# Patient Record
Sex: Male | Born: 1945
Health system: Southern US, Community
[De-identification: ages and names within clinical notes are randomized; demographics above are authoritative.]

## PROBLEM LIST (undated history)

## (undated) DIAGNOSIS — F32A Depression, unspecified: Secondary | ICD-10-CM

## (undated) DIAGNOSIS — J45909 Unspecified asthma, uncomplicated: Secondary | ICD-10-CM

## (undated) DIAGNOSIS — I255 Ischemic cardiomyopathy: Secondary | ICD-10-CM

## (undated) DIAGNOSIS — Z789 Other specified health status: Secondary | ICD-10-CM

## (undated) DIAGNOSIS — N183 Chronic kidney disease, stage 3 unspecified: Secondary | ICD-10-CM

## (undated) DIAGNOSIS — R7303 Prediabetes: Secondary | ICD-10-CM

## (undated) DIAGNOSIS — D303 Benign neoplasm of bladder: Secondary | ICD-10-CM

## (undated) HISTORY — PX: TONSILLECTOMY: SUR1361

## (undated) HISTORY — PX: FOOT SURGERY: SHX648

## (undated) HISTORY — PX: CORONARY ANGIOPLASTY WITH STENT PLACEMENT: SHX49

## (undated) HISTORY — DX: Ischemic cardiomyopathy: I25.5

---

## 2000-01-02 DIAGNOSIS — I951 Orthostatic hypotension: Secondary | ICD-10-CM

## 2000-01-02 DIAGNOSIS — I2510 Atherosclerotic heart disease of native coronary artery without angina pectoris: Secondary | ICD-10-CM

## 2000-01-02 DIAGNOSIS — I219 Acute myocardial infarction, unspecified: Secondary | ICD-10-CM

## 2000-01-02 HISTORY — DX: Acute myocardial infarction, unspecified: I21.9

## 2000-01-02 HISTORY — DX: Orthostatic hypotension: I95.1

## 2000-01-02 HISTORY — DX: Atherosclerotic heart disease of native coronary artery without angina pectoris: I25.10

## 2007-08-01 DIAGNOSIS — M25569 Pain in unspecified knee: Secondary | ICD-10-CM | POA: Insufficient documentation

## 2016-01-04 DIAGNOSIS — J3089 Other allergic rhinitis: Secondary | ICD-10-CM | POA: Diagnosis not present

## 2016-01-04 DIAGNOSIS — J301 Allergic rhinitis due to pollen: Secondary | ICD-10-CM | POA: Diagnosis not present

## 2016-01-04 DIAGNOSIS — J3081 Allergic rhinitis due to animal (cat) (dog) hair and dander: Secondary | ICD-10-CM | POA: Diagnosis not present

## 2016-01-05 DIAGNOSIS — M1711 Unilateral primary osteoarthritis, right knee: Secondary | ICD-10-CM | POA: Diagnosis not present

## 2016-01-13 DIAGNOSIS — T63461D Toxic effect of venom of wasps, accidental (unintentional), subsequent encounter: Secondary | ICD-10-CM | POA: Diagnosis not present

## 2016-01-13 DIAGNOSIS — M1711 Unilateral primary osteoarthritis, right knee: Secondary | ICD-10-CM | POA: Diagnosis not present

## 2016-01-13 DIAGNOSIS — J3089 Other allergic rhinitis: Secondary | ICD-10-CM | POA: Diagnosis not present

## 2016-01-13 DIAGNOSIS — J301 Allergic rhinitis due to pollen: Secondary | ICD-10-CM | POA: Diagnosis not present

## 2016-01-13 DIAGNOSIS — T63451D Toxic effect of venom of hornets, accidental (unintentional), subsequent encounter: Secondary | ICD-10-CM | POA: Diagnosis not present

## 2016-01-13 DIAGNOSIS — J3081 Allergic rhinitis due to animal (cat) (dog) hair and dander: Secondary | ICD-10-CM | POA: Diagnosis not present

## 2016-01-19 DIAGNOSIS — M1711 Unilateral primary osteoarthritis, right knee: Secondary | ICD-10-CM | POA: Diagnosis not present

## 2016-01-26 DIAGNOSIS — M1711 Unilateral primary osteoarthritis, right knee: Secondary | ICD-10-CM | POA: Diagnosis not present

## 2016-01-30 DIAGNOSIS — J3081 Allergic rhinitis due to animal (cat) (dog) hair and dander: Secondary | ICD-10-CM | POA: Diagnosis not present

## 2016-01-30 DIAGNOSIS — J3089 Other allergic rhinitis: Secondary | ICD-10-CM | POA: Diagnosis not present

## 2016-01-30 DIAGNOSIS — T63451D Toxic effect of venom of hornets, accidental (unintentional), subsequent encounter: Secondary | ICD-10-CM | POA: Diagnosis not present

## 2016-01-30 DIAGNOSIS — T63461D Toxic effect of venom of wasps, accidental (unintentional), subsequent encounter: Secondary | ICD-10-CM | POA: Diagnosis not present

## 2016-01-30 DIAGNOSIS — J301 Allergic rhinitis due to pollen: Secondary | ICD-10-CM | POA: Diagnosis not present

## 2016-02-07 DIAGNOSIS — J3089 Other allergic rhinitis: Secondary | ICD-10-CM | POA: Diagnosis not present

## 2016-02-07 DIAGNOSIS — J301 Allergic rhinitis due to pollen: Secondary | ICD-10-CM | POA: Diagnosis not present

## 2016-02-07 DIAGNOSIS — J3081 Allergic rhinitis due to animal (cat) (dog) hair and dander: Secondary | ICD-10-CM | POA: Diagnosis not present

## 2016-02-14 DIAGNOSIS — T63451D Toxic effect of venom of hornets, accidental (unintentional), subsequent encounter: Secondary | ICD-10-CM | POA: Diagnosis not present

## 2016-02-14 DIAGNOSIS — J301 Allergic rhinitis due to pollen: Secondary | ICD-10-CM | POA: Diagnosis not present

## 2016-02-14 DIAGNOSIS — J3089 Other allergic rhinitis: Secondary | ICD-10-CM | POA: Diagnosis not present

## 2016-02-14 DIAGNOSIS — J3081 Allergic rhinitis due to animal (cat) (dog) hair and dander: Secondary | ICD-10-CM | POA: Diagnosis not present

## 2016-02-14 DIAGNOSIS — T63461D Toxic effect of venom of wasps, accidental (unintentional), subsequent encounter: Secondary | ICD-10-CM | POA: Diagnosis not present

## 2016-03-08 DIAGNOSIS — T63461D Toxic effect of venom of wasps, accidental (unintentional), subsequent encounter: Secondary | ICD-10-CM | POA: Diagnosis not present

## 2016-03-08 DIAGNOSIS — J301 Allergic rhinitis due to pollen: Secondary | ICD-10-CM | POA: Diagnosis not present

## 2016-03-08 DIAGNOSIS — J3089 Other allergic rhinitis: Secondary | ICD-10-CM | POA: Diagnosis not present

## 2016-03-08 DIAGNOSIS — J3081 Allergic rhinitis due to animal (cat) (dog) hair and dander: Secondary | ICD-10-CM | POA: Diagnosis not present

## 2016-03-08 DIAGNOSIS — T63451D Toxic effect of venom of hornets, accidental (unintentional), subsequent encounter: Secondary | ICD-10-CM | POA: Diagnosis not present

## 2016-03-13 DIAGNOSIS — T63451D Toxic effect of venom of hornets, accidental (unintentional), subsequent encounter: Secondary | ICD-10-CM | POA: Diagnosis not present

## 2016-03-13 DIAGNOSIS — T63461D Toxic effect of venom of wasps, accidental (unintentional), subsequent encounter: Secondary | ICD-10-CM | POA: Diagnosis not present

## 2016-03-14 DIAGNOSIS — E291 Testicular hypofunction: Secondary | ICD-10-CM | POA: Diagnosis not present

## 2016-03-14 DIAGNOSIS — I2510 Atherosclerotic heart disease of native coronary artery without angina pectoris: Secondary | ICD-10-CM | POA: Diagnosis not present

## 2016-03-14 DIAGNOSIS — F329 Major depressive disorder, single episode, unspecified: Secondary | ICD-10-CM | POA: Diagnosis not present

## 2016-03-14 DIAGNOSIS — N183 Chronic kidney disease, stage 3 (moderate): Secondary | ICD-10-CM | POA: Diagnosis not present

## 2016-03-15 DIAGNOSIS — J3089 Other allergic rhinitis: Secondary | ICD-10-CM | POA: Diagnosis not present

## 2016-03-15 DIAGNOSIS — J301 Allergic rhinitis due to pollen: Secondary | ICD-10-CM | POA: Diagnosis not present

## 2016-03-15 DIAGNOSIS — T63461D Toxic effect of venom of wasps, accidental (unintentional), subsequent encounter: Secondary | ICD-10-CM | POA: Diagnosis not present

## 2016-03-15 DIAGNOSIS — T63451D Toxic effect of venom of hornets, accidental (unintentional), subsequent encounter: Secondary | ICD-10-CM | POA: Diagnosis not present

## 2016-03-15 DIAGNOSIS — J3081 Allergic rhinitis due to animal (cat) (dog) hair and dander: Secondary | ICD-10-CM | POA: Diagnosis not present

## 2016-04-04 DIAGNOSIS — F329 Major depressive disorder, single episode, unspecified: Secondary | ICD-10-CM | POA: Diagnosis not present

## 2016-04-04 DIAGNOSIS — E291 Testicular hypofunction: Secondary | ICD-10-CM | POA: Diagnosis not present

## 2016-04-04 DIAGNOSIS — D126 Benign neoplasm of colon, unspecified: Secondary | ICD-10-CM | POA: Diagnosis not present

## 2016-04-04 DIAGNOSIS — G609 Hereditary and idiopathic neuropathy, unspecified: Secondary | ICD-10-CM | POA: Diagnosis not present

## 2016-04-04 DIAGNOSIS — N183 Chronic kidney disease, stage 3 (moderate): Secondary | ICD-10-CM | POA: Diagnosis not present

## 2016-04-04 DIAGNOSIS — E782 Mixed hyperlipidemia: Secondary | ICD-10-CM | POA: Diagnosis not present

## 2016-04-04 DIAGNOSIS — I2510 Atherosclerotic heart disease of native coronary artery without angina pectoris: Secondary | ICD-10-CM | POA: Diagnosis not present

## 2016-04-04 DIAGNOSIS — G4733 Obstructive sleep apnea (adult) (pediatric): Secondary | ICD-10-CM | POA: Diagnosis not present

## 2016-04-05 DIAGNOSIS — N411 Chronic prostatitis: Secondary | ICD-10-CM | POA: Diagnosis not present

## 2016-04-05 DIAGNOSIS — N401 Enlarged prostate with lower urinary tract symptoms: Secondary | ICD-10-CM | POA: Diagnosis not present

## 2016-04-09 DIAGNOSIS — N419 Inflammatory disease of prostate, unspecified: Secondary | ICD-10-CM | POA: Diagnosis not present

## 2016-04-09 DIAGNOSIS — R5381 Other malaise: Secondary | ICD-10-CM | POA: Diagnosis not present

## 2016-04-09 DIAGNOSIS — R5383 Other fatigue: Secondary | ICD-10-CM | POA: Diagnosis not present

## 2016-04-09 DIAGNOSIS — I951 Orthostatic hypotension: Secondary | ICD-10-CM | POA: Diagnosis not present

## 2016-04-09 DIAGNOSIS — N179 Acute kidney failure, unspecified: Secondary | ICD-10-CM | POA: Diagnosis not present

## 2016-04-26 DIAGNOSIS — N401 Enlarged prostate with lower urinary tract symptoms: Secondary | ICD-10-CM | POA: Diagnosis not present

## 2016-04-26 DIAGNOSIS — N411 Chronic prostatitis: Secondary | ICD-10-CM | POA: Diagnosis not present

## 2016-05-03 DIAGNOSIS — N529 Male erectile dysfunction, unspecified: Secondary | ICD-10-CM | POA: Diagnosis not present

## 2016-05-03 DIAGNOSIS — I2510 Atherosclerotic heart disease of native coronary artery without angina pectoris: Secondary | ICD-10-CM | POA: Diagnosis not present

## 2016-05-03 DIAGNOSIS — E782 Mixed hyperlipidemia: Secondary | ICD-10-CM | POA: Diagnosis not present

## 2016-05-03 DIAGNOSIS — N183 Chronic kidney disease, stage 3 (moderate): Secondary | ICD-10-CM | POA: Diagnosis not present

## 2016-05-15 DIAGNOSIS — H25813 Combined forms of age-related cataract, bilateral: Secondary | ICD-10-CM | POA: Diagnosis not present

## 2016-05-15 DIAGNOSIS — J3081 Allergic rhinitis due to animal (cat) (dog) hair and dander: Secondary | ICD-10-CM | POA: Diagnosis not present

## 2016-05-15 DIAGNOSIS — J301 Allergic rhinitis due to pollen: Secondary | ICD-10-CM | POA: Diagnosis not present

## 2016-05-15 DIAGNOSIS — H33321 Round hole, right eye: Secondary | ICD-10-CM | POA: Diagnosis not present

## 2016-05-15 DIAGNOSIS — T63461D Toxic effect of venom of wasps, accidental (unintentional), subsequent encounter: Secondary | ICD-10-CM | POA: Diagnosis not present

## 2016-05-15 DIAGNOSIS — T63451D Toxic effect of venom of hornets, accidental (unintentional), subsequent encounter: Secondary | ICD-10-CM | POA: Diagnosis not present

## 2016-05-15 DIAGNOSIS — J3089 Other allergic rhinitis: Secondary | ICD-10-CM | POA: Diagnosis not present

## 2016-05-18 DIAGNOSIS — J301 Allergic rhinitis due to pollen: Secondary | ICD-10-CM | POA: Diagnosis not present

## 2016-05-18 DIAGNOSIS — J3089 Other allergic rhinitis: Secondary | ICD-10-CM | POA: Diagnosis not present

## 2016-05-18 DIAGNOSIS — T63461D Toxic effect of venom of wasps, accidental (unintentional), subsequent encounter: Secondary | ICD-10-CM | POA: Diagnosis not present

## 2016-05-18 DIAGNOSIS — J3081 Allergic rhinitis due to animal (cat) (dog) hair and dander: Secondary | ICD-10-CM | POA: Diagnosis not present

## 2016-05-18 DIAGNOSIS — T63451D Toxic effect of venom of hornets, accidental (unintentional), subsequent encounter: Secondary | ICD-10-CM | POA: Diagnosis not present

## 2016-05-25 DIAGNOSIS — J3081 Allergic rhinitis due to animal (cat) (dog) hair and dander: Secondary | ICD-10-CM | POA: Diagnosis not present

## 2016-05-25 DIAGNOSIS — J3089 Other allergic rhinitis: Secondary | ICD-10-CM | POA: Diagnosis not present

## 2016-05-25 DIAGNOSIS — J301 Allergic rhinitis due to pollen: Secondary | ICD-10-CM | POA: Diagnosis not present

## 2016-05-30 DIAGNOSIS — J301 Allergic rhinitis due to pollen: Secondary | ICD-10-CM | POA: Diagnosis not present

## 2016-05-30 DIAGNOSIS — J3081 Allergic rhinitis due to animal (cat) (dog) hair and dander: Secondary | ICD-10-CM | POA: Diagnosis not present

## 2016-05-30 DIAGNOSIS — J3089 Other allergic rhinitis: Secondary | ICD-10-CM | POA: Diagnosis not present

## 2016-05-31 DIAGNOSIS — Z87891 Personal history of nicotine dependence: Secondary | ICD-10-CM | POA: Diagnosis not present

## 2016-05-31 DIAGNOSIS — I252 Old myocardial infarction: Secondary | ICD-10-CM | POA: Diagnosis not present

## 2016-05-31 DIAGNOSIS — N419 Inflammatory disease of prostate, unspecified: Secondary | ICD-10-CM | POA: Diagnosis not present

## 2016-05-31 DIAGNOSIS — R3912 Poor urinary stream: Secondary | ICD-10-CM | POA: Diagnosis not present

## 2016-05-31 DIAGNOSIS — N411 Chronic prostatitis: Secondary | ICD-10-CM | POA: Diagnosis not present

## 2016-05-31 DIAGNOSIS — Z8551 Personal history of malignant neoplasm of bladder: Secondary | ICD-10-CM | POA: Diagnosis not present

## 2016-05-31 DIAGNOSIS — N138 Other obstructive and reflux uropathy: Secondary | ICD-10-CM | POA: Diagnosis not present

## 2016-05-31 DIAGNOSIS — E7800 Pure hypercholesterolemia, unspecified: Secondary | ICD-10-CM | POA: Diagnosis not present

## 2016-05-31 DIAGNOSIS — N401 Enlarged prostate with lower urinary tract symptoms: Secondary | ICD-10-CM | POA: Diagnosis not present

## 2016-06-01 DIAGNOSIS — N411 Chronic prostatitis: Secondary | ICD-10-CM | POA: Diagnosis not present

## 2016-06-01 DIAGNOSIS — N138 Other obstructive and reflux uropathy: Secondary | ICD-10-CM | POA: Diagnosis not present

## 2016-06-01 DIAGNOSIS — E7800 Pure hypercholesterolemia, unspecified: Secondary | ICD-10-CM | POA: Diagnosis not present

## 2016-06-01 DIAGNOSIS — Z8551 Personal history of malignant neoplasm of bladder: Secondary | ICD-10-CM | POA: Diagnosis not present

## 2016-06-01 DIAGNOSIS — N401 Enlarged prostate with lower urinary tract symptoms: Secondary | ICD-10-CM | POA: Diagnosis not present

## 2016-06-01 DIAGNOSIS — R3912 Poor urinary stream: Secondary | ICD-10-CM | POA: Diagnosis not present

## 2016-06-05 DIAGNOSIS — T63461D Toxic effect of venom of wasps, accidental (unintentional), subsequent encounter: Secondary | ICD-10-CM | POA: Diagnosis not present

## 2016-06-05 DIAGNOSIS — J3081 Allergic rhinitis due to animal (cat) (dog) hair and dander: Secondary | ICD-10-CM | POA: Diagnosis not present

## 2016-06-05 DIAGNOSIS — T63451D Toxic effect of venom of hornets, accidental (unintentional), subsequent encounter: Secondary | ICD-10-CM | POA: Diagnosis not present

## 2016-06-05 DIAGNOSIS — J301 Allergic rhinitis due to pollen: Secondary | ICD-10-CM | POA: Diagnosis not present

## 2016-06-05 DIAGNOSIS — J3089 Other allergic rhinitis: Secondary | ICD-10-CM | POA: Diagnosis not present

## 2016-06-07 DIAGNOSIS — T63451D Toxic effect of venom of hornets, accidental (unintentional), subsequent encounter: Secondary | ICD-10-CM | POA: Diagnosis not present

## 2016-06-07 DIAGNOSIS — J3089 Other allergic rhinitis: Secondary | ICD-10-CM | POA: Diagnosis not present

## 2016-06-07 DIAGNOSIS — T63461D Toxic effect of venom of wasps, accidental (unintentional), subsequent encounter: Secondary | ICD-10-CM | POA: Diagnosis not present

## 2016-06-07 DIAGNOSIS — J3081 Allergic rhinitis due to animal (cat) (dog) hair and dander: Secondary | ICD-10-CM | POA: Diagnosis not present

## 2016-06-07 DIAGNOSIS — J301 Allergic rhinitis due to pollen: Secondary | ICD-10-CM | POA: Diagnosis not present

## 2016-07-05 DIAGNOSIS — J3081 Allergic rhinitis due to animal (cat) (dog) hair and dander: Secondary | ICD-10-CM | POA: Diagnosis not present

## 2016-07-05 DIAGNOSIS — J301 Allergic rhinitis due to pollen: Secondary | ICD-10-CM | POA: Diagnosis not present

## 2016-07-05 DIAGNOSIS — R030 Elevated blood-pressure reading, without diagnosis of hypertension: Secondary | ICD-10-CM | POA: Diagnosis not present

## 2016-07-05 DIAGNOSIS — T63451D Toxic effect of venom of hornets, accidental (unintentional), subsequent encounter: Secondary | ICD-10-CM | POA: Diagnosis not present

## 2016-07-05 DIAGNOSIS — S0012XA Contusion of left eyelid and periocular area, initial encounter: Secondary | ICD-10-CM | POA: Diagnosis not present

## 2016-07-05 DIAGNOSIS — J3089 Other allergic rhinitis: Secondary | ICD-10-CM | POA: Diagnosis not present

## 2016-07-05 DIAGNOSIS — T63461D Toxic effect of venom of wasps, accidental (unintentional), subsequent encounter: Secondary | ICD-10-CM | POA: Diagnosis not present

## 2016-07-17 DIAGNOSIS — J301 Allergic rhinitis due to pollen: Secondary | ICD-10-CM | POA: Diagnosis not present

## 2016-07-17 DIAGNOSIS — J3089 Other allergic rhinitis: Secondary | ICD-10-CM | POA: Diagnosis not present

## 2016-07-17 DIAGNOSIS — J3081 Allergic rhinitis due to animal (cat) (dog) hair and dander: Secondary | ICD-10-CM | POA: Diagnosis not present

## 2016-07-31 DIAGNOSIS — T63451D Toxic effect of venom of hornets, accidental (unintentional), subsequent encounter: Secondary | ICD-10-CM | POA: Diagnosis not present

## 2016-07-31 DIAGNOSIS — J301 Allergic rhinitis due to pollen: Secondary | ICD-10-CM | POA: Diagnosis not present

## 2016-07-31 DIAGNOSIS — T63461D Toxic effect of venom of wasps, accidental (unintentional), subsequent encounter: Secondary | ICD-10-CM | POA: Diagnosis not present

## 2016-07-31 DIAGNOSIS — J4520 Mild intermittent asthma, uncomplicated: Secondary | ICD-10-CM | POA: Diagnosis not present

## 2016-07-31 DIAGNOSIS — J3089 Other allergic rhinitis: Secondary | ICD-10-CM | POA: Diagnosis not present

## 2016-07-31 DIAGNOSIS — J3081 Allergic rhinitis due to animal (cat) (dog) hair and dander: Secondary | ICD-10-CM | POA: Diagnosis not present

## 2016-08-22 DIAGNOSIS — R030 Elevated blood-pressure reading, without diagnosis of hypertension: Secondary | ICD-10-CM | POA: Diagnosis not present

## 2016-08-22 DIAGNOSIS — F329 Major depressive disorder, single episode, unspecified: Secondary | ICD-10-CM | POA: Diagnosis not present

## 2016-08-29 DIAGNOSIS — J3081 Allergic rhinitis due to animal (cat) (dog) hair and dander: Secondary | ICD-10-CM | POA: Diagnosis not present

## 2016-08-29 DIAGNOSIS — T63451D Toxic effect of venom of hornets, accidental (unintentional), subsequent encounter: Secondary | ICD-10-CM | POA: Diagnosis not present

## 2016-08-29 DIAGNOSIS — J3089 Other allergic rhinitis: Secondary | ICD-10-CM | POA: Diagnosis not present

## 2016-08-29 DIAGNOSIS — T63461D Toxic effect of venom of wasps, accidental (unintentional), subsequent encounter: Secondary | ICD-10-CM | POA: Diagnosis not present

## 2016-08-29 DIAGNOSIS — J301 Allergic rhinitis due to pollen: Secondary | ICD-10-CM | POA: Diagnosis not present

## 2016-09-11 DIAGNOSIS — J301 Allergic rhinitis due to pollen: Secondary | ICD-10-CM | POA: Diagnosis not present

## 2016-09-11 DIAGNOSIS — T63461D Toxic effect of venom of wasps, accidental (unintentional), subsequent encounter: Secondary | ICD-10-CM | POA: Diagnosis not present

## 2016-09-11 DIAGNOSIS — T63451D Toxic effect of venom of hornets, accidental (unintentional), subsequent encounter: Secondary | ICD-10-CM | POA: Diagnosis not present

## 2016-09-11 DIAGNOSIS — J3089 Other allergic rhinitis: Secondary | ICD-10-CM | POA: Diagnosis not present

## 2016-09-11 DIAGNOSIS — J3081 Allergic rhinitis due to animal (cat) (dog) hair and dander: Secondary | ICD-10-CM | POA: Diagnosis not present

## 2016-09-13 DIAGNOSIS — J301 Allergic rhinitis due to pollen: Secondary | ICD-10-CM | POA: Diagnosis not present

## 2016-09-13 DIAGNOSIS — J3089 Other allergic rhinitis: Secondary | ICD-10-CM | POA: Diagnosis not present

## 2016-09-13 DIAGNOSIS — J3081 Allergic rhinitis due to animal (cat) (dog) hair and dander: Secondary | ICD-10-CM | POA: Diagnosis not present

## 2016-09-13 DIAGNOSIS — T63451D Toxic effect of venom of hornets, accidental (unintentional), subsequent encounter: Secondary | ICD-10-CM | POA: Diagnosis not present

## 2016-09-13 DIAGNOSIS — T63461D Toxic effect of venom of wasps, accidental (unintentional), subsequent encounter: Secondary | ICD-10-CM | POA: Diagnosis not present

## 2016-09-21 DIAGNOSIS — J3089 Other allergic rhinitis: Secondary | ICD-10-CM | POA: Diagnosis not present

## 2016-09-21 DIAGNOSIS — J3081 Allergic rhinitis due to animal (cat) (dog) hair and dander: Secondary | ICD-10-CM | POA: Diagnosis not present

## 2016-09-21 DIAGNOSIS — J301 Allergic rhinitis due to pollen: Secondary | ICD-10-CM | POA: Diagnosis not present

## 2016-09-24 DIAGNOSIS — T63461D Toxic effect of venom of wasps, accidental (unintentional), subsequent encounter: Secondary | ICD-10-CM | POA: Diagnosis not present

## 2016-09-24 DIAGNOSIS — T63451D Toxic effect of venom of hornets, accidental (unintentional), subsequent encounter: Secondary | ICD-10-CM | POA: Diagnosis not present

## 2016-10-01 DIAGNOSIS — Z955 Presence of coronary angioplasty implant and graft: Secondary | ICD-10-CM | POA: Diagnosis not present

## 2016-10-01 DIAGNOSIS — G4733 Obstructive sleep apnea (adult) (pediatric): Secondary | ICD-10-CM | POA: Diagnosis not present

## 2016-10-01 DIAGNOSIS — N183 Chronic kidney disease, stage 3 (moderate): Secondary | ICD-10-CM | POA: Diagnosis not present

## 2016-10-01 DIAGNOSIS — Z79899 Other long term (current) drug therapy: Secondary | ICD-10-CM | POA: Diagnosis not present

## 2016-10-01 DIAGNOSIS — I2510 Atherosclerotic heart disease of native coronary artery without angina pectoris: Secondary | ICD-10-CM | POA: Diagnosis not present

## 2016-10-01 DIAGNOSIS — T7840XA Allergy, unspecified, initial encounter: Secondary | ICD-10-CM | POA: Diagnosis not present

## 2016-10-01 DIAGNOSIS — I252 Old myocardial infarction: Secondary | ICD-10-CM | POA: Diagnosis not present

## 2016-10-01 DIAGNOSIS — Z87891 Personal history of nicotine dependence: Secondary | ICD-10-CM | POA: Diagnosis not present

## 2016-10-01 DIAGNOSIS — E782 Mixed hyperlipidemia: Secondary | ICD-10-CM | POA: Diagnosis not present

## 2016-10-01 DIAGNOSIS — L509 Urticaria, unspecified: Secondary | ICD-10-CM | POA: Diagnosis not present

## 2016-10-01 DIAGNOSIS — E291 Testicular hypofunction: Secondary | ICD-10-CM | POA: Diagnosis not present

## 2016-10-10 DIAGNOSIS — C61 Malignant neoplasm of prostate: Secondary | ICD-10-CM | POA: Diagnosis not present

## 2016-10-10 DIAGNOSIS — Z1159 Encounter for screening for other viral diseases: Secondary | ICD-10-CM | POA: Diagnosis not present

## 2016-10-10 DIAGNOSIS — N183 Chronic kidney disease, stage 3 (moderate): Secondary | ICD-10-CM | POA: Diagnosis not present

## 2016-10-10 DIAGNOSIS — Z0000 Encounter for general adult medical examination without abnormal findings: Secondary | ICD-10-CM | POA: Diagnosis not present

## 2016-10-10 DIAGNOSIS — E782 Mixed hyperlipidemia: Secondary | ICD-10-CM | POA: Diagnosis not present

## 2016-10-10 DIAGNOSIS — G4733 Obstructive sleep apnea (adult) (pediatric): Secondary | ICD-10-CM | POA: Diagnosis not present

## 2016-10-10 DIAGNOSIS — R7301 Impaired fasting glucose: Secondary | ICD-10-CM | POA: Diagnosis not present

## 2016-10-10 DIAGNOSIS — E291 Testicular hypofunction: Secondary | ICD-10-CM | POA: Diagnosis not present

## 2016-10-10 DIAGNOSIS — F329 Major depressive disorder, single episode, unspecified: Secondary | ICD-10-CM | POA: Diagnosis not present

## 2016-10-10 DIAGNOSIS — I2510 Atherosclerotic heart disease of native coronary artery without angina pectoris: Secondary | ICD-10-CM | POA: Diagnosis not present

## 2016-10-16 DIAGNOSIS — N410 Acute prostatitis: Secondary | ICD-10-CM | POA: Diagnosis not present

## 2016-10-16 DIAGNOSIS — N401 Enlarged prostate with lower urinary tract symptoms: Secondary | ICD-10-CM | POA: Diagnosis not present

## 2016-10-16 DIAGNOSIS — J301 Allergic rhinitis due to pollen: Secondary | ICD-10-CM | POA: Diagnosis not present

## 2016-10-16 DIAGNOSIS — J3089 Other allergic rhinitis: Secondary | ICD-10-CM | POA: Diagnosis not present

## 2016-10-16 DIAGNOSIS — Z125 Encounter for screening for malignant neoplasm of prostate: Secondary | ICD-10-CM | POA: Diagnosis not present

## 2016-10-16 DIAGNOSIS — J3081 Allergic rhinitis due to animal (cat) (dog) hair and dander: Secondary | ICD-10-CM | POA: Diagnosis not present

## 2016-10-31 DIAGNOSIS — T63451D Toxic effect of venom of hornets, accidental (unintentional), subsequent encounter: Secondary | ICD-10-CM | POA: Diagnosis not present

## 2016-10-31 DIAGNOSIS — J301 Allergic rhinitis due to pollen: Secondary | ICD-10-CM | POA: Diagnosis not present

## 2016-10-31 DIAGNOSIS — J3081 Allergic rhinitis due to animal (cat) (dog) hair and dander: Secondary | ICD-10-CM | POA: Diagnosis not present

## 2016-10-31 DIAGNOSIS — J3089 Other allergic rhinitis: Secondary | ICD-10-CM | POA: Diagnosis not present

## 2016-10-31 DIAGNOSIS — T63461D Toxic effect of venom of wasps, accidental (unintentional), subsequent encounter: Secondary | ICD-10-CM | POA: Diagnosis not present

## 2016-11-20 DIAGNOSIS — T63461D Toxic effect of venom of wasps, accidental (unintentional), subsequent encounter: Secondary | ICD-10-CM | POA: Diagnosis not present

## 2016-11-20 DIAGNOSIS — J3089 Other allergic rhinitis: Secondary | ICD-10-CM | POA: Diagnosis not present

## 2016-11-20 DIAGNOSIS — J3081 Allergic rhinitis due to animal (cat) (dog) hair and dander: Secondary | ICD-10-CM | POA: Diagnosis not present

## 2016-11-20 DIAGNOSIS — T63451D Toxic effect of venom of hornets, accidental (unintentional), subsequent encounter: Secondary | ICD-10-CM | POA: Diagnosis not present

## 2016-11-20 DIAGNOSIS — J301 Allergic rhinitis due to pollen: Secondary | ICD-10-CM | POA: Diagnosis not present

## 2016-11-21 DIAGNOSIS — J3089 Other allergic rhinitis: Secondary | ICD-10-CM | POA: Diagnosis not present

## 2016-11-21 DIAGNOSIS — J3081 Allergic rhinitis due to animal (cat) (dog) hair and dander: Secondary | ICD-10-CM | POA: Diagnosis not present

## 2016-11-21 DIAGNOSIS — J301 Allergic rhinitis due to pollen: Secondary | ICD-10-CM | POA: Diagnosis not present

## 2016-11-28 DIAGNOSIS — J3081 Allergic rhinitis due to animal (cat) (dog) hair and dander: Secondary | ICD-10-CM | POA: Diagnosis not present

## 2016-11-28 DIAGNOSIS — J3089 Other allergic rhinitis: Secondary | ICD-10-CM | POA: Diagnosis not present

## 2016-11-28 DIAGNOSIS — T63461D Toxic effect of venom of wasps, accidental (unintentional), subsequent encounter: Secondary | ICD-10-CM | POA: Diagnosis not present

## 2016-11-28 DIAGNOSIS — J301 Allergic rhinitis due to pollen: Secondary | ICD-10-CM | POA: Diagnosis not present

## 2016-11-28 DIAGNOSIS — T63451D Toxic effect of venom of hornets, accidental (unintentional), subsequent encounter: Secondary | ICD-10-CM | POA: Diagnosis not present

## 2016-12-14 DIAGNOSIS — T63451D Toxic effect of venom of hornets, accidental (unintentional), subsequent encounter: Secondary | ICD-10-CM | POA: Diagnosis not present

## 2016-12-14 DIAGNOSIS — J3081 Allergic rhinitis due to animal (cat) (dog) hair and dander: Secondary | ICD-10-CM | POA: Diagnosis not present

## 2016-12-14 DIAGNOSIS — T63461D Toxic effect of venom of wasps, accidental (unintentional), subsequent encounter: Secondary | ICD-10-CM | POA: Diagnosis not present

## 2016-12-14 DIAGNOSIS — J301 Allergic rhinitis due to pollen: Secondary | ICD-10-CM | POA: Diagnosis not present

## 2016-12-14 DIAGNOSIS — J3089 Other allergic rhinitis: Secondary | ICD-10-CM | POA: Diagnosis not present

## 2016-12-20 DIAGNOSIS — T63461D Toxic effect of venom of wasps, accidental (unintentional), subsequent encounter: Secondary | ICD-10-CM | POA: Diagnosis not present

## 2016-12-20 DIAGNOSIS — J3081 Allergic rhinitis due to animal (cat) (dog) hair and dander: Secondary | ICD-10-CM | POA: Diagnosis not present

## 2016-12-20 DIAGNOSIS — T63451D Toxic effect of venom of hornets, accidental (unintentional), subsequent encounter: Secondary | ICD-10-CM | POA: Diagnosis not present

## 2016-12-20 DIAGNOSIS — J3089 Other allergic rhinitis: Secondary | ICD-10-CM | POA: Diagnosis not present

## 2016-12-20 DIAGNOSIS — J301 Allergic rhinitis due to pollen: Secondary | ICD-10-CM | POA: Diagnosis not present

## 2017-01-23 DIAGNOSIS — L821 Other seborrheic keratosis: Secondary | ICD-10-CM | POA: Diagnosis not present

## 2017-01-23 DIAGNOSIS — Z872 Personal history of diseases of the skin and subcutaneous tissue: Secondary | ICD-10-CM | POA: Diagnosis not present

## 2017-01-23 DIAGNOSIS — L280 Lichen simplex chronicus: Secondary | ICD-10-CM | POA: Diagnosis not present

## 2017-01-23 DIAGNOSIS — D485 Neoplasm of uncertain behavior of skin: Secondary | ICD-10-CM | POA: Diagnosis not present

## 2017-01-23 DIAGNOSIS — L110 Acquired keratosis follicularis: Secondary | ICD-10-CM | POA: Diagnosis not present

## 2017-01-23 DIAGNOSIS — L853 Xerosis cutis: Secondary | ICD-10-CM | POA: Diagnosis not present

## 2017-01-23 DIAGNOSIS — D235 Other benign neoplasm of skin of trunk: Secondary | ICD-10-CM | POA: Diagnosis not present

## 2017-01-23 DIAGNOSIS — L570 Actinic keratosis: Secondary | ICD-10-CM | POA: Diagnosis not present

## 2017-01-23 DIAGNOSIS — Z09 Encounter for follow-up examination after completed treatment for conditions other than malignant neoplasm: Secondary | ICD-10-CM | POA: Diagnosis not present

## 2017-01-24 DIAGNOSIS — N451 Epididymitis: Secondary | ICD-10-CM | POA: Diagnosis not present

## 2017-01-24 DIAGNOSIS — N411 Chronic prostatitis: Secondary | ICD-10-CM | POA: Diagnosis not present

## 2017-01-24 DIAGNOSIS — N401 Enlarged prostate with lower urinary tract symptoms: Secondary | ICD-10-CM | POA: Diagnosis not present

## 2017-01-25 DIAGNOSIS — J069 Acute upper respiratory infection, unspecified: Secondary | ICD-10-CM | POA: Diagnosis not present

## 2017-04-25 DIAGNOSIS — X32XXXA Exposure to sunlight, initial encounter: Secondary | ICD-10-CM | POA: Diagnosis not present

## 2017-04-25 DIAGNOSIS — L859 Epidermal thickening, unspecified: Secondary | ICD-10-CM | POA: Diagnosis not present

## 2017-04-25 DIAGNOSIS — Z1283 Encounter for screening for malignant neoplasm of skin: Secondary | ICD-10-CM | POA: Diagnosis not present

## 2017-04-25 DIAGNOSIS — L820 Inflamed seborrheic keratosis: Secondary | ICD-10-CM | POA: Diagnosis not present

## 2017-04-25 DIAGNOSIS — L738 Other specified follicular disorders: Secondary | ICD-10-CM | POA: Diagnosis not present

## 2017-04-25 DIAGNOSIS — L570 Actinic keratosis: Secondary | ICD-10-CM | POA: Diagnosis not present

## 2017-04-25 DIAGNOSIS — D2339 Other benign neoplasm of skin of other parts of face: Secondary | ICD-10-CM | POA: Diagnosis not present

## 2017-05-08 DIAGNOSIS — E291 Testicular hypofunction: Secondary | ICD-10-CM | POA: Diagnosis not present

## 2017-05-08 DIAGNOSIS — I25110 Atherosclerotic heart disease of native coronary artery with unstable angina pectoris: Secondary | ICD-10-CM | POA: Diagnosis not present

## 2017-05-08 DIAGNOSIS — F331 Major depressive disorder, recurrent, moderate: Secondary | ICD-10-CM | POA: Diagnosis not present

## 2017-05-08 DIAGNOSIS — Z6836 Body mass index (BMI) 36.0-36.9, adult: Secondary | ICD-10-CM | POA: Diagnosis not present

## 2017-07-29 DIAGNOSIS — Z6836 Body mass index (BMI) 36.0-36.9, adult: Secondary | ICD-10-CM | POA: Diagnosis not present

## 2017-07-29 DIAGNOSIS — E291 Testicular hypofunction: Secondary | ICD-10-CM | POA: Diagnosis not present

## 2017-07-29 DIAGNOSIS — I25110 Atherosclerotic heart disease of native coronary artery with unstable angina pectoris: Secondary | ICD-10-CM | POA: Diagnosis not present

## 2017-07-29 DIAGNOSIS — F331 Major depressive disorder, recurrent, moderate: Secondary | ICD-10-CM | POA: Diagnosis not present

## 2017-08-19 DIAGNOSIS — F331 Major depressive disorder, recurrent, moderate: Secondary | ICD-10-CM | POA: Diagnosis not present

## 2017-08-19 DIAGNOSIS — E291 Testicular hypofunction: Secondary | ICD-10-CM | POA: Diagnosis not present

## 2017-08-19 DIAGNOSIS — I25110 Atherosclerotic heart disease of native coronary artery with unstable angina pectoris: Secondary | ICD-10-CM | POA: Diagnosis not present

## 2017-08-19 DIAGNOSIS — R7301 Impaired fasting glucose: Secondary | ICD-10-CM | POA: Diagnosis not present

## 2017-08-19 DIAGNOSIS — N183 Chronic kidney disease, stage 3 (moderate): Secondary | ICD-10-CM | POA: Diagnosis not present

## 2017-08-19 DIAGNOSIS — J45909 Unspecified asthma, uncomplicated: Secondary | ICD-10-CM | POA: Diagnosis not present

## 2017-08-19 DIAGNOSIS — Z6836 Body mass index (BMI) 36.0-36.9, adult: Secondary | ICD-10-CM | POA: Diagnosis not present

## 2017-08-19 DIAGNOSIS — R030 Elevated blood-pressure reading, without diagnosis of hypertension: Secondary | ICD-10-CM | POA: Diagnosis not present

## 2017-08-19 DIAGNOSIS — I2510 Atherosclerotic heart disease of native coronary artery without angina pectoris: Secondary | ICD-10-CM | POA: Diagnosis not present

## 2017-09-10 DIAGNOSIS — F331 Major depressive disorder, recurrent, moderate: Secondary | ICD-10-CM | POA: Diagnosis not present

## 2017-09-10 DIAGNOSIS — R030 Elevated blood-pressure reading, without diagnosis of hypertension: Secondary | ICD-10-CM | POA: Diagnosis not present

## 2017-09-10 DIAGNOSIS — L918 Other hypertrophic disorders of the skin: Secondary | ICD-10-CM | POA: Diagnosis not present

## 2017-09-10 DIAGNOSIS — Z6831 Body mass index (BMI) 31.0-31.9, adult: Secondary | ICD-10-CM | POA: Diagnosis not present

## 2017-10-29 DIAGNOSIS — J019 Acute sinusitis, unspecified: Secondary | ICD-10-CM | POA: Diagnosis not present

## 2017-10-29 DIAGNOSIS — B353 Tinea pedis: Secondary | ICD-10-CM | POA: Diagnosis not present

## 2017-10-29 DIAGNOSIS — F331 Major depressive disorder, recurrent, moderate: Secondary | ICD-10-CM | POA: Diagnosis not present

## 2017-10-29 DIAGNOSIS — Z6831 Body mass index (BMI) 31.0-31.9, adult: Secondary | ICD-10-CM | POA: Diagnosis not present

## 2017-11-26 DIAGNOSIS — I25110 Atherosclerotic heart disease of native coronary artery with unstable angina pectoris: Secondary | ICD-10-CM | POA: Diagnosis not present

## 2017-11-26 DIAGNOSIS — J45909 Unspecified asthma, uncomplicated: Secondary | ICD-10-CM | POA: Diagnosis not present

## 2017-11-26 DIAGNOSIS — R030 Elevated blood-pressure reading, without diagnosis of hypertension: Secondary | ICD-10-CM | POA: Diagnosis not present

## 2017-11-26 DIAGNOSIS — J019 Acute sinusitis, unspecified: Secondary | ICD-10-CM | POA: Diagnosis not present

## 2017-11-26 DIAGNOSIS — R7301 Impaired fasting glucose: Secondary | ICD-10-CM | POA: Diagnosis not present

## 2017-11-26 DIAGNOSIS — B353 Tinea pedis: Secondary | ICD-10-CM | POA: Diagnosis not present

## 2017-11-26 DIAGNOSIS — E291 Testicular hypofunction: Secondary | ICD-10-CM | POA: Diagnosis not present

## 2017-11-26 DIAGNOSIS — N183 Chronic kidney disease, stage 3 (moderate): Secondary | ICD-10-CM | POA: Diagnosis not present

## 2017-11-26 DIAGNOSIS — Z6836 Body mass index (BMI) 36.0-36.9, adult: Secondary | ICD-10-CM | POA: Diagnosis not present

## 2017-11-26 DIAGNOSIS — I2510 Atherosclerotic heart disease of native coronary artery without angina pectoris: Secondary | ICD-10-CM | POA: Diagnosis not present

## 2017-11-26 DIAGNOSIS — F331 Major depressive disorder, recurrent, moderate: Secondary | ICD-10-CM | POA: Diagnosis not present

## 2017-11-26 DIAGNOSIS — Z6831 Body mass index (BMI) 31.0-31.9, adult: Secondary | ICD-10-CM | POA: Diagnosis not present

## 2017-12-10 DIAGNOSIS — M1711 Unilateral primary osteoarthritis, right knee: Secondary | ICD-10-CM | POA: Diagnosis not present

## 2017-12-30 DIAGNOSIS — E291 Testicular hypofunction: Secondary | ICD-10-CM | POA: Diagnosis not present

## 2017-12-30 DIAGNOSIS — I2510 Atherosclerotic heart disease of native coronary artery without angina pectoris: Secondary | ICD-10-CM | POA: Diagnosis not present

## 2017-12-30 DIAGNOSIS — E669 Obesity, unspecified: Secondary | ICD-10-CM | POA: Diagnosis not present

## 2017-12-30 DIAGNOSIS — Z6836 Body mass index (BMI) 36.0-36.9, adult: Secondary | ICD-10-CM | POA: Diagnosis not present

## 2017-12-30 DIAGNOSIS — Z6831 Body mass index (BMI) 31.0-31.9, adult: Secondary | ICD-10-CM | POA: Diagnosis not present

## 2017-12-30 DIAGNOSIS — N183 Chronic kidney disease, stage 3 (moderate): Secondary | ICD-10-CM | POA: Diagnosis not present

## 2017-12-30 DIAGNOSIS — J019 Acute sinusitis, unspecified: Secondary | ICD-10-CM | POA: Diagnosis not present

## 2017-12-30 DIAGNOSIS — I25110 Atherosclerotic heart disease of native coronary artery with unstable angina pectoris: Secondary | ICD-10-CM | POA: Diagnosis not present

## 2017-12-30 DIAGNOSIS — R7301 Impaired fasting glucose: Secondary | ICD-10-CM | POA: Diagnosis not present

## 2017-12-30 DIAGNOSIS — R030 Elevated blood-pressure reading, without diagnosis of hypertension: Secondary | ICD-10-CM | POA: Diagnosis not present

## 2017-12-30 DIAGNOSIS — B353 Tinea pedis: Secondary | ICD-10-CM | POA: Diagnosis not present

## 2017-12-30 DIAGNOSIS — J45909 Unspecified asthma, uncomplicated: Secondary | ICD-10-CM | POA: Diagnosis not present

## 2017-12-30 DIAGNOSIS — F331 Major depressive disorder, recurrent, moderate: Secondary | ICD-10-CM | POA: Diagnosis not present

## 2018-01-07 DIAGNOSIS — M1711 Unilateral primary osteoarthritis, right knee: Secondary | ICD-10-CM | POA: Diagnosis not present

## 2018-01-14 DIAGNOSIS — M1711 Unilateral primary osteoarthritis, right knee: Secondary | ICD-10-CM | POA: Diagnosis not present

## 2018-01-21 DIAGNOSIS — M1711 Unilateral primary osteoarthritis, right knee: Secondary | ICD-10-CM | POA: Diagnosis not present

## 2018-02-25 DIAGNOSIS — N183 Chronic kidney disease, stage 3 (moderate): Secondary | ICD-10-CM | POA: Diagnosis not present

## 2018-02-25 DIAGNOSIS — I25110 Atherosclerotic heart disease of native coronary artery with unstable angina pectoris: Secondary | ICD-10-CM | POA: Diagnosis not present

## 2018-02-25 DIAGNOSIS — R7301 Impaired fasting glucose: Secondary | ICD-10-CM | POA: Diagnosis not present

## 2018-02-25 DIAGNOSIS — E291 Testicular hypofunction: Secondary | ICD-10-CM | POA: Diagnosis not present

## 2018-02-25 DIAGNOSIS — E669 Obesity, unspecified: Secondary | ICD-10-CM | POA: Diagnosis not present

## 2018-02-28 DIAGNOSIS — R7301 Impaired fasting glucose: Secondary | ICD-10-CM | POA: Diagnosis not present

## 2018-02-28 DIAGNOSIS — E291 Testicular hypofunction: Secondary | ICD-10-CM | POA: Diagnosis not present

## 2018-02-28 DIAGNOSIS — J45909 Unspecified asthma, uncomplicated: Secondary | ICD-10-CM | POA: Diagnosis not present

## 2018-02-28 DIAGNOSIS — E782 Mixed hyperlipidemia: Secondary | ICD-10-CM | POA: Diagnosis not present

## 2018-02-28 DIAGNOSIS — I2510 Atherosclerotic heart disease of native coronary artery without angina pectoris: Secondary | ICD-10-CM | POA: Diagnosis not present

## 2018-02-28 DIAGNOSIS — N183 Chronic kidney disease, stage 3 (moderate): Secondary | ICD-10-CM | POA: Diagnosis not present

## 2018-02-28 DIAGNOSIS — F331 Major depressive disorder, recurrent, moderate: Secondary | ICD-10-CM | POA: Diagnosis not present

## 2018-02-28 DIAGNOSIS — E663 Overweight: Secondary | ICD-10-CM | POA: Diagnosis not present

## 2018-04-07 DIAGNOSIS — N411 Chronic prostatitis: Secondary | ICD-10-CM | POA: Diagnosis not present

## 2018-04-07 DIAGNOSIS — R350 Frequency of micturition: Secondary | ICD-10-CM | POA: Diagnosis not present

## 2018-04-07 DIAGNOSIS — N401 Enlarged prostate with lower urinary tract symptoms: Secondary | ICD-10-CM | POA: Diagnosis not present

## 2018-04-07 DIAGNOSIS — R3912 Poor urinary stream: Secondary | ICD-10-CM | POA: Diagnosis not present

## 2018-07-29 DIAGNOSIS — R21 Rash and other nonspecific skin eruption: Secondary | ICD-10-CM | POA: Diagnosis not present

## 2018-10-02 DIAGNOSIS — M9903 Segmental and somatic dysfunction of lumbar region: Secondary | ICD-10-CM | POA: Diagnosis not present

## 2018-10-02 DIAGNOSIS — M5137 Other intervertebral disc degeneration, lumbosacral region: Secondary | ICD-10-CM | POA: Diagnosis not present

## 2018-10-06 DIAGNOSIS — M5137 Other intervertebral disc degeneration, lumbosacral region: Secondary | ICD-10-CM | POA: Diagnosis not present

## 2018-10-06 DIAGNOSIS — M9903 Segmental and somatic dysfunction of lumbar region: Secondary | ICD-10-CM | POA: Diagnosis not present

## 2018-10-09 DIAGNOSIS — M9903 Segmental and somatic dysfunction of lumbar region: Secondary | ICD-10-CM | POA: Diagnosis not present

## 2018-10-09 DIAGNOSIS — M5137 Other intervertebral disc degeneration, lumbosacral region: Secondary | ICD-10-CM | POA: Diagnosis not present

## 2018-10-13 DIAGNOSIS — M9903 Segmental and somatic dysfunction of lumbar region: Secondary | ICD-10-CM | POA: Diagnosis not present

## 2018-10-13 DIAGNOSIS — M5137 Other intervertebral disc degeneration, lumbosacral region: Secondary | ICD-10-CM | POA: Diagnosis not present

## 2018-10-28 DIAGNOSIS — M5137 Other intervertebral disc degeneration, lumbosacral region: Secondary | ICD-10-CM | POA: Diagnosis not present

## 2018-10-28 DIAGNOSIS — M9903 Segmental and somatic dysfunction of lumbar region: Secondary | ICD-10-CM | POA: Diagnosis not present

## 2018-10-30 DIAGNOSIS — H2512 Age-related nuclear cataract, left eye: Secondary | ICD-10-CM | POA: Diagnosis not present

## 2018-10-30 DIAGNOSIS — H25043 Posterior subcapsular polar age-related cataract, bilateral: Secondary | ICD-10-CM | POA: Diagnosis not present

## 2018-10-30 DIAGNOSIS — H2513 Age-related nuclear cataract, bilateral: Secondary | ICD-10-CM | POA: Diagnosis not present

## 2018-10-30 DIAGNOSIS — H18413 Arcus senilis, bilateral: Secondary | ICD-10-CM | POA: Diagnosis not present

## 2018-10-30 DIAGNOSIS — H25013 Cortical age-related cataract, bilateral: Secondary | ICD-10-CM | POA: Diagnosis not present

## 2018-11-03 DIAGNOSIS — F331 Major depressive disorder, recurrent, moderate: Secondary | ICD-10-CM | POA: Diagnosis not present

## 2018-11-03 DIAGNOSIS — R7301 Impaired fasting glucose: Secondary | ICD-10-CM | POA: Diagnosis not present

## 2018-11-03 DIAGNOSIS — E291 Testicular hypofunction: Secondary | ICD-10-CM | POA: Diagnosis not present

## 2018-11-03 DIAGNOSIS — E663 Overweight: Secondary | ICD-10-CM | POA: Diagnosis not present

## 2018-11-03 DIAGNOSIS — E782 Mixed hyperlipidemia: Secondary | ICD-10-CM | POA: Diagnosis not present

## 2018-11-03 DIAGNOSIS — I2510 Atherosclerotic heart disease of native coronary artery without angina pectoris: Secondary | ICD-10-CM | POA: Diagnosis not present

## 2018-11-03 DIAGNOSIS — Z0000 Encounter for general adult medical examination without abnormal findings: Secondary | ICD-10-CM | POA: Diagnosis not present

## 2018-11-03 DIAGNOSIS — N189 Chronic kidney disease, unspecified: Secondary | ICD-10-CM | POA: Diagnosis not present

## 2018-11-03 DIAGNOSIS — J45909 Unspecified asthma, uncomplicated: Secondary | ICD-10-CM | POA: Diagnosis not present

## 2018-12-15 DIAGNOSIS — H2513 Age-related nuclear cataract, bilateral: Secondary | ICD-10-CM | POA: Diagnosis not present

## 2018-12-15 DIAGNOSIS — H25013 Cortical age-related cataract, bilateral: Secondary | ICD-10-CM | POA: Diagnosis not present

## 2018-12-15 DIAGNOSIS — H18413 Arcus senilis, bilateral: Secondary | ICD-10-CM | POA: Diagnosis not present

## 2018-12-15 DIAGNOSIS — H25043 Posterior subcapsular polar age-related cataract, bilateral: Secondary | ICD-10-CM | POA: Diagnosis not present

## 2019-01-31 ENCOUNTER — Ambulatory Visit: Payer: Self-pay

## 2019-02-03 DIAGNOSIS — H2512 Age-related nuclear cataract, left eye: Secondary | ICD-10-CM | POA: Diagnosis not present

## 2019-02-03 DIAGNOSIS — H25812 Combined forms of age-related cataract, left eye: Secondary | ICD-10-CM | POA: Diagnosis not present

## 2019-02-05 ENCOUNTER — Ambulatory Visit: Payer: Self-pay

## 2019-02-11 ENCOUNTER — Ambulatory Visit: Payer: Self-pay

## 2019-05-06 DIAGNOSIS — J019 Acute sinusitis, unspecified: Secondary | ICD-10-CM | POA: Diagnosis not present

## 2019-05-06 DIAGNOSIS — N189 Chronic kidney disease, unspecified: Secondary | ICD-10-CM | POA: Diagnosis not present

## 2019-05-06 DIAGNOSIS — E291 Testicular hypofunction: Secondary | ICD-10-CM | POA: Diagnosis not present

## 2019-05-06 DIAGNOSIS — B353 Tinea pedis: Secondary | ICD-10-CM | POA: Diagnosis not present

## 2019-05-06 DIAGNOSIS — J45909 Unspecified asthma, uncomplicated: Secondary | ICD-10-CM | POA: Diagnosis not present

## 2019-05-06 DIAGNOSIS — F331 Major depressive disorder, recurrent, moderate: Secondary | ICD-10-CM | POA: Diagnosis not present

## 2019-05-06 DIAGNOSIS — E782 Mixed hyperlipidemia: Secondary | ICD-10-CM | POA: Diagnosis not present

## 2019-05-06 DIAGNOSIS — N1832 Chronic kidney disease, stage 3b: Secondary | ICD-10-CM | POA: Diagnosis not present

## 2019-05-06 DIAGNOSIS — I25110 Atherosclerotic heart disease of native coronary artery with unstable angina pectoris: Secondary | ICD-10-CM | POA: Diagnosis not present

## 2019-05-06 DIAGNOSIS — I2510 Atherosclerotic heart disease of native coronary artery without angina pectoris: Secondary | ICD-10-CM | POA: Diagnosis not present

## 2019-05-06 DIAGNOSIS — E669 Obesity, unspecified: Secondary | ICD-10-CM | POA: Diagnosis not present

## 2019-05-06 DIAGNOSIS — E663 Overweight: Secondary | ICD-10-CM | POA: Diagnosis not present

## 2019-05-12 DIAGNOSIS — F331 Major depressive disorder, recurrent, moderate: Secondary | ICD-10-CM | POA: Diagnosis not present

## 2019-05-12 DIAGNOSIS — J45909 Unspecified asthma, uncomplicated: Secondary | ICD-10-CM | POA: Diagnosis not present

## 2019-05-12 DIAGNOSIS — J019 Acute sinusitis, unspecified: Secondary | ICD-10-CM | POA: Diagnosis not present

## 2019-05-12 DIAGNOSIS — E782 Mixed hyperlipidemia: Secondary | ICD-10-CM | POA: Diagnosis not present

## 2019-05-12 DIAGNOSIS — N1831 Chronic kidney disease, stage 3a: Secondary | ICD-10-CM | POA: Diagnosis not present

## 2019-05-12 DIAGNOSIS — R7303 Prediabetes: Secondary | ICD-10-CM | POA: Diagnosis not present

## 2019-05-12 DIAGNOSIS — R7301 Impaired fasting glucose: Secondary | ICD-10-CM | POA: Diagnosis not present

## 2019-05-12 DIAGNOSIS — I2510 Atherosclerotic heart disease of native coronary artery without angina pectoris: Secondary | ICD-10-CM | POA: Diagnosis not present

## 2019-05-12 DIAGNOSIS — E663 Overweight: Secondary | ICD-10-CM | POA: Diagnosis not present

## 2019-05-12 DIAGNOSIS — Z136 Encounter for screening for cardiovascular disorders: Secondary | ICD-10-CM | POA: Diagnosis not present

## 2019-05-12 DIAGNOSIS — R61 Generalized hyperhidrosis: Secondary | ICD-10-CM | POA: Diagnosis not present

## 2019-05-12 DIAGNOSIS — E291 Testicular hypofunction: Secondary | ICD-10-CM | POA: Diagnosis not present

## 2019-05-18 ENCOUNTER — Other Ambulatory Visit: Payer: Self-pay | Admitting: Unknown Physician Specialty

## 2019-05-18 ENCOUNTER — Telehealth: Payer: Self-pay | Admitting: Unknown Physician Specialty

## 2019-05-18 DIAGNOSIS — U071 COVID-19: Secondary | ICD-10-CM

## 2019-05-18 MED ORDER — SODIUM CHLORIDE 0.9 % IV SOLN
Freq: Once | INTRAVENOUS | Status: AC
  Filled 2019-05-18: qty 700

## 2019-05-18 NOTE — Telephone Encounter (Signed)
  I connected by phone with Corey Woods on 05/18/2019 at 3:11 PM to discuss the potential use of an new treatment for mild to moderate COVID-19 viral infection in non-hospitalized patients.  This patient is a 74 y.o. male that meets the FDA criteria for Emergency Use Authorization of bamlanivimab/etesevimab or casirivimab/imdevimab.  Has a (+) direct SARS-CoV-2 viral test result  Has mild or moderate COVID-19   Is ? 72 years of age and weighs ? 40 kg  Is NOT hospitalized due to COVID-19  Is NOT requiring oxygen therapy or requiring an increase in baseline oxygen flow rate due to COVID-19  Is within 10 days of symptom onset  Has at least one of the high risk factor(s) for progression to severe COVID-19 and/or hospitalization as defined in EUA.  Specific high risk criteria : >/= 72 yo   I have spoken and communicated the following to the patient or parent/caregiver:  1. FDA has authorized the emergency use of bamlanivimab/etesevimab and casirivimab\imdevimab for the treatment of mild to moderate COVID-19 in adults and pediatric patients with positive results of direct SARS-CoV-2 viral testing who are 55 years of age and older weighing at least 40 kg, and who are at high risk for progressing to severe COVID-19 and/or hospitalization.  2. The significant known and potential risks and benefits of bamlanivimab/etesevimab and casirivimab\imdevimab, and the extent to which such potential risks and benefits are unknown.  3. Information on available alternative treatments and the risks and benefits of those alternatives, including clinical trials.  4. Patients treated with bamlanivimab/etesevimab and casirivimab\imdevimab should continue to self-isolate and use infection control measures (e.g., wear mask, isolate, social distance, avoid sharing personal items, clean and disinfect "high touch" surfaces, and frequent handwashing) according to CDC guidelines.   5. The patient or  parent/caregiver has the option to accept or refuse bamlanivimab/etesevimab or casirivimab\imdevimab .  After reviewing this information with the patient, The patient agreed to proceed with receiving the bamlanimivab infusion and will be provided a copy of the Fact sheet prior to receiving the infusion.[name] 05/18/2019 3:11 PM  Day 6 of illness

## 2019-05-19 ENCOUNTER — Other Ambulatory Visit: Payer: Self-pay

## 2019-05-19 ENCOUNTER — Ambulatory Visit (HOSPITAL_COMMUNITY)
Admission: RE | Admit: 2019-05-19 | Discharge: 2019-05-19 | Disposition: A | Discharge status: Home / Self Care | Payer: Medicare Other | Source: Ambulatory Visit | Attending: Pulmonary Disease | Admitting: Pulmonary Disease

## 2019-05-19 DIAGNOSIS — U071 COVID-19: Secondary | ICD-10-CM | POA: Diagnosis not present

## 2019-05-19 DIAGNOSIS — Z23 Encounter for immunization: Secondary | ICD-10-CM | POA: Diagnosis not present

## 2019-05-19 MED ORDER — METHYLPREDNISOLONE SODIUM SUCC 125 MG IJ SOLR: 125.0000 mg | Freq: Once | INTRAMUSCULAR | Status: DC | PRN

## 2019-05-19 MED ORDER — ALBUTEROL SULFATE HFA 108 (90 BASE) MCG/ACT IN AERS: 2.0000 | INHALATION_SPRAY | Freq: Once | RESPIRATORY_TRACT | Status: DC | PRN

## 2019-05-19 MED ORDER — FAMOTIDINE IN NACL 20-0.9 MG/50ML-% IV SOLN: 20.0000 mg | Freq: Once | INTRAVENOUS | Status: DC | PRN

## 2019-05-19 MED ORDER — SODIUM CHLORIDE 0.9 % IV SOLN: INTRAVENOUS | Status: DC | PRN

## 2019-05-19 MED ORDER — EPINEPHRINE 0.3 MG/0.3ML IJ SOAJ: 0.3000 mg | Freq: Once | INTRAMUSCULAR | Status: DC | PRN

## 2019-05-19 MED ORDER — DIPHENHYDRAMINE HCL 50 MG/ML IJ SOLN: 50.0000 mg | Freq: Once | INTRAMUSCULAR | Status: DC | PRN

## 2019-05-19 NOTE — Discharge Instructions (Signed)

## 2019-05-19 NOTE — Progress Notes (Signed)
  Diagnosis: COVID-19  Physician: Dr. Wright  Procedure: [facility] Med: bamlanivimab\etesevimab infusion - Provided patient with bamlanimivab\etesevimab fact sheet for patients, parents and caregivers prior to infusion.  Complications: No immediate complications noted.  Discharge: Discharged home   Chuong Casebeer S Whitnee Orzel 05/19/2019  

## 2019-08-25 DIAGNOSIS — M1711 Unilateral primary osteoarthritis, right knee: Secondary | ICD-10-CM | POA: Diagnosis not present

## 2019-08-25 DIAGNOSIS — M25562 Pain in left knee: Secondary | ICD-10-CM | POA: Diagnosis not present

## 2019-08-26 DIAGNOSIS — H524 Presbyopia: Secondary | ICD-10-CM | POA: Diagnosis not present

## 2019-08-26 DIAGNOSIS — H31011 Macula scars of posterior pole (postinflammatory) (post-traumatic), right eye: Secondary | ICD-10-CM | POA: Diagnosis not present

## 2019-08-26 DIAGNOSIS — H04123 Dry eye syndrome of bilateral lacrimal glands: Secondary | ICD-10-CM | POA: Diagnosis not present

## 2019-08-26 DIAGNOSIS — Z961 Presence of intraocular lens: Secondary | ICD-10-CM | POA: Diagnosis not present

## 2019-09-01 DIAGNOSIS — M1711 Unilateral primary osteoarthritis, right knee: Secondary | ICD-10-CM | POA: Diagnosis not present

## 2019-09-08 DIAGNOSIS — M1711 Unilateral primary osteoarthritis, right knee: Secondary | ICD-10-CM | POA: Diagnosis not present

## 2019-11-06 DIAGNOSIS — R350 Frequency of micturition: Secondary | ICD-10-CM | POA: Diagnosis not present

## 2019-11-06 DIAGNOSIS — N401 Enlarged prostate with lower urinary tract symptoms: Secondary | ICD-10-CM | POA: Diagnosis not present

## 2019-11-06 DIAGNOSIS — R948 Abnormal results of function studies of other organs and systems: Secondary | ICD-10-CM | POA: Diagnosis not present

## 2019-11-06 DIAGNOSIS — E291 Testicular hypofunction: Secondary | ICD-10-CM | POA: Diagnosis not present

## 2019-11-06 DIAGNOSIS — R3912 Poor urinary stream: Secondary | ICD-10-CM | POA: Diagnosis not present

## 2019-11-07 DIAGNOSIS — Z23 Encounter for immunization: Secondary | ICD-10-CM | POA: Diagnosis not present

## 2019-11-24 DIAGNOSIS — B353 Tinea pedis: Secondary | ICD-10-CM | POA: Diagnosis not present

## 2019-11-24 DIAGNOSIS — E669 Obesity, unspecified: Secondary | ICD-10-CM | POA: Diagnosis not present

## 2019-11-24 DIAGNOSIS — Z136 Encounter for screening for cardiovascular disorders: Secondary | ICD-10-CM | POA: Diagnosis not present

## 2019-11-24 DIAGNOSIS — Z6829 Body mass index (BMI) 29.0-29.9, adult: Secondary | ICD-10-CM | POA: Diagnosis not present

## 2019-11-24 DIAGNOSIS — R7303 Prediabetes: Secondary | ICD-10-CM | POA: Diagnosis not present

## 2019-11-24 DIAGNOSIS — F331 Major depressive disorder, recurrent, moderate: Secondary | ICD-10-CM | POA: Diagnosis not present

## 2019-11-24 DIAGNOSIS — J019 Acute sinusitis, unspecified: Secondary | ICD-10-CM | POA: Diagnosis not present

## 2019-11-24 DIAGNOSIS — N1831 Chronic kidney disease, stage 3a: Secondary | ICD-10-CM | POA: Diagnosis not present

## 2019-11-24 DIAGNOSIS — E291 Testicular hypofunction: Secondary | ICD-10-CM | POA: Diagnosis not present

## 2019-11-24 DIAGNOSIS — E782 Mixed hyperlipidemia: Secondary | ICD-10-CM | POA: Diagnosis not present

## 2019-11-24 DIAGNOSIS — E663 Overweight: Secondary | ICD-10-CM | POA: Diagnosis not present

## 2019-11-24 DIAGNOSIS — I25110 Atherosclerotic heart disease of native coronary artery with unstable angina pectoris: Secondary | ICD-10-CM | POA: Diagnosis not present

## 2019-12-01 DIAGNOSIS — E1122 Type 2 diabetes mellitus with diabetic chronic kidney disease: Secondary | ICD-10-CM | POA: Diagnosis not present

## 2019-12-01 DIAGNOSIS — J019 Acute sinusitis, unspecified: Secondary | ICD-10-CM | POA: Diagnosis not present

## 2019-12-01 DIAGNOSIS — Z6829 Body mass index (BMI) 29.0-29.9, adult: Secondary | ICD-10-CM | POA: Diagnosis not present

## 2019-12-01 DIAGNOSIS — F331 Major depressive disorder, recurrent, moderate: Secondary | ICD-10-CM | POA: Diagnosis not present

## 2019-12-01 DIAGNOSIS — I2510 Atherosclerotic heart disease of native coronary artery without angina pectoris: Secondary | ICD-10-CM | POA: Diagnosis not present

## 2019-12-01 DIAGNOSIS — Z0001 Encounter for general adult medical examination with abnormal findings: Secondary | ICD-10-CM | POA: Diagnosis not present

## 2019-12-01 DIAGNOSIS — R61 Generalized hyperhidrosis: Secondary | ICD-10-CM | POA: Diagnosis not present

## 2019-12-01 DIAGNOSIS — E663 Overweight: Secondary | ICD-10-CM | POA: Diagnosis not present

## 2019-12-01 DIAGNOSIS — E782 Mixed hyperlipidemia: Secondary | ICD-10-CM | POA: Diagnosis not present

## 2019-12-01 DIAGNOSIS — J45909 Unspecified asthma, uncomplicated: Secondary | ICD-10-CM | POA: Diagnosis not present

## 2019-12-01 DIAGNOSIS — N1831 Chronic kidney disease, stage 3a: Secondary | ICD-10-CM | POA: Diagnosis not present

## 2019-12-01 DIAGNOSIS — E291 Testicular hypofunction: Secondary | ICD-10-CM | POA: Diagnosis not present

## 2019-12-01 DIAGNOSIS — Z136 Encounter for screening for cardiovascular disorders: Secondary | ICD-10-CM | POA: Diagnosis not present

## 2019-12-07 ENCOUNTER — Other Ambulatory Visit (HOSPITAL_COMMUNITY): Payer: Self-pay | Admitting: Radiology

## 2019-12-07 DIAGNOSIS — R0600 Dyspnea, unspecified: Secondary | ICD-10-CM

## 2019-12-07 DIAGNOSIS — J45909 Unspecified asthma, uncomplicated: Secondary | ICD-10-CM

## 2019-12-15 DIAGNOSIS — Z6830 Body mass index (BMI) 30.0-30.9, adult: Secondary | ICD-10-CM | POA: Diagnosis not present

## 2019-12-15 DIAGNOSIS — E6609 Other obesity due to excess calories: Secondary | ICD-10-CM | POA: Diagnosis not present

## 2019-12-15 DIAGNOSIS — I10 Essential (primary) hypertension: Secondary | ICD-10-CM | POA: Diagnosis not present

## 2019-12-15 DIAGNOSIS — E1122 Type 2 diabetes mellitus with diabetic chronic kidney disease: Secondary | ICD-10-CM | POA: Diagnosis not present

## 2019-12-22 DIAGNOSIS — J069 Acute upper respiratory infection, unspecified: Secondary | ICD-10-CM | POA: Diagnosis not present

## 2020-01-22 ENCOUNTER — Other Ambulatory Visit (HOSPITAL_COMMUNITY)
Admission: RE | Admit: 2020-01-22 | Discharge: 2020-01-22 | Disposition: A | Discharge status: Home / Self Care | Payer: Medicare Other | Source: Ambulatory Visit | Attending: Internal Medicine | Admitting: Internal Medicine

## 2020-01-22 ENCOUNTER — Other Ambulatory Visit: Payer: Self-pay

## 2020-01-22 DIAGNOSIS — U071 COVID-19: Secondary | ICD-10-CM | POA: Diagnosis not present

## 2020-01-22 DIAGNOSIS — Z01812 Encounter for preprocedural laboratory examination: Secondary | ICD-10-CM | POA: Diagnosis not present

## 2020-01-22 LAB — SARS CORONAVIRUS 2 (TAT 6-24 HRS): SARS Coronavirus 2: POSITIVE — AB

## 2020-01-26 ENCOUNTER — Ambulatory Visit (HOSPITAL_COMMUNITY): Admission: RE | Admit: 2020-01-26 | Payer: Medicare Other | Source: Ambulatory Visit

## 2020-02-02 DIAGNOSIS — R053 Chronic cough: Secondary | ICD-10-CM | POA: Diagnosis not present

## 2020-02-02 DIAGNOSIS — J343 Hypertrophy of nasal turbinates: Secondary | ICD-10-CM | POA: Diagnosis not present

## 2020-02-02 DIAGNOSIS — R0982 Postnasal drip: Secondary | ICD-10-CM | POA: Diagnosis not present

## 2020-02-02 DIAGNOSIS — J310 Chronic rhinitis: Secondary | ICD-10-CM | POA: Diagnosis not present

## 2020-02-05 DIAGNOSIS — R948 Abnormal results of function studies of other organs and systems: Secondary | ICD-10-CM | POA: Diagnosis not present

## 2020-02-05 DIAGNOSIS — E291 Testicular hypofunction: Secondary | ICD-10-CM | POA: Diagnosis not present

## 2020-03-01 ENCOUNTER — Ambulatory Visit (HOSPITAL_COMMUNITY)
Admission: RE | Admit: 2020-03-01 | Discharge: 2020-03-01 | Disposition: A | Discharge status: Home / Self Care | Payer: Medicare Other | Source: Ambulatory Visit | Attending: Internal Medicine | Admitting: Internal Medicine

## 2020-03-01 ENCOUNTER — Other Ambulatory Visit: Payer: Self-pay

## 2020-03-01 DIAGNOSIS — R0600 Dyspnea, unspecified: Secondary | ICD-10-CM | POA: Diagnosis not present

## 2020-03-01 DIAGNOSIS — J45909 Unspecified asthma, uncomplicated: Secondary | ICD-10-CM | POA: Diagnosis not present

## 2020-03-01 LAB — PULMONARY FUNCTION TEST
DL/VA % pred: 108 %
DL/VA: 4.35 ml/min/mmHg/L
DLCO unc % pred: 99 %
DLCO unc: 25.89 ml/min/mmHg
FEF 25-75 Post: 3.25 L/sec
FEF 25-75 Pre: 3.42 L/sec
FEF2575-%Change-Post: -4 %
FEF2575-%Pred-Post: 134 %
FEF2575-%Pred-Pre: 141 %
FEV1-%Change-Post: -1 %
FEV1-%Pred-Post: 95 %
FEV1-%Pred-Pre: 96 %
FEV1-Post: 3.1 L
FEV1-Pre: 3.14 L
FEV1FVC-%Change-Post: 4 %
FEV1FVC-%Pred-Pre: 111 %
FEV6-%Change-Post: -4 %
FEV6-%Pred-Post: 86 %
FEV6-%Pred-Pre: 91 %
FEV6-Post: 3.63 L
FEV6-Pre: 3.8 L
FEV6FVC-%Change-Post: 0 %
FEV6FVC-%Pred-Post: 106 %
FEV6FVC-%Pred-Pre: 105 %
FVC-%Change-Post: -5 %
FVC-%Pred-Post: 81 %
FVC-%Pred-Pre: 86 %
FVC-Post: 3.63 L
FVC-Pre: 3.84 L
Post FEV1/FVC ratio: 85 %
Post FEV6/FVC ratio: 100 %
Pre FEV1/FVC ratio: 82 %
Pre FEV6/FVC Ratio: 100 %
RV % pred: 103 %
RV: 2.59 L
TLC % pred: 94 %
TLC: 6.74 L

## 2020-03-01 MED ORDER — ALBUTEROL SULFATE (2.5 MG/3ML) 0.083% IN NEBU
2.5000 mg | INHALATION_SOLUTION | Freq: Once | RESPIRATORY_TRACT | Status: AC
  Administered 2020-03-01: 2.5 mg via RESPIRATORY_TRACT

## 2020-04-25 DIAGNOSIS — B353 Tinea pedis: Secondary | ICD-10-CM | POA: Diagnosis not present

## 2020-04-25 DIAGNOSIS — R7303 Prediabetes: Secondary | ICD-10-CM | POA: Diagnosis not present

## 2020-04-25 DIAGNOSIS — Z6829 Body mass index (BMI) 29.0-29.9, adult: Secondary | ICD-10-CM | POA: Diagnosis not present

## 2020-04-25 DIAGNOSIS — E6609 Other obesity due to excess calories: Secondary | ICD-10-CM | POA: Diagnosis not present

## 2020-04-25 DIAGNOSIS — E669 Obesity, unspecified: Secondary | ICD-10-CM | POA: Diagnosis not present

## 2020-04-25 DIAGNOSIS — E782 Mixed hyperlipidemia: Secondary | ICD-10-CM | POA: Diagnosis not present

## 2020-04-25 DIAGNOSIS — N1831 Chronic kidney disease, stage 3a: Secondary | ICD-10-CM | POA: Diagnosis not present

## 2020-04-25 DIAGNOSIS — Z6830 Body mass index (BMI) 30.0-30.9, adult: Secondary | ICD-10-CM | POA: Diagnosis not present

## 2020-04-25 DIAGNOSIS — J019 Acute sinusitis, unspecified: Secondary | ICD-10-CM | POA: Diagnosis not present

## 2020-04-25 DIAGNOSIS — Z136 Encounter for screening for cardiovascular disorders: Secondary | ICD-10-CM | POA: Diagnosis not present

## 2020-04-25 DIAGNOSIS — E663 Overweight: Secondary | ICD-10-CM | POA: Diagnosis not present

## 2020-04-25 DIAGNOSIS — R0602 Shortness of breath: Secondary | ICD-10-CM | POA: Diagnosis not present

## 2020-04-29 DIAGNOSIS — E663 Overweight: Secondary | ICD-10-CM | POA: Diagnosis not present

## 2020-04-29 DIAGNOSIS — J45909 Unspecified asthma, uncomplicated: Secondary | ICD-10-CM | POA: Diagnosis not present

## 2020-04-29 DIAGNOSIS — I10 Essential (primary) hypertension: Secondary | ICD-10-CM | POA: Diagnosis not present

## 2020-04-29 DIAGNOSIS — E1122 Type 2 diabetes mellitus with diabetic chronic kidney disease: Secondary | ICD-10-CM | POA: Diagnosis not present

## 2020-04-29 DIAGNOSIS — F331 Major depressive disorder, recurrent, moderate: Secondary | ICD-10-CM | POA: Diagnosis not present

## 2020-04-29 DIAGNOSIS — I2510 Atherosclerotic heart disease of native coronary artery without angina pectoris: Secondary | ICD-10-CM | POA: Diagnosis not present

## 2020-04-29 DIAGNOSIS — Z136 Encounter for screening for cardiovascular disorders: Secondary | ICD-10-CM | POA: Diagnosis not present

## 2020-04-29 DIAGNOSIS — E291 Testicular hypofunction: Secondary | ICD-10-CM | POA: Diagnosis not present

## 2020-04-29 DIAGNOSIS — Z6829 Body mass index (BMI) 29.0-29.9, adult: Secondary | ICD-10-CM | POA: Diagnosis not present

## 2020-04-29 DIAGNOSIS — E782 Mixed hyperlipidemia: Secondary | ICD-10-CM | POA: Diagnosis not present

## 2020-04-29 DIAGNOSIS — N1831 Chronic kidney disease, stage 3a: Secondary | ICD-10-CM | POA: Diagnosis not present

## 2020-04-29 DIAGNOSIS — R61 Generalized hyperhidrosis: Secondary | ICD-10-CM | POA: Diagnosis not present

## 2020-05-05 DIAGNOSIS — N401 Enlarged prostate with lower urinary tract symptoms: Secondary | ICD-10-CM | POA: Diagnosis not present

## 2020-05-05 DIAGNOSIS — R948 Abnormal results of function studies of other organs and systems: Secondary | ICD-10-CM | POA: Diagnosis not present

## 2020-05-05 DIAGNOSIS — R3912 Poor urinary stream: Secondary | ICD-10-CM | POA: Diagnosis not present

## 2020-05-05 DIAGNOSIS — N5201 Erectile dysfunction due to arterial insufficiency: Secondary | ICD-10-CM | POA: Diagnosis not present

## 2020-05-05 DIAGNOSIS — E291 Testicular hypofunction: Secondary | ICD-10-CM | POA: Diagnosis not present

## 2020-05-31 DIAGNOSIS — Z23 Encounter for immunization: Secondary | ICD-10-CM | POA: Diagnosis not present

## 2020-06-07 DIAGNOSIS — M1711 Unilateral primary osteoarthritis, right knee: Secondary | ICD-10-CM | POA: Diagnosis not present

## 2020-06-15 ENCOUNTER — Ambulatory Visit: Payer: Medicare Other | Admitting: Orthopaedic Surgery

## 2020-06-21 DIAGNOSIS — M1711 Unilateral primary osteoarthritis, right knee: Secondary | ICD-10-CM | POA: Diagnosis not present

## 2020-06-28 DIAGNOSIS — M1711 Unilateral primary osteoarthritis, right knee: Secondary | ICD-10-CM | POA: Diagnosis not present

## 2020-07-21 DIAGNOSIS — E291 Testicular hypofunction: Secondary | ICD-10-CM | POA: Insufficient documentation

## 2020-07-22 DIAGNOSIS — E291 Testicular hypofunction: Secondary | ICD-10-CM | POA: Diagnosis not present

## 2020-07-22 DIAGNOSIS — R7303 Prediabetes: Secondary | ICD-10-CM | POA: Diagnosis not present

## 2020-07-22 DIAGNOSIS — R972 Elevated prostate specific antigen [PSA]: Secondary | ICD-10-CM | POA: Diagnosis not present

## 2020-07-22 DIAGNOSIS — E782 Mixed hyperlipidemia: Secondary | ICD-10-CM | POA: Diagnosis not present

## 2020-08-10 DIAGNOSIS — N529 Male erectile dysfunction, unspecified: Secondary | ICD-10-CM | POA: Insufficient documentation

## 2020-08-10 DIAGNOSIS — F331 Major depressive disorder, recurrent, moderate: Secondary | ICD-10-CM | POA: Diagnosis not present

## 2020-08-10 DIAGNOSIS — I2510 Atherosclerotic heart disease of native coronary artery without angina pectoris: Secondary | ICD-10-CM | POA: Diagnosis not present

## 2020-08-10 DIAGNOSIS — E291 Testicular hypofunction: Secondary | ICD-10-CM | POA: Diagnosis not present

## 2020-08-10 DIAGNOSIS — J4520 Mild intermittent asthma, uncomplicated: Secondary | ICD-10-CM | POA: Insufficient documentation

## 2020-08-10 DIAGNOSIS — E1122 Type 2 diabetes mellitus with diabetic chronic kidney disease: Secondary | ICD-10-CM | POA: Diagnosis not present

## 2020-08-10 DIAGNOSIS — G4733 Obstructive sleep apnea (adult) (pediatric): Secondary | ICD-10-CM | POA: Insufficient documentation

## 2020-08-10 DIAGNOSIS — I10 Essential (primary) hypertension: Secondary | ICD-10-CM | POA: Diagnosis not present

## 2020-08-10 DIAGNOSIS — F5221 Male erectile disorder: Secondary | ICD-10-CM | POA: Diagnosis not present

## 2020-08-10 DIAGNOSIS — D509 Iron deficiency anemia, unspecified: Secondary | ICD-10-CM | POA: Insufficient documentation

## 2020-08-10 DIAGNOSIS — E669 Obesity, unspecified: Secondary | ICD-10-CM | POA: Insufficient documentation

## 2020-08-10 DIAGNOSIS — N1831 Chronic kidney disease, stage 3a: Secondary | ICD-10-CM | POA: Diagnosis not present

## 2020-08-10 DIAGNOSIS — E782 Mixed hyperlipidemia: Secondary | ICD-10-CM | POA: Diagnosis not present

## 2020-08-10 DIAGNOSIS — E663 Overweight: Secondary | ICD-10-CM | POA: Diagnosis not present

## 2020-09-01 DIAGNOSIS — H31011 Macula scars of posterior pole (postinflammatory) (post-traumatic), right eye: Secondary | ICD-10-CM | POA: Diagnosis not present

## 2020-09-01 DIAGNOSIS — Z961 Presence of intraocular lens: Secondary | ICD-10-CM | POA: Diagnosis not present

## 2020-09-01 DIAGNOSIS — H524 Presbyopia: Secondary | ICD-10-CM | POA: Diagnosis not present

## 2020-09-01 DIAGNOSIS — H26493 Other secondary cataract, bilateral: Secondary | ICD-10-CM | POA: Diagnosis not present

## 2020-09-24 DIAGNOSIS — Z23 Encounter for immunization: Secondary | ICD-10-CM | POA: Diagnosis not present

## 2020-10-18 DIAGNOSIS — M9905 Segmental and somatic dysfunction of pelvic region: Secondary | ICD-10-CM | POA: Diagnosis not present

## 2020-10-18 DIAGNOSIS — M5137 Other intervertebral disc degeneration, lumbosacral region: Secondary | ICD-10-CM | POA: Diagnosis not present

## 2020-10-18 DIAGNOSIS — M5136 Other intervertebral disc degeneration, lumbar region: Secondary | ICD-10-CM | POA: Diagnosis not present

## 2020-10-18 DIAGNOSIS — M9903 Segmental and somatic dysfunction of lumbar region: Secondary | ICD-10-CM | POA: Diagnosis not present

## 2020-10-19 DIAGNOSIS — M5137 Other intervertebral disc degeneration, lumbosacral region: Secondary | ICD-10-CM | POA: Diagnosis not present

## 2020-10-19 DIAGNOSIS — M5136 Other intervertebral disc degeneration, lumbar region: Secondary | ICD-10-CM | POA: Diagnosis not present

## 2020-10-19 DIAGNOSIS — M9903 Segmental and somatic dysfunction of lumbar region: Secondary | ICD-10-CM | POA: Diagnosis not present

## 2020-10-19 DIAGNOSIS — M9905 Segmental and somatic dysfunction of pelvic region: Secondary | ICD-10-CM | POA: Diagnosis not present

## 2020-10-20 DIAGNOSIS — M5137 Other intervertebral disc degeneration, lumbosacral region: Secondary | ICD-10-CM | POA: Diagnosis not present

## 2020-10-20 DIAGNOSIS — M9903 Segmental and somatic dysfunction of lumbar region: Secondary | ICD-10-CM | POA: Diagnosis not present

## 2020-10-20 DIAGNOSIS — M5136 Other intervertebral disc degeneration, lumbar region: Secondary | ICD-10-CM | POA: Diagnosis not present

## 2020-10-20 DIAGNOSIS — M9905 Segmental and somatic dysfunction of pelvic region: Secondary | ICD-10-CM | POA: Diagnosis not present

## 2020-10-24 DIAGNOSIS — M9905 Segmental and somatic dysfunction of pelvic region: Secondary | ICD-10-CM | POA: Diagnosis not present

## 2020-10-24 DIAGNOSIS — M9903 Segmental and somatic dysfunction of lumbar region: Secondary | ICD-10-CM | POA: Diagnosis not present

## 2020-10-24 DIAGNOSIS — M5137 Other intervertebral disc degeneration, lumbosacral region: Secondary | ICD-10-CM | POA: Diagnosis not present

## 2020-10-24 DIAGNOSIS — M5136 Other intervertebral disc degeneration, lumbar region: Secondary | ICD-10-CM | POA: Diagnosis not present

## 2020-10-26 DIAGNOSIS — M5136 Other intervertebral disc degeneration, lumbar region: Secondary | ICD-10-CM | POA: Diagnosis not present

## 2020-10-26 DIAGNOSIS — M5137 Other intervertebral disc degeneration, lumbosacral region: Secondary | ICD-10-CM | POA: Diagnosis not present

## 2020-10-26 DIAGNOSIS — M9905 Segmental and somatic dysfunction of pelvic region: Secondary | ICD-10-CM | POA: Diagnosis not present

## 2020-10-26 DIAGNOSIS — E291 Testicular hypofunction: Secondary | ICD-10-CM | POA: Diagnosis not present

## 2020-10-26 DIAGNOSIS — M9903 Segmental and somatic dysfunction of lumbar region: Secondary | ICD-10-CM | POA: Diagnosis not present

## 2020-10-27 DIAGNOSIS — M9905 Segmental and somatic dysfunction of pelvic region: Secondary | ICD-10-CM | POA: Diagnosis not present

## 2020-10-27 DIAGNOSIS — M5137 Other intervertebral disc degeneration, lumbosacral region: Secondary | ICD-10-CM | POA: Diagnosis not present

## 2020-10-27 DIAGNOSIS — M9903 Segmental and somatic dysfunction of lumbar region: Secondary | ICD-10-CM | POA: Diagnosis not present

## 2020-10-27 DIAGNOSIS — M5136 Other intervertebral disc degeneration, lumbar region: Secondary | ICD-10-CM | POA: Diagnosis not present

## 2020-10-31 DIAGNOSIS — M9903 Segmental and somatic dysfunction of lumbar region: Secondary | ICD-10-CM | POA: Diagnosis not present

## 2020-10-31 DIAGNOSIS — M5136 Other intervertebral disc degeneration, lumbar region: Secondary | ICD-10-CM | POA: Diagnosis not present

## 2020-10-31 DIAGNOSIS — M9905 Segmental and somatic dysfunction of pelvic region: Secondary | ICD-10-CM | POA: Diagnosis not present

## 2020-10-31 DIAGNOSIS — M5137 Other intervertebral disc degeneration, lumbosacral region: Secondary | ICD-10-CM | POA: Diagnosis not present

## 2020-11-02 DIAGNOSIS — M5137 Other intervertebral disc degeneration, lumbosacral region: Secondary | ICD-10-CM | POA: Diagnosis not present

## 2020-11-02 DIAGNOSIS — M9905 Segmental and somatic dysfunction of pelvic region: Secondary | ICD-10-CM | POA: Diagnosis not present

## 2020-11-02 DIAGNOSIS — M9903 Segmental and somatic dysfunction of lumbar region: Secondary | ICD-10-CM | POA: Diagnosis not present

## 2020-11-02 DIAGNOSIS — M5136 Other intervertebral disc degeneration, lumbar region: Secondary | ICD-10-CM | POA: Diagnosis not present

## 2020-11-03 DIAGNOSIS — M5137 Other intervertebral disc degeneration, lumbosacral region: Secondary | ICD-10-CM | POA: Diagnosis not present

## 2020-11-03 DIAGNOSIS — M5136 Other intervertebral disc degeneration, lumbar region: Secondary | ICD-10-CM | POA: Diagnosis not present

## 2020-11-03 DIAGNOSIS — M9903 Segmental and somatic dysfunction of lumbar region: Secondary | ICD-10-CM | POA: Diagnosis not present

## 2020-11-03 DIAGNOSIS — N5201 Erectile dysfunction due to arterial insufficiency: Secondary | ICD-10-CM | POA: Diagnosis not present

## 2020-11-03 DIAGNOSIS — M9905 Segmental and somatic dysfunction of pelvic region: Secondary | ICD-10-CM | POA: Diagnosis not present

## 2020-11-03 DIAGNOSIS — N401 Enlarged prostate with lower urinary tract symptoms: Secondary | ICD-10-CM | POA: Diagnosis not present

## 2020-11-03 DIAGNOSIS — R3912 Poor urinary stream: Secondary | ICD-10-CM | POA: Diagnosis not present

## 2020-11-03 DIAGNOSIS — E291 Testicular hypofunction: Secondary | ICD-10-CM | POA: Diagnosis not present

## 2020-11-07 DIAGNOSIS — M5137 Other intervertebral disc degeneration, lumbosacral region: Secondary | ICD-10-CM | POA: Diagnosis not present

## 2020-11-07 DIAGNOSIS — M9905 Segmental and somatic dysfunction of pelvic region: Secondary | ICD-10-CM | POA: Diagnosis not present

## 2020-11-07 DIAGNOSIS — M9903 Segmental and somatic dysfunction of lumbar region: Secondary | ICD-10-CM | POA: Diagnosis not present

## 2020-11-07 DIAGNOSIS — M5136 Other intervertebral disc degeneration, lumbar region: Secondary | ICD-10-CM | POA: Diagnosis not present

## 2020-11-08 DIAGNOSIS — R0600 Dyspnea, unspecified: Secondary | ICD-10-CM | POA: Insufficient documentation

## 2020-11-09 ENCOUNTER — Other Ambulatory Visit (HOSPITAL_COMMUNITY): Payer: Self-pay | Admitting: Nurse Practitioner

## 2020-11-09 DIAGNOSIS — R0600 Dyspnea, unspecified: Secondary | ICD-10-CM

## 2020-11-09 DIAGNOSIS — F319 Bipolar disorder, unspecified: Secondary | ICD-10-CM | POA: Insufficient documentation

## 2020-11-09 DIAGNOSIS — F331 Major depressive disorder, recurrent, moderate: Secondary | ICD-10-CM | POA: Diagnosis not present

## 2020-11-09 DIAGNOSIS — L989 Disorder of the skin and subcutaneous tissue, unspecified: Secondary | ICD-10-CM | POA: Insufficient documentation

## 2020-11-15 ENCOUNTER — Other Ambulatory Visit: Payer: Self-pay

## 2020-11-15 ENCOUNTER — Ambulatory Visit (HOSPITAL_COMMUNITY)
Admission: RE | Admit: 2020-11-15 | Discharge: 2020-11-15 | Disposition: A | Discharge status: Home / Self Care | Payer: Medicare Other | Source: Ambulatory Visit | Attending: Nurse Practitioner | Admitting: Nurse Practitioner

## 2020-11-15 DIAGNOSIS — Z20822 Contact with and (suspected) exposure to covid-19: Secondary | ICD-10-CM | POA: Diagnosis not present

## 2020-11-15 DIAGNOSIS — N183 Chronic kidney disease, stage 3 unspecified: Secondary | ICD-10-CM | POA: Diagnosis not present

## 2020-11-15 DIAGNOSIS — I340 Nonrheumatic mitral (valve) insufficiency: Secondary | ICD-10-CM | POA: Diagnosis not present

## 2020-11-15 DIAGNOSIS — I493 Ventricular premature depolarization: Secondary | ICD-10-CM | POA: Diagnosis not present

## 2020-11-15 DIAGNOSIS — I25110 Atherosclerotic heart disease of native coronary artery with unstable angina pectoris: Secondary | ICD-10-CM | POA: Diagnosis not present

## 2020-11-15 DIAGNOSIS — N1831 Chronic kidney disease, stage 3a: Secondary | ICD-10-CM | POA: Diagnosis not present

## 2020-11-15 DIAGNOSIS — I509 Heart failure, unspecified: Secondary | ICD-10-CM | POA: Diagnosis not present

## 2020-11-15 DIAGNOSIS — Z87891 Personal history of nicotine dependence: Secondary | ICD-10-CM | POA: Diagnosis not present

## 2020-11-15 DIAGNOSIS — Z9911 Dependence on respirator [ventilator] status: Secondary | ICD-10-CM | POA: Diagnosis not present

## 2020-11-15 DIAGNOSIS — R0602 Shortness of breath: Secondary | ICD-10-CM | POA: Diagnosis not present

## 2020-11-15 DIAGNOSIS — E785 Hyperlipidemia, unspecified: Secondary | ICD-10-CM | POA: Diagnosis present

## 2020-11-15 DIAGNOSIS — I471 Supraventricular tachycardia: Secondary | ICD-10-CM | POA: Diagnosis not present

## 2020-11-15 DIAGNOSIS — J95821 Acute postprocedural respiratory failure: Secondary | ICD-10-CM | POA: Diagnosis not present

## 2020-11-15 DIAGNOSIS — I252 Old myocardial infarction: Secondary | ICD-10-CM | POA: Diagnosis not present

## 2020-11-15 DIAGNOSIS — Z01818 Encounter for other preprocedural examination: Secondary | ICD-10-CM | POA: Diagnosis not present

## 2020-11-15 DIAGNOSIS — I152 Hypertension secondary to endocrine disorders: Secondary | ICD-10-CM | POA: Diagnosis not present

## 2020-11-15 DIAGNOSIS — D62 Acute posthemorrhagic anemia: Secondary | ICD-10-CM | POA: Diagnosis not present

## 2020-11-15 DIAGNOSIS — I214 Non-ST elevation (NSTEMI) myocardial infarction: Secondary | ICD-10-CM | POA: Diagnosis not present

## 2020-11-15 DIAGNOSIS — I5023 Acute on chronic systolic (congestive) heart failure: Secondary | ICD-10-CM | POA: Diagnosis not present

## 2020-11-15 DIAGNOSIS — I5021 Acute systolic (congestive) heart failure: Secondary | ICD-10-CM | POA: Diagnosis not present

## 2020-11-15 DIAGNOSIS — Z6836 Body mass index (BMI) 36.0-36.9, adult: Secondary | ICD-10-CM | POA: Diagnosis not present

## 2020-11-15 DIAGNOSIS — E669 Obesity, unspecified: Secondary | ICD-10-CM | POA: Diagnosis not present

## 2020-11-15 DIAGNOSIS — J9811 Atelectasis: Secondary | ICD-10-CM | POA: Diagnosis not present

## 2020-11-15 DIAGNOSIS — J90 Pleural effusion, not elsewhere classified: Secondary | ICD-10-CM | POA: Diagnosis not present

## 2020-11-15 DIAGNOSIS — R7303 Prediabetes: Secondary | ICD-10-CM | POA: Diagnosis not present

## 2020-11-15 DIAGNOSIS — R0600 Dyspnea, unspecified: Secondary | ICD-10-CM | POA: Diagnosis not present

## 2020-11-15 DIAGNOSIS — E1122 Type 2 diabetes mellitus with diabetic chronic kidney disease: Secondary | ICD-10-CM | POA: Diagnosis not present

## 2020-11-15 DIAGNOSIS — Z8249 Family history of ischemic heart disease and other diseases of the circulatory system: Secondary | ICD-10-CM | POA: Diagnosis not present

## 2020-11-15 DIAGNOSIS — F32A Depression, unspecified: Secondary | ICD-10-CM | POA: Diagnosis not present

## 2020-11-15 DIAGNOSIS — I2510 Atherosclerotic heart disease of native coronary artery without angina pectoris: Secondary | ICD-10-CM | POA: Diagnosis not present

## 2020-11-15 DIAGNOSIS — E1159 Type 2 diabetes mellitus with other circulatory complications: Secondary | ICD-10-CM | POA: Diagnosis not present

## 2020-11-15 DIAGNOSIS — I4891 Unspecified atrial fibrillation: Secondary | ICD-10-CM | POA: Diagnosis not present

## 2020-11-15 DIAGNOSIS — Z01810 Encounter for preprocedural cardiovascular examination: Secondary | ICD-10-CM | POA: Diagnosis not present

## 2020-11-15 DIAGNOSIS — I270 Primary pulmonary hypertension: Secondary | ICD-10-CM | POA: Diagnosis not present

## 2020-11-15 DIAGNOSIS — N400 Enlarged prostate without lower urinary tract symptoms: Secondary | ICD-10-CM | POA: Diagnosis present

## 2020-11-15 DIAGNOSIS — R079 Chest pain, unspecified: Secondary | ICD-10-CM | POA: Diagnosis not present

## 2020-11-15 DIAGNOSIS — J45909 Unspecified asthma, uncomplicated: Secondary | ICD-10-CM | POA: Diagnosis not present

## 2020-11-15 DIAGNOSIS — I517 Cardiomegaly: Secondary | ICD-10-CM | POA: Diagnosis not present

## 2020-11-15 DIAGNOSIS — R030 Elevated blood-pressure reading, without diagnosis of hypertension: Secondary | ICD-10-CM | POA: Diagnosis not present

## 2020-11-15 DIAGNOSIS — I130 Hypertensive heart and chronic kidney disease with heart failure and stage 1 through stage 4 chronic kidney disease, or unspecified chronic kidney disease: Secondary | ICD-10-CM | POA: Diagnosis not present

## 2020-11-15 DIAGNOSIS — Z881 Allergy status to other antibiotic agents status: Secondary | ICD-10-CM | POA: Diagnosis not present

## 2020-11-18 ENCOUNTER — Other Ambulatory Visit: Payer: Self-pay

## 2020-11-18 ENCOUNTER — Encounter (HOSPITAL_COMMUNITY): Payer: Self-pay | Admitting: *Deleted

## 2020-11-18 ENCOUNTER — Emergency Department (HOSPITAL_COMMUNITY): Payer: Medicare Other

## 2020-11-18 ENCOUNTER — Inpatient Hospital Stay (HOSPITAL_COMMUNITY)
Admission: EM | Admit: 2020-11-18 | Discharge: 2020-11-30 | DRG: 233 | Disposition: A | Discharge status: Home / Self Care | Payer: Medicare Other | Attending: Thoracic Surgery (Cardiothoracic Vascular Surgery) | Admitting: Thoracic Surgery (Cardiothoracic Vascular Surgery)

## 2020-11-18 DIAGNOSIS — J9811 Atelectasis: Secondary | ICD-10-CM | POA: Diagnosis not present

## 2020-11-18 DIAGNOSIS — Z8249 Family history of ischemic heart disease and other diseases of the circulatory system: Secondary | ICD-10-CM | POA: Diagnosis not present

## 2020-11-18 DIAGNOSIS — I270 Primary pulmonary hypertension: Secondary | ICD-10-CM | POA: Diagnosis present

## 2020-11-18 DIAGNOSIS — I5023 Acute on chronic systolic (congestive) heart failure: Secondary | ICD-10-CM | POA: Diagnosis present

## 2020-11-18 DIAGNOSIS — E785 Hyperlipidemia, unspecified: Secondary | ICD-10-CM | POA: Diagnosis present

## 2020-11-18 DIAGNOSIS — D62 Acute posthemorrhagic anemia: Secondary | ICD-10-CM | POA: Diagnosis not present

## 2020-11-18 DIAGNOSIS — Z9911 Dependence on respirator [ventilator] status: Secondary | ICD-10-CM | POA: Diagnosis not present

## 2020-11-18 DIAGNOSIS — I4891 Unspecified atrial fibrillation: Secondary | ICD-10-CM | POA: Diagnosis not present

## 2020-11-18 DIAGNOSIS — Z87891 Personal history of nicotine dependence: Secondary | ICD-10-CM

## 2020-11-18 DIAGNOSIS — I214 Non-ST elevation (NSTEMI) myocardial infarction: Principal | ICD-10-CM | POA: Diagnosis present

## 2020-11-18 DIAGNOSIS — I130 Hypertensive heart and chronic kidney disease with heart failure and stage 1 through stage 4 chronic kidney disease, or unspecified chronic kidney disease: Secondary | ICD-10-CM | POA: Diagnosis present

## 2020-11-18 DIAGNOSIS — J45909 Unspecified asthma, uncomplicated: Secondary | ICD-10-CM | POA: Diagnosis present

## 2020-11-18 DIAGNOSIS — E1122 Type 2 diabetes mellitus with diabetic chronic kidney disease: Secondary | ICD-10-CM | POA: Diagnosis present

## 2020-11-18 DIAGNOSIS — R030 Elevated blood-pressure reading, without diagnosis of hypertension: Secondary | ICD-10-CM | POA: Diagnosis not present

## 2020-11-18 DIAGNOSIS — Z09 Encounter for follow-up examination after completed treatment for conditions other than malignant neoplasm: Secondary | ICD-10-CM

## 2020-11-18 DIAGNOSIS — Z881 Allergy status to other antibiotic agents status: Secondary | ICD-10-CM

## 2020-11-18 DIAGNOSIS — I493 Ventricular premature depolarization: Secondary | ICD-10-CM | POA: Diagnosis not present

## 2020-11-18 DIAGNOSIS — I252 Old myocardial infarction: Secondary | ICD-10-CM

## 2020-11-18 DIAGNOSIS — Z79899 Other long term (current) drug therapy: Secondary | ICD-10-CM

## 2020-11-18 DIAGNOSIS — N400 Enlarged prostate without lower urinary tract symptoms: Secondary | ICD-10-CM | POA: Diagnosis present

## 2020-11-18 DIAGNOSIS — Z01818 Encounter for other preprocedural examination: Secondary | ICD-10-CM | POA: Diagnosis not present

## 2020-11-18 DIAGNOSIS — E1159 Type 2 diabetes mellitus with other circulatory complications: Secondary | ICD-10-CM | POA: Diagnosis not present

## 2020-11-18 DIAGNOSIS — R7303 Prediabetes: Secondary | ICD-10-CM

## 2020-11-18 DIAGNOSIS — I25110 Atherosclerotic heart disease of native coronary artery with unstable angina pectoris: Secondary | ICD-10-CM | POA: Diagnosis present

## 2020-11-18 DIAGNOSIS — Z951 Presence of aortocoronary bypass graft: Secondary | ICD-10-CM

## 2020-11-18 DIAGNOSIS — Z01810 Encounter for preprocedural cardiovascular examination: Secondary | ICD-10-CM | POA: Diagnosis not present

## 2020-11-18 DIAGNOSIS — I471 Supraventricular tachycardia: Secondary | ICD-10-CM | POA: Diagnosis not present

## 2020-11-18 DIAGNOSIS — I5021 Acute systolic (congestive) heart failure: Secondary | ICD-10-CM | POA: Diagnosis not present

## 2020-11-18 DIAGNOSIS — F32A Depression, unspecified: Secondary | ICD-10-CM | POA: Diagnosis present

## 2020-11-18 DIAGNOSIS — E669 Obesity, unspecified: Secondary | ICD-10-CM | POA: Diagnosis present

## 2020-11-18 DIAGNOSIS — I2510 Atherosclerotic heart disease of native coronary artery without angina pectoris: Secondary | ICD-10-CM | POA: Diagnosis not present

## 2020-11-18 DIAGNOSIS — I255 Ischemic cardiomyopathy: Secondary | ICD-10-CM | POA: Diagnosis not present

## 2020-11-18 DIAGNOSIS — J95821 Acute postprocedural respiratory failure: Secondary | ICD-10-CM | POA: Diagnosis not present

## 2020-11-18 DIAGNOSIS — Z20822 Contact with and (suspected) exposure to covid-19: Secondary | ICD-10-CM | POA: Diagnosis present

## 2020-11-18 DIAGNOSIS — R0602 Shortness of breath: Secondary | ICD-10-CM | POA: Diagnosis not present

## 2020-11-18 DIAGNOSIS — J90 Pleural effusion, not elsewhere classified: Secondary | ICD-10-CM

## 2020-11-18 DIAGNOSIS — R7401 Elevation of levels of liver transaminase levels: Secondary | ICD-10-CM | POA: Diagnosis not present

## 2020-11-18 DIAGNOSIS — I509 Heart failure, unspecified: Secondary | ICD-10-CM | POA: Diagnosis not present

## 2020-11-18 DIAGNOSIS — I340 Nonrheumatic mitral (valve) insufficiency: Secondary | ICD-10-CM | POA: Diagnosis not present

## 2020-11-18 DIAGNOSIS — N1831 Chronic kidney disease, stage 3a: Secondary | ICD-10-CM | POA: Diagnosis present

## 2020-11-18 DIAGNOSIS — Z6836 Body mass index (BMI) 36.0-36.9, adult: Secondary | ICD-10-CM | POA: Diagnosis not present

## 2020-11-18 DIAGNOSIS — D303 Benign neoplasm of bladder: Secondary | ICD-10-CM | POA: Diagnosis present

## 2020-11-18 DIAGNOSIS — R079 Chest pain, unspecified: Secondary | ICD-10-CM | POA: Diagnosis not present

## 2020-11-18 DIAGNOSIS — N183 Chronic kidney disease, stage 3 unspecified: Secondary | ICD-10-CM | POA: Diagnosis not present

## 2020-11-18 DIAGNOSIS — I152 Hypertension secondary to endocrine disorders: Secondary | ICD-10-CM | POA: Diagnosis not present

## 2020-11-18 DIAGNOSIS — I517 Cardiomegaly: Secondary | ICD-10-CM | POA: Diagnosis not present

## 2020-11-18 HISTORY — DX: Chronic kidney disease, stage 3 unspecified: N18.30

## 2020-11-18 HISTORY — DX: Prediabetes: R73.03

## 2020-11-18 HISTORY — DX: Other specified health status: Z78.9

## 2020-11-18 HISTORY — DX: Benign neoplasm of bladder: D30.3

## 2020-11-18 HISTORY — DX: Unspecified asthma, uncomplicated: J45.909

## 2020-11-18 HISTORY — DX: Depression, unspecified: F32.A

## 2020-11-18 LAB — BASIC METABOLIC PANEL
Anion gap: 9 (ref 5–15)
BUN: 27 mg/dL — ABNORMAL HIGH (ref 8–23)
CO2: 25 mmol/L (ref 22–32)
Calcium: 9 mg/dL (ref 8.9–10.3)
Chloride: 100 mmol/L (ref 98–111)
Creatinine, Ser: 1.37 mg/dL — ABNORMAL HIGH (ref 0.61–1.24)
GFR, Estimated: 55 mL/min — ABNORMAL LOW (ref >60)
Glucose, Bld: 275 mg/dL — ABNORMAL HIGH (ref 70–99)
Potassium: 4.1 mmol/L (ref 3.5–5.1)
Sodium: 134 mmol/L — ABNORMAL LOW (ref 135–145)

## 2020-11-18 LAB — HEPATIC FUNCTION PANEL
ALT: 31 U/L (ref 0–44)
AST: 24 U/L (ref 15–41)
Albumin: 3.6 g/dL (ref 3.5–5.0)
Alkaline Phosphatase: 59 U/L (ref 38–126)
Bilirubin, Direct: 0.2 mg/dL (ref 0.0–0.2)
Indirect Bilirubin: 0.9 mg/dL (ref 0.3–0.9)
Total Bilirubin: 1.1 mg/dL (ref 0.3–1.2)
Total Protein: 6.3 g/dL — ABNORMAL LOW (ref 6.5–8.1)

## 2020-11-18 LAB — CBC
HCT: 43 % (ref 39.0–52.0)
Hemoglobin: 14.8 g/dL (ref 13.0–17.0)
MCH: 30.3 pg (ref 26.0–34.0)
MCHC: 34.4 g/dL (ref 30.0–36.0)
MCV: 87.9 fL (ref 80.0–100.0)
Platelets: 168 10*3/uL (ref 150–400)
RBC: 4.89 MIL/uL (ref 4.22–5.81)
RDW: 12.3 % (ref 11.5–15.5)
WBC: 6.3 10*3/uL (ref 4.0–10.5)
nRBC: 0 % (ref 0.0–0.2)

## 2020-11-18 LAB — RAPID URINE DRUG SCREEN, HOSP PERFORMED
Amphetamines: NOT DETECTED
Barbiturates: NOT DETECTED
Benzodiazepines: NOT DETECTED
Cocaine: NOT DETECTED
Opiates: NOT DETECTED
Tetrahydrocannabinol: NOT DETECTED

## 2020-11-18 LAB — RESP PANEL BY RT-PCR (FLU A&B, COVID) ARPGX2
Influenza A by PCR: NEGATIVE
Influenza B by PCR: NEGATIVE
SARS Coronavirus 2 by RT PCR: NEGATIVE

## 2020-11-18 LAB — TROPONIN I (HIGH SENSITIVITY)
Troponin I (High Sensitivity): 44 ng/L — ABNORMAL HIGH (ref <18)
Troponin I (High Sensitivity): 180 ng/L

## 2020-11-18 LAB — MAGNESIUM: Magnesium: 2 mg/dL (ref 1.7–2.4)

## 2020-11-18 LAB — LIPASE, BLOOD: Lipase: 28 U/L (ref 11–51)

## 2020-11-18 LAB — ETHANOL: Alcohol, Ethyl (B): <10 mg/dL (ref <10)

## 2020-11-18 MED ORDER — NITROGLYCERIN 2 % TD OINT
1.0000 [in_us] | TOPICAL_OINTMENT | Freq: Four times a day (QID) | TRANSDERMAL | Status: DC
  Administered 2020-11-19 – 2020-11-21 (×10): 1 [in_us] via TOPICAL
  Filled 2020-11-18: qty 30

## 2020-11-18 MED ORDER — CARIPRAZINE HCL 1.5 MG PO CAPS
1.5000 mg | ORAL_CAPSULE | Freq: Every day | ORAL | Status: DC
  Administered 2020-11-19 – 2020-11-22 (×4): 1.5 mg via ORAL
  Filled 2020-11-18 (×6): qty 1

## 2020-11-18 MED ORDER — ASPIRIN 325 MG PO TABS
325.0000 mg | ORAL_TABLET | Freq: Once | ORAL | Status: AC
  Administered 2020-11-18: 325 mg via ORAL
  Filled 2020-11-18: qty 1

## 2020-11-18 MED ORDER — MONTELUKAST SODIUM 10 MG PO TABS
10.0000 mg | ORAL_TABLET | Freq: Every day | ORAL | Status: DC
  Administered 2020-11-18 – 2020-11-22 (×5): 10 mg via ORAL
  Filled 2020-11-18 (×5): qty 1

## 2020-11-18 MED ORDER — METOPROLOL TARTRATE 12.5 MG HALF TABLET
12.5000 mg | ORAL_TABLET | Freq: Two times a day (BID) | ORAL | Status: DC
  Administered 2020-11-18 – 2020-11-22 (×8): 12.5 mg via ORAL
  Filled 2020-11-18 (×8): qty 1

## 2020-11-18 MED ORDER — DULOXETINE HCL 60 MG PO CPEP
60.0000 mg | ORAL_CAPSULE | Freq: Two times a day (BID) | ORAL | Status: DC
  Administered 2020-11-19 – 2020-11-22 (×8): 60 mg via ORAL
  Filled 2020-11-18 (×8): qty 1

## 2020-11-18 MED ORDER — HEPARIN (PORCINE) 25000 UT/250ML-% IV SOLN
1750.0000 [IU]/h | INTRAVENOUS | Status: DC
  Administered 2020-11-18 (×2): 1200 [IU]/h via INTRAVENOUS
  Administered 2020-11-19: 1750 [IU]/h via INTRAVENOUS
  Administered 2020-11-19: 1600 [IU]/h via INTRAVENOUS
  Administered 2020-11-20 – 2020-11-21 (×2): 1750 [IU]/h via INTRAVENOUS
  Filled 2020-11-18 (×3): qty 250

## 2020-11-18 MED ORDER — ATORVASTATIN CALCIUM 80 MG PO TABS
80.0000 mg | ORAL_TABLET | Freq: Every day | ORAL | Status: DC
  Administered 2020-11-18 – 2020-11-29 (×12): 80 mg via ORAL
  Filled 2020-11-18 (×7): qty 1
  Filled 2020-11-18: qty 2
  Filled 2020-11-18: qty 1
  Filled 2020-11-18: qty 2
  Filled 2020-11-18: qty 1
  Filled 2020-11-18: qty 2
  Filled 2020-11-18: qty 1

## 2020-11-18 MED ORDER — DULOXETINE HCL 60 MG PO CPEP: 120.0000 mg | ORAL_CAPSULE | Freq: Every day | ORAL | Status: DC

## 2020-11-18 MED ORDER — ALBUTEROL SULFATE (2.5 MG/3ML) 0.083% IN NEBU: 3.0000 mL | INHALATION_SOLUTION | Freq: Four times a day (QID) | RESPIRATORY_TRACT | Status: DC | PRN

## 2020-11-18 MED ORDER — HEPARIN BOLUS VIA INFUSION
4000.0000 [IU] | Freq: Once | INTRAVENOUS | Status: AC
  Administered 2020-11-18: 4000 [IU] via INTRAVENOUS

## 2020-11-18 MED ORDER — ASPIRIN EC 81 MG PO TBEC
81.0000 mg | DELAYED_RELEASE_TABLET | Freq: Every day | ORAL | Status: DC
  Administered 2020-11-19 – 2020-11-22 (×3): 81 mg via ORAL
  Filled 2020-11-18 (×4): qty 1

## 2020-11-18 MED ORDER — BUPROPION HCL ER (XL) 150 MG PO TB24
300.0000 mg | ORAL_TABLET | Freq: Every day | ORAL | Status: DC
  Administered 2020-11-19 – 2020-11-22 (×4): 300 mg via ORAL
  Filled 2020-11-18 (×4): qty 2

## 2020-11-18 MED ORDER — TAMSULOSIN HCL 0.4 MG PO CAPS
0.4000 mg | ORAL_CAPSULE | Freq: Every day | ORAL | Status: DC
  Administered 2020-11-19 – 2020-11-22 (×4): 0.4 mg via ORAL
  Filled 2020-11-18 (×4): qty 1

## 2020-11-18 NOTE — ED Notes (Signed)
Attempted to call report to 6E to let them know [facility] was on the way to pick pt up. Nurse unable to take report d/t shift change. Plan to try again in 15 minutes

## 2020-11-18 NOTE — ED Notes (Signed)
XRay called and stated that pt refused chest xray, told them that he had one done the other day.

## 2020-11-18 NOTE — ED Triage Notes (Signed)
Pt with upper chest pain across with exertion for over the past 2-3 months.  Constant for an hour and half, diminished for awhile and returned.

## 2020-11-18 NOTE — ED Provider Notes (Signed)
[facility] EMERGENCY DEPARTMENT Provider Note   CSN: [id number] Arrival date & time: 11/18/20  1105     History Chief Complaint  Patient presents with   Chest Pain   V70.1    Corey Woods is a 75 y.o. male.  Pt presents to the ED today with cp.  He is a retired Pharmacist, community.  Pt has been having cp with exertion for a few months.  It would always go away when he rested.  Pt said he woke up from sleep with severe cp that would not go away.  He does have a hx of CAD, but has not seen a cardiologist in years.  He has had a stent placed in the past.  He denies sob and n/v.        Past Medical History:  Diagnosis Date   Asthma    Benign bladder tumor    MI (myocardial infarction) ([place])   Essential (primary) hypertension  Unspecified asthma, uncomplicated  Testicular hypofunction  Major depressive disorder, recurrent, moderate (HCC)  Generalized hyperhidrosis  Chronic kidney disease, stage 3a ([place])  Atherosclerotic heart disease of native coronary artery without angina pectoris  Encounter for screening for cardiovascular disorders  Type 2 diabetes mellitus with diabetic chronic kidney disease ([place])  Body mass index (BMI) 29.0-29.9, adult  Overweight  Mixed hyperlipidemia  There are no problems to display for this patient.   Past Surgical History:  Procedure Laterality Date   CORONARY ANGIOPLASTY WITH STENT PLACEMENT     FOOT SURGERY         History reviewed. No pertinent family history.  Social History   Tobacco Use   Smoking status: Former    Types: Cigarettes    Quit date: 2002    Years since quitting: 20.8   Smokeless tobacco: Never  Substance Use Topics   Alcohol use: Yes    Home Medications Prior to Admission medications   Not on File  No bp meds or statins antidepressants  Allergies    Ciprofloxacin  Review of Systems   Review of Systems  Cardiovascular:  Positive for chest pain.  All other systems reviewed and are  negative.  Physical Exam Updated Vital Signs BP (!) 151/80   Pulse 87   Temp 97.6 F (36.4 C) (Oral)   Resp (!) 22   Ht 5\' 10"  (1.778 m)   Wt 115.7 kg   SpO2 96%   BMI 36.59 kg/m   Physical Exam Vitals and nursing note reviewed.  Constitutional:      Appearance: He is well-developed.  HENT:     Head: Normocephalic and atraumatic.  Eyes:     Extraocular Movements: Extraocular movements intact.     Pupils: Pupils are equal, round, and reactive to light.  Cardiovascular:     Rate and Rhythm: Normal rate and regular rhythm.     Heart sounds: Normal heart sounds.  Pulmonary:     Effort: Pulmonary effort is normal.     Breath sounds: Normal breath sounds.  Abdominal:     General: Bowel sounds are normal.     Palpations: Abdomen is soft.  Musculoskeletal:        General: Normal range of motion.     Cervical back: Normal range of motion and neck supple.  Skin:    General: Skin is warm.     Capillary Refill: Capillary refill takes less than 2 seconds.  Neurological:     General: No focal deficit present.     Mental Status:  He is alert and oriented to person, place, and time.  Psychiatric:        Mood and Affect: Mood normal.        Behavior: Behavior normal.    ED Results / Procedures / Treatments   Labs (all labs ordered are listed, but only abnormal results are displayed) Labs Reviewed  BASIC METABOLIC PANEL - Abnormal; Notable for the following components:      Result Value   Sodium 134 (*)    Glucose, Bld 275 (*)    BUN 27 (*)    Creatinine, Ser 1.37 (*)    GFR, Estimated 55 (*)    All other components within normal limits  HEPATIC FUNCTION PANEL - Abnormal; Notable for the following components:   Total Protein 6.3 (*)    All other components within normal limits  TROPONIN I (HIGH SENSITIVITY) - Abnormal; Notable for the following components:   Troponin I (High Sensitivity) 44 (*)    All other components within normal limits  TROPONIN I (HIGH SENSITIVITY) -  Abnormal; Notable for the following components:   Troponin I (High Sensitivity) 180 (*)    All other components within normal limits  CBC  ETHANOL  RAPID URINE DRUG SCREEN, HOSP PERFORMED  LIPASE, BLOOD  MAGNESIUM    EKG EKG Interpretation  Date/Time:  Friday November 18 2020 11:18:32 EST Ventricular Rate:  96 PR Interval:  146 QRS Duration: 104 QT Interval:  352 QTC Calculation: 444 R Axis:   -28 Text Interpretation: Sinus rhythm with occasional Premature ventricular complexes Septal infarct , age undetermined Abnormal ECG No old tracing to compare Confirmed by Isla Pence [phone]) on 11/18/2020 12:38:00 PM  Radiology No results found.  Procedures Procedures   Medications Ordered in ED Medications  aspirin tablet 325 mg (325 mg Oral Given 11/18/20 1356)    ED Course  I have reviewed the triage vital signs and the nursing notes.  Pertinent labs & imaging results that were available during my care of the patient were reviewed by me and considered in my medical decision making (see chart for details).    MDM Rules/Calculators/A&P                           Pt  refused CXR as he had on on 11/15.  I looked at it and it was negative.   Pt's initial troponin .44.  2nd trop up to 180.  The pt has no more cp.  The pt was d/w cards who recommends admission.  They will come see him.  Heparin drip started.  CRITICAL CARE Performed by: Isla Pence   Total critical care time: 30 minutes  Critical care time was exclusive of separately billable procedures and treating other patients.  Critical care was necessary to treat or prevent imminent or life-threatening deterioration.  Critical care was time spent personally by me on the following activities: development of treatment plan with patient and/or surrogate as well as nursing, discussions with consultants, evaluation of patient's response to treatment, examination of patient, obtaining history from patient or  surrogate, ordering and performing treatments and interventions, ordering and review of laboratory studies, ordering and review of radiographic studies, pulse oximetry and re-evaluation of patient's condition.    Final Clinical Impression(s) / ED Diagnoses Final diagnoses:  NSTEMI (non-ST elevated myocardial infarction) ([place])  PVC (premature ventricular contraction)    Rx / DC Orders ED Discharge Orders     None  Isla Pence, MD 11/18/20 1555

## 2020-11-18 NOTE — Progress Notes (Signed)
ANTICOAGULATION CONSULT NOTE - Initial Consult  Pharmacy Consult for heparin Indication: chest pain/ACS  Allergies  Allergen Reactions   Ciprofloxacin     Patient Measurements: Height: 5\' 10"  (177.8 cm) Weight: 115.7 kg (255 lb) IBW/kg (Calculated) : 73 Heparin Dosing Weight: 98 kg  Vital Signs: Temp: 97.6 F (36.4 C) (11/18 1115) Temp Source: Oral (11/18 1115) BP: 151/80 (11/18 1530) Pulse Rate: 87 (11/18 1530)  Labs: Recent Labs    11/18/20 1149 11/18/20 1319  HGB 14.8  --   HCT 43.0  --   PLT 168  --   CREATININE 1.37*  --   TROPONINIHS 44* 180*    Estimated Creatinine Clearance: 62.1 mL/min (A) (by C-G formula based on SCr of 1.37 mg/dL (H)).   Medical History: Past Medical History:  Diagnosis Date   Asthma    Benign bladder tumor    MI (myocardial infarction) ([place])     Medications:  (Not in a hospital admission)   Assessment: Pharmacy consulted to dose heparin in patient with chest pain/ACS.  Patient is not on anticoagulation prior to admission.  Trop 180 CBC WNL  Goal of Therapy:  Heparin level 0.3-0.7 units/ml Monitor platelets by anticoagulation protocol: Yes   Plan:  Give 4000 units bolus x 1 Start heparin infusion at 1200 units/hr Check anti-Xa level in 8 hours and daily while on heparin Continue to monitor H&H and platelets  Margot Ables, PharmD Clinical Pharmacist 11/18/2020 3:56 PM

## 2020-11-18 NOTE — ED Notes (Signed)
Pt states his suicidal thoughts were approx one month ago, but his doctor switched his antidepressant and he hasnt had them since. No plan to act or thought, no plan in the past per pt.

## 2020-11-18 NOTE — H&P (Signed)
Cardiology Admission History and Physical:   Patient ID: Glendon Dunwoody; [id number]; 26-Jan-1945   Admission date: 11/18/2020  Primary Care Provider: Celene Squibb, MD Primary Cardiologist:  New to [facility] Primary Electrophysiologist: None  Chief Complaint: Chest pain  Patient Profile:   Corey Woods is a 75 y.o. male with a history of CAD s/p reported myocardial infarction in 2002, multivessel PCI in 2002, prediabetes, depression, and asthmatic bronchitis now presenting with progressive chest discomfort.  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  He moved from [place] several years ago, lives in [place] with his wife.  He states that over the last 3 months he has experienced worsening exertional chest tightness consistent with angina based on description.  He had to stop walking his dog due to this, has still been able to do some exercises with a personal trainer at home, but has to stop between every exercise due to shortness of breath.  He went out for drinks with friends last night, got up late this morning at which point he was having significant chest tightness that persisted resulting in his visit to the ER today.  He has not had any resting symptoms until today.  He states that he had a heart attack back in 2002 in [place], was treated with what sounds like a multivessel PCI, two BMS and one DES, details are not clear.  He no longer has his stent cards.  He did check his blood pressure this morning when he got up with symptoms, significantly elevated.  He is not on any antihypertensive medications at this point.  Reports being told that he has prediabetes.  Has not smoked cigarettes for greater than 20 years.  He does drink alcohol regularly when he goes out with friends, not at home.  Reports history of myalgias on Crestor previously, tolerated Lipitor.   Past Medical History:  Diagnosis Date   Asthmatic  bronchitis    Benign bladder tumor    CAD (coronary artery disease) 2002   BMS x 2 and DES - [place] (multivessel)   Chronic renal failure, stage 3 (moderate), unspecified whether stage 3a or 3b CKD (HCC)    Depression    MI (myocardial infarction) ([place]) 2002   Orthostatic hypotension 2002   Prediabetes    Statin intolerance    Crestor - myalgias    Past Surgical History:  Procedure Laterality Date   CORONARY ANGIOPLASTY WITH STENT PLACEMENT     FOOT SURGERY     TONSILLECTOMY       Medications Prior to Admission: Outpatient medications are currently being reconciled by pharmacy.  He tells me that he takes a baby aspirin daily and also medications for depression.  Allergies:    Allergies  Allergen Reactions   Ciprofloxacin     Social History:   Social History   Tobacco Use   Smoking status: Former    Types: Cigarettes    Quit date: 2002    Years since quitting: 20.8   Smokeless tobacco: Never  Substance Use Topics   Alcohol use: Yes    Comment: Regularly - out with friends     Family History:   The patient's family history includes Heart disease in his father; Leukemia in his mother; Stroke in his father.    ROS:  Please see the history of present illness.  All other ROS reviewed and negative.     Physical Exam/Data:  Vitals:   11/18/20 1400 11/18/20 1500 11/18/20 1530 11/18/20 1600  BP: (!) 137/115 (!) 151/65 (!) 151/80 113/87  Pulse: 92 85 87 91  Resp: 19 (!) 22 (!) 22 (!) 22  Temp:      TempSrc:      SpO2: 98% 96% 96% 99%  Weight:      Height:       No intake or output data in the 24 hours ending 11/18/20 1640 Filed Weights   11/18/20 1114 11/18/20 1115  Weight: 113.4 kg 115.7 kg   Body mass index is 36.59 kg/m.   Gen: Patient appears comfortable at rest. HEENT: Conjunctiva and lids normal, oropharynx clear. Neck: Supple, no elevated JVP or carotid bruits, no thyromegaly. Lungs: Clear to auscultation, nonlabored breathing at  rest. Cardiac: Regular rate and rhythm, no S3 or significant systolic murmur, no pericardial rub. Abdomen: Soft, nontender, no hepatomegaly, bowel sounds present, no guarding or rebound. Extremities: No pitting edema, distal pulses 2+. Skin: Warm and dry. Musculoskeletal: No kyphosis. Neuropsychiatric: Alert and oriented x3, affect grossly appropriate.  EKG:  An ECG dated 11/18/2020 was personally reviewed today and demonstrated:  Sinus rhythm with leftward axis, PVCs.  Relevant CV Studies:  No prior cardiac testing available for review today.  Laboratory Data:  Chemistry Recent Labs  Lab 11/18/20 1149  NA 134*  K 4.1  CL 100  CO2 25  GLUCOSE 275*  BUN 27*  CREATININE 1.37*  CALCIUM 9.0  GFRNONAA 55*  ANIONGAP 9    Recent Labs  Lab 11/18/20 1319  PROT 6.3*  ALBUMIN 3.6  AST 24  ALT 31  ALKPHOS 59  BILITOT 1.1   Hematology Recent Labs  Lab 11/18/20 1149  WBC 6.3  RBC 4.89  HGB 14.8  HCT 43.0  MCV 87.9  MCH 30.3  MCHC 34.4  RDW 12.3  PLT 168   Cardiac Enzymes Recent Labs  Lab 11/18/20 1149 11/18/20 1319  TROPONINIHS 44* 180*    Radiology/Studies:  Chest x-ray 11/15/2020: FINDINGS: Mild left basilar atelectasis. No focal consolidation. No pleural effusion or pneumothorax. Heart and mediastinal contours are unremarkable.   No acute osseous abnormality.   IMPRESSION: No active cardiopulmonary disease.   Assessment and Plan:   1.  Unstable angina with enzymatic evidence of NSTEMI.  He reports accelerating exertional symptoms over the last 3 months with rest chest pain this morning.  ECG nonspecific.  Currently chest pain-free.  2.  CAD status post reported myocardial infarction in 2002 and apparent multivessel stent intervention at that time, details are unclear.  Procedure was performed in [place].  He does not have stent cards at this time.  Has been on a baby aspirin daily, no statin therapy long-term.  3.  Elevated blood pressure at  presentation, no standing history of essential hypertension.  4.  Uncertain lipid status, prior intolerance to Crestor related to myalgias.  Does recall being able to tolerate Lipitor.  5.  Reported prediabetes.  Random glucose elevated at presentation.  6.  CKD stage III.  Creatinine 1.37.  Situation reviewed with patient.  Recommendation is transfer to our cardiology service at [facility] for further optimization of medical therapy, echocardiogram, and plan for diagnostic cardiac catheterization with eye toward revascularization on Monday.  Continue aspirin, starting IV heparin, initiate beta-blocker, high-dose Lipitor.  Check lipid panel and hemoglobin A1c.  Hold off on ARB for now, if necessary could be initiated post angiography with appropriate follow-up of renal function.  May also be a  good candidate ultimately for SGLT2 inhibitor.  Severity of Illness: The appropriate patient status for this patient is INPATIENT. Inpatient status is judged to be reasonable and necessary in order to provide the required intensity of service to ensure the patient's safety. The patient's presenting symptoms, physical exam findings, and initial radiographic and laboratory data in the context of their chronic comorbidities is felt to place them at high risk for further clinical deterioration. Furthermore, it is not anticipated that the patient will be medically stable for discharge from the hospital within 2 midnights of admission.   * I certify that at the point of admission it is my clinical judgment that the patient will require inpatient hospital care spanning beyond 2 midnights from the point of admission due to high intensity of service, high risk for further deterioration and high frequency of surveillance required.*   Signed, Rozann Lesches, MD  11/18/2020 4:40 PM

## 2020-11-19 ENCOUNTER — Inpatient Hospital Stay (HOSPITAL_COMMUNITY): Payer: Medicare Other

## 2020-11-19 DIAGNOSIS — I214 Non-ST elevation (NSTEMI) myocardial infarction: Principal | ICD-10-CM

## 2020-11-19 DIAGNOSIS — R079 Chest pain, unspecified: Secondary | ICD-10-CM

## 2020-11-19 LAB — HEPARIN LEVEL (UNFRACTIONATED)
Heparin Unfractionated: 0.26 IU/mL — ABNORMAL LOW (ref 0.30–0.70)
Heparin Unfractionated: 0.32 [IU]/mL (ref 0.30–0.70)
Heparin Unfractionated: <0.1 IU/mL — ABNORMAL LOW (ref 0.30–0.70)

## 2020-11-19 LAB — ECHOCARDIOGRAM COMPLETE
Area-P 1/2: 3.48 cm2
Calc EF: 59.6 %
Height: 70 in
MV M vel: 5.09 m/s
MV Peak grad: 103.6 mmHg
Radius: 0.5 cm
S' Lateral: 4.5 cm
Single Plane A2C EF: 48.9 %
Single Plane A4C EF: 68.8 %
Weight: 4129.6 [oz_av]

## 2020-11-19 LAB — BASIC METABOLIC PANEL
Anion gap: 9 (ref 5–15)
BUN: 20 mg/dL (ref 8–23)
CO2: 27 mmol/L (ref 22–32)
Calcium: 9.3 mg/dL (ref 8.9–10.3)
Chloride: 99 mmol/L (ref 98–111)
Creatinine, Ser: 1.48 mg/dL — ABNORMAL HIGH (ref 0.61–1.24)
GFR, Estimated: 50 mL/min — ABNORMAL LOW (ref >60)
Glucose, Bld: 218 mg/dL — ABNORMAL HIGH (ref 70–99)
Potassium: 3.6 mmol/L (ref 3.5–5.1)
Sodium: 135 mmol/L (ref 135–145)

## 2020-11-19 LAB — LIPID PANEL
Cholesterol: 244 mg/dL — ABNORMAL HIGH (ref 0–200)
HDL: 59 mg/dL (ref >40)
LDL Cholesterol: 145 mg/dL — ABNORMAL HIGH (ref 0–99)
Total CHOL/HDL Ratio: 4.1 RATIO
Triglycerides: 198 mg/dL — ABNORMAL HIGH (ref <150)
VLDL: 40 mg/dL (ref 0–40)

## 2020-11-19 LAB — HEMOGLOBIN A1C
Hgb A1c MFr Bld: 6.8 % — ABNORMAL HIGH (ref 4.8–5.6)
Mean Plasma Glucose: 148.46 mg/dL

## 2020-11-19 LAB — CBC
HCT: 43.6 % (ref 39.0–52.0)
Hemoglobin: 15.1 g/dL (ref 13.0–17.0)
MCH: 30.4 pg (ref 26.0–34.0)
MCHC: 34.6 g/dL (ref 30.0–36.0)
MCV: 87.7 fL (ref 80.0–100.0)
Platelets: 204 10*3/uL (ref 150–400)
RBC: 4.97 MIL/uL (ref 4.22–5.81)
RDW: 12.4 % (ref 11.5–15.5)
WBC: 8.1 10*3/uL (ref 4.0–10.5)
nRBC: 0 % (ref 0.0–0.2)

## 2020-11-19 MED ORDER — HEPARIN BOLUS VIA INFUSION
2000.0000 [IU] | Freq: Once | INTRAVENOUS | Status: AC
  Administered 2020-11-19: 2000 [IU] via INTRAVENOUS
  Filled 2020-11-19: qty 2000

## 2020-11-19 MED ORDER — ONDANSETRON HCL 4 MG/2ML IJ SOLN: 4.0000 mg | Freq: Four times a day (QID) | INTRAMUSCULAR | Status: DC | PRN

## 2020-11-19 MED ORDER — PERFLUTREN LIPID MICROSPHERE
1.0000 mL | INTRAVENOUS | Status: AC | PRN
Start: 2020-11-19 — End: 2020-11-19
  Administered 2020-11-19: 2 mL via INTRAVENOUS
  Filled 2020-11-19: qty 10

## 2020-11-19 MED ORDER — NITROGLYCERIN 0.4 MG SL SUBL: 0.4000 mg | SUBLINGUAL_TABLET | SUBLINGUAL | Status: DC | PRN

## 2020-11-19 MED ORDER — PNEUMOCOCCAL VAC POLYVALENT 25 MCG/0.5ML IJ INJ
0.5000 mL | INJECTION | INTRAMUSCULAR | Status: DC | PRN
  Filled 2020-11-19 (×2): qty 0.5

## 2020-11-19 MED ORDER — ACETAMINOPHEN 325 MG PO TABS: 650.0000 mg | ORAL_TABLET | ORAL | Status: DC | PRN

## 2020-11-19 NOTE — Progress Notes (Signed)
ANTICOAGULATION CONSULT NOTE  Pharmacy Consult for heparin Indication: chest pain/ACS  Allergies  Allergen Reactions   Ciprofloxacin     Patient Measurements: Height: 5\' 10"  (177.8 cm) Weight: 117.1 kg (258 lb 1.6 oz) IBW/kg (Calculated) : 73 Heparin Dosing Weight: 98 kg  Vital Signs: Temp: 98.1 F (36.7 C) (11/19 1449) Temp Source: Oral (11/19 1449) BP: 126/85 (11/19 1449) Pulse Rate: 77 (11/19 1449)  Labs: Recent Labs    11/18/20 1149 11/18/20 1319 11/19/20 0021 11/19/20 0801 11/19/20 1640  HGB 14.8  --  15.1  --   --   HCT 43.0  --  43.6  --   --   PLT 168  --  204  --   --   HEPARINUNFRC  --   --  <0.10* 0.32 0.26*  CREATININE 1.37*  --  1.48*  --   --   TROPONINIHS 44* 180*  --   --   --      Estimated Creatinine Clearance: 57.8 mL/min (A) (by C-G formula based on SCr of 1.48 mg/dL (H)).  Assessment: 75 y.o. male with chest pain. LHC and coronary angiography planned for 11/21. Pharmacy to dose heparin.   Heparin level came back slightly subtherapeutic at 0.26, on 1600 units/hr. No s/sx of bleeding or infusion issues per nursing.   Goal of Therapy:  Heparin level 0.3-0.7 units/ml Monitor platelets by anticoagulation protocol: Yes   Plan:  Increase heparin rate to 1750 units/hr to get into goal range  Check heparin level in 8 hours with AM labs. Monitor CBC, HL, and s/sx of bleeding daily   Antonietta Jewel, PharmD, BCCCP Clinical Pharmacist  Phone: [phone] 11/19/2020 6:10 PM  Please check AMION for all [place] phone numbers After 10:00 PM, call [place] [phone]

## 2020-11-19 NOTE — Progress Notes (Signed)
Progress Note  Patient Name: Corey Woods Date of Encounter: 11/19/2020  [facility] Cardiologist: Jamesetta So  Subjective   Currently feeling well without chest pain or shortness of breath.  Anxious about his left heart catheterization.  Inpatient Medications    Scheduled Meds:  aspirin EC  81 mg Oral Daily   atorvastatin  80 mg Oral q1800   buPROPion  300 mg Oral Daily   cariprazine  1.5 mg Oral Daily   DULoxetine  60 mg Oral BID   metoprolol tartrate  12.5 mg Oral BID   montelukast  10 mg Oral QHS   nitroGLYCERIN  1 inch Topical Q6H   tamsulosin  0.4 mg Oral Daily   Continuous Infusions:  heparin 1,600 Units/hr (11/19/20 1004)   PRN Meds: acetaminophen, albuterol, nitroGLYCERIN, ondansetron (ZOFRAN) IV, pneumococcal 23 valent vaccine   Vital Signs    Vitals:   11/18/20 2113 11/18/20 2225 11/18/20 2355 11/19/20 0426  BP:  (!) 165/78 (!) 151/80 126/68  Pulse:  83 79 71  Resp:   20 19  Temp:   97.8 F (36.6 C) 97.7 F (36.5 C)  TempSrc:   Oral Oral  SpO2:  95% 96% 93%  Weight: 117.1 kg     Height: 5\' 10"  (1.778 m)       Intake/Output Summary (Last 24 hours) at 11/19/2020 1108 Last data filed at 11/19/2020 1004 Gross per 24 hour  Intake 1027.08 ml  Output 450 ml  Net 577.08 ml   Last 3 Weights 11/18/2020 11/18/2020 11/18/2020  Weight (lbs) 258 lb 1.6 oz 255 lb 250 lb  Weight (kg) 117.073 kg 115.667 kg 113.399 kg      Telemetry    Sinus rhythm with PVCs- Personally Reviewed  ECG    Sinus rhythm, T wave flattening- Personally Reviewed  Physical Exam   GEN: No acute distress.   Neck: No JVD Cardiac: RRR, no murmurs, rubs, or gallops.  Respiratory: Clear to auscultation bilaterally. GI: Soft, nontender, non-distended  MS: No edema; No deformity. Neuro:  Nonfocal  Psych: Normal affect   Labs    High Sensitivity Troponin:   Recent Labs  Lab 11/18/20 1149 11/18/20 1319  TROPONINIHS 44* 180*     Chemistry Recent Labs  Lab  11/18/20 1149 11/18/20 1319 11/19/20 0021  NA 134*  --  135  K 4.1  --  3.6  CL 100  --  99  CO2 25  --  27  GLUCOSE 275*  --  218*  BUN 27*  --  20  CREATININE 1.37*  --  1.48*  CALCIUM 9.0  --  9.3  MG  --  2.0  --   PROT  --  6.3*  --   ALBUMIN  --  3.6  --   AST  --  24  --   ALT  --  31  --   ALKPHOS  --  59  --   BILITOT  --  1.1  --   GFRNONAA 55*  --  50*  ANIONGAP 9  --  9    Lipids  Recent Labs  Lab 11/19/20 0021  CHOL 244*  TRIG 198*  HDL 59  LDLCALC 145*  CHOLHDL 4.1    Hematology Recent Labs  Lab 11/18/20 1149 11/19/20 0021  WBC 6.3 8.1  RBC 4.89 4.97  HGB 14.8 15.1  HCT 43.0 43.6  MCV 87.9 87.7  MCH 30.3 30.4  MCHC 34.4 34.6  RDW 12.3 12.4  PLT  168 204   Thyroid No results for input(s): TSH, FREET4 in the last 168 hours.  BNPNo results for input(s): BNP, PROBNP in the last 168 hours.  DDimer No results for input(s): DDIMER in the last 168 hours.   Radiology    No results found.  Cardiac Studies   Echo pending  Patient Profile     75 y.o. male presented to the hospital with chest pain consistent with non-STEMI and mild troponin elevation.  Assessment & Plan    1.  Non-STEMI: Had accelerating symptoms.  Very mild troponin elevation.  He does have a history of stents 20 years ago.  With his classic symptoms, left heart catheterization planned for Monday.  Continue IV heparin.  The patient understands that risks include but are not limited to stroke (1 in 1000), death (1 in 59), kidney failure [usually temporary] (1 in 500), bleeding (1 in 200), allergic reaction [possibly serious] (1 in 200), and agrees to proceed.  2.  Hypertension: Currently well controlled  3.  Hyperlipidemia: Continue Lipitor 80 mg.  Goal LDL less than 70.  LDL 145 on a.m. labs.  4.  Prediabetes: Hemoglobin A1c of 6.8.  Has been elevated in clinic.  Casimiro Lienhard need an evaluation as an outpatient.  5.  CKD stage III: Creatinine has remained stable.  For questions  or updates, please contact [place] Please consult [email or web address] for contact info under        Signed, Ned Kakar Meredith Leeds, MD  11/19/2020, 11:08 AM

## 2020-11-19 NOTE — Progress Notes (Signed)
ANTICOAGULATION CONSULT NOTE Pharmacy Consult for heparin Indication: chest pain/ACS  Allergies  Allergen Reactions   Ciprofloxacin     Patient Measurements: Height: 5\' 10"  (177.8 cm) Weight: 117.1 kg (258 lb 1.6 oz) IBW/kg (Calculated) : 73 Heparin Dosing Weight: 98 kg  Vital Signs: Temp: 97.7 F (36.5 C) (11/19 0426) Temp Source: Oral (11/19 0426) BP: 126/68 (11/19 0426) Pulse Rate: 71 (11/19 0426)  Labs: Recent Labs    11/18/20 1149 11/18/20 1319 11/19/20 0021 11/19/20 0801  HGB 14.8  --  15.1  --   HCT 43.0  --  43.6  --   PLT 168  --  204  --   HEPARINUNFRC  --   --  <0.10* 0.32  CREATININE 1.37*  --  1.48*  --   TROPONINIHS 44* 180*  --   --      Estimated Creatinine Clearance: 57.8 mL/min (A) (by C-G formula based on SCr of 1.48 mg/dL (H)).  Assessment: 75 y.o. male with chest pain. LHC and coronary angiography planned for 11/21. Pharmacy to dose heparin.   Heparin level at lower end of therapeutic at 0.32 on 1600 units/hr. Received heparin bolus x2. CBC stable, no s/sx of bleeding noted.   Goal of Therapy:  Heparin level 0.3-0.7 units/ml Monitor platelets by anticoagulation protocol: Yes   Plan:  Continue heparin rate at 1600 units/hr Check heparin level in 8 hours.  Monitor CBC, HL, and s/sx of bleeding daily   Phillis Knack, PharmD, BCPS  11/19/2020 9:22 AM

## 2020-11-19 NOTE — Progress Notes (Signed)
ANTICOAGULATION CONSULT NOTE Pharmacy Consult for heparin Indication: chest pain/ACS  Allergies  Allergen Reactions   Ciprofloxacin     Patient Measurements: Height: 5\' 10"  (177.8 cm) Weight: 117.1 kg (258 lb 1.6 oz) IBW/kg (Calculated) : 73 Heparin Dosing Weight: 98 kg  Vital Signs: Temp: 97.8 F (36.6 C) (11/18 2355) Temp Source: Oral (11/18 2355) BP: 151/80 (11/18 2355) Pulse Rate: 79 (11/18 2355)  Labs: Recent Labs    11/18/20 1149 11/18/20 1319 11/19/20 0021  HGB 14.8  --  15.1  HCT 43.0  --  43.6  PLT 168  --  204  HEPARINUNFRC  --   --  <0.10*  CREATININE 1.37*  --   --   TROPONINIHS 44* 180*  --      Estimated Creatinine Clearance: 62.5 mL/min (A) (by C-G formula based on SCr of 1.37 mg/dL (H)).  Assessment: 75 y.o. male with chest pain for heparin   Goal of Therapy:  Heparin level 0.3-0.7 units/ml Monitor platelets by anticoagulation protocol: Yes   Plan:  Heparin 2000 units IV bolus, then increase heparin 1600 units/hr Check heparin level in 6 hours.   Phillis Knack, PharmD, BCPS  11/19/2020 12:55 AM

## 2020-11-19 NOTE — Plan of Care (Signed)
  Problem: Education: Goal: Knowledge of General Education information will improve Description: Including pain rating scale, medication(s)/side effects and non-pharmacologic comfort measures Outcome: Progressing   Problem: Health Behavior/Discharge Planning: Goal: Ability to manage health-related needs will improve Outcome: Progressing   Problem: Clinical Measurements: Goal: Diagnostic test results will improve Outcome: Progressing   Problem: Activity: Goal: Risk for activity intolerance will decrease Outcome: Progressing   Problem: Pain Managment: Goal: General experience of comfort will improve Outcome: Progressing   Problem: Safety: Goal: Ability to remain free from injury will improve Outcome: Progressing

## 2020-11-20 DIAGNOSIS — I214 Non-ST elevation (NSTEMI) myocardial infarction: Secondary | ICD-10-CM | POA: Diagnosis not present

## 2020-11-20 LAB — CBC
HCT: 40.1 % (ref 39.0–52.0)
Hemoglobin: 13.7 g/dL (ref 13.0–17.0)
MCH: 30.6 pg (ref 26.0–34.0)
MCHC: 34.2 g/dL (ref 30.0–36.0)
MCV: 89.5 fL (ref 80.0–100.0)
Platelets: 179 10*3/uL (ref 150–400)
RBC: 4.48 MIL/uL (ref 4.22–5.81)
RDW: 12.6 % (ref 11.5–15.5)
WBC: 8.1 10*3/uL (ref 4.0–10.5)
nRBC: 0 % (ref 0.0–0.2)

## 2020-11-20 LAB — HEPARIN LEVEL (UNFRACTIONATED): Heparin Unfractionated: 0.38 IU/mL (ref 0.30–0.70)

## 2020-11-20 MED ORDER — SODIUM CHLORIDE 0.9% FLUSH: 3.0000 mL | INTRAVENOUS | Status: DC | PRN

## 2020-11-20 MED ORDER — SODIUM CHLORIDE 0.9 % IV SOLN: 250.0000 mL | INTRAVENOUS | Status: DC | PRN

## 2020-11-20 MED ORDER — MELATONIN 5 MG PO TABS
10.0000 mg | ORAL_TABLET | Freq: Every evening | ORAL | Status: DC | PRN
  Administered 2020-11-20 – 2020-11-21 (×3): 10 mg via ORAL
  Filled 2020-11-20 (×3): qty 2

## 2020-11-20 MED ORDER — SODIUM CHLORIDE 0.9% FLUSH
3.0000 mL | Freq: Two times a day (BID) | INTRAVENOUS | Status: DC
  Administered 2020-11-21: 3 mL via INTRAVENOUS

## 2020-11-20 MED ORDER — SODIUM CHLORIDE 0.9 % IV SOLN: INTRAVENOUS | Status: DC

## 2020-11-20 MED ORDER — ASPIRIN 81 MG PO CHEW
81.0000 mg | CHEWABLE_TABLET | ORAL | Status: AC
  Administered 2020-11-21: 81 mg via ORAL

## 2020-11-20 MED ORDER — MAGNESIUM HYDROXIDE 400 MG/5ML PO SUSP
15.0000 mL | Freq: Every day | ORAL | Status: DC | PRN
  Administered 2020-11-20 – 2020-11-22 (×3): 15 mL via ORAL
  Filled 2020-11-20 (×3): qty 30

## 2020-11-20 NOTE — Plan of Care (Signed)

## 2020-11-20 NOTE — Progress Notes (Signed)
Progress Note  Patient Name: Corey Woods Date of Encounter: 11/20/2020  [facility] Cardiologist: Jamesetta So  Subjective   Currently feeling well without chest pain.  Anxious for left heart catheterization tomorrow.  Would like sedation during the procedure.  Inpatient Medications    Scheduled Meds:  aspirin EC  81 mg Oral Daily   atorvastatin  80 mg Oral q1800   buPROPion  300 mg Oral Daily   cariprazine  1.5 mg Oral Daily   DULoxetine  60 mg Oral BID   metoprolol tartrate  12.5 mg Oral BID   montelukast  10 mg Oral QHS   nitroGLYCERIN  1 inch Topical Q6H   sodium chloride flush  3 mL Intravenous Q12H   tamsulosin  0.4 mg Oral Daily   Continuous Infusions:  heparin 1,750 Units/hr (11/19/20 2233)   PRN Meds: acetaminophen, albuterol, melatonin, nitroGLYCERIN, ondansetron (ZOFRAN) IV, pneumococcal 23 valent vaccine   Vital Signs    Vitals:   11/19/20 1449 11/19/20 1926 11/20/20 0300 11/20/20 0924  BP: 126/85 117/75 131/67 (!) 129/57  Pulse: 77 84 73 79  Resp: 20 (!) 22 (!) 21 18  Temp: 98.1 F (36.7 C) 97.6 F (36.4 C) (!) 97.5 F (36.4 C) 97.7 F (36.5 C)  TempSrc: Oral Oral Oral Oral  SpO2: 97% 95% 94%   Weight:   115.9 kg   Height:        Intake/Output Summary (Last 24 hours) at 11/20/2020 0941 Last data filed at 11/20/2020 0700 Gross per 24 hour  Intake 1499.37 ml  Output 1750 ml  Net -250.63 ml    Last 3 Weights 11/20/2020 11/18/2020 11/18/2020  Weight (lbs) 255 lb 8.2 oz 258 lb 1.6 oz 255 lb  Weight (kg) 115.9 kg 117.073 kg 115.667 kg      Telemetry    Sinus rhythm with PVCs-personally reviewed  ECG    None new  Physical Exam   GEN: Well nourished, well developed, in no acute distress  HEENT: normal  Neck: no JVD, carotid bruits, or masses Cardiac: RRR; no murmurs, rubs, or gallops,no edema  Respiratory:  clear to auscultation bilaterally, normal work of breathing GI: soft, nontender, nondistended, + BS MS: no  deformity or atrophy  Skin: warm and dry Neuro:  Strength and sensation are intact Psych: euthymic mood, full affect   Labs    High Sensitivity Troponin:   Recent Labs  Lab 11/18/20 1149 11/18/20 1319  TROPONINIHS 44* 180*      Chemistry Recent Labs  Lab 11/18/20 1149 11/18/20 1319 11/19/20 0021  NA 134*  --  135  K 4.1  --  3.6  CL 100  --  99  CO2 25  --  27  GLUCOSE 275*  --  218*  BUN 27*  --  20  CREATININE 1.37*  --  1.48*  CALCIUM 9.0  --  9.3  MG  --  2.0  --   PROT  --  6.3*  --   ALBUMIN  --  3.6  --   AST  --  24  --   ALT  --  31  --   ALKPHOS  --  59  --   BILITOT  --  1.1  --   GFRNONAA 55*  --  50*  ANIONGAP 9  --  9     Lipids  Recent Labs  Lab 11/19/20 0021  CHOL 244*  TRIG 198*  HDL 59  LDLCALC 145*  CHOLHDL 4.1  Hematology Recent Labs  Lab 11/18/20 1149 11/19/20 0021 11/20/20 0151  WBC 6.3 8.1 8.1  RBC 4.89 4.97 4.48  HGB 14.8 15.1 13.7  HCT 43.0 43.6 40.1  MCV 87.9 87.7 89.5  MCH 30.3 30.4 30.6  MCHC 34.4 34.6 34.2  RDW 12.3 12.4 12.6  PLT 168 204 179    Thyroid No results for input(s): TSH, FREET4 in the last 168 hours.  BNPNo results for input(s): BNP, PROBNP in the last 168 hours.  DDimer No results for input(s): DDIMER in the last 168 hours.   Radiology    ECHOCARDIOGRAM COMPLETE  Result Date: 11/19/2020    ECHOCARDIOGRAM REPORT   Patient Name:   Corey Woods Date of Exam: 11/19/2020 Medical Rec #:  [id number]          Height:       70.0 in Accession #:    [id number]         Weight:       258.1 lb Date of Birth:  December 25, 1945         BSA:          2.326 m Patient Age:    75 years           BP:           126/68 mmHg Patient Gender: M                  HR:           69 bpm. Exam Location:  Inpatient Procedure: 2D Echo, 3D Echo, Cardiac Doppler, Color Doppler and Intracardiac            Opacification Agent Indications:    R07.9* Chest pain, unspecified; 122-I22.9 Subsequent ST                 elevation (STEM) and  non-ST elevation (NSTEMI) myocardial                 infarction  History:        Patient has no prior history of Echocardiogram examinations.                 Acute MI and CAD; Signs/Symptoms:Chest Pain and Hypotension.  Sonographer:    Roseanna Rainbow RDCS Referring Phys: [id number] [facility]  Sonographer Comments: Technically difficult study due to poor echo windows. Image acquisition challenging due to patient body habitus. IMPRESSIONS  1. Left ventricular ejection fraction, by estimation, is 40 to 45%. The left ventricle has mild to moderately decreased function. The left ventricle has no regional wall motion abnormalities. Left ventricular diastolic parameters are consistent with Grade I diastolic dysfunction (impaired relaxation).  2. Right ventricular systolic function is normal. The right ventricular size is normal.  3. The mitral valve is normal in structure. Mild to moderate mitral valve regurgitation. No evidence of mitral stenosis.  4. The aortic valve is tricuspid. Aortic valve regurgitation is not visualized. Aortic valve sclerosis is present, with no evidence of aortic valve stenosis.  5. The inferior vena cava is normal in size with greater than 50% respiratory variability, suggesting right atrial pressure of 3 mmHg. FINDINGS  Left Ventricle: Left ventricular ejection fraction, by estimation, is 40 to 45%. The left ventricle has mild to moderately decreased function. The left ventricle has no regional wall motion abnormalities. Definity contrast agent was given IV to delineate the left ventricular endocardial borders. The left ventricular internal cavity size was normal in size. There is borderline left ventricular hypertrophy.  Left ventricular diastolic parameters are consistent with Grade I diastolic dysfunction (impaired relaxation). Right Ventricle: The right ventricular size is normal. Right ventricular systolic function is normal. Left Atrium: Left atrial size was normal in size. Right Atrium:  Right atrial size was normal in size. Pericardium: There is no evidence of pericardial effusion. Mitral Valve: The mitral valve is normal in structure. Mild mitral annular calcification. Mild to moderate mitral valve regurgitation. No evidence of mitral valve stenosis. Tricuspid Valve: The tricuspid valve is normal in structure. Tricuspid valve regurgitation is trivial. No evidence of tricuspid stenosis. Aortic Valve: The aortic valve is tricuspid. Aortic valve regurgitation is not visualized. Aortic valve sclerosis is present, with no evidence of aortic valve stenosis. Pulmonic Valve: The pulmonic valve was normal in structure. Pulmonic valve regurgitation is trivial. No evidence of pulmonic stenosis. Aorta: The aortic root is normal in size and structure. Venous: The inferior vena cava is normal in size with greater than 50% respiratory variability, suggesting right atrial pressure of 3 mmHg. IAS/Shunts: No atrial level shunt detected by color flow Doppler.  LEFT VENTRICLE PLAX 2D LVIDd:         5.30 cm      Diastology LVIDs:         4.50 cm      LV e' medial:    5.87 cm/s LV PW:         1.20 cm      LV E/e' medial:  11.3 LV IVS:        1.10 cm      LV e' lateral:   8.16 cm/s LVOT diam:     2.40 cm      LV E/e' lateral: 8.2 LV SV:         95 LV SV Index:   41 LVOT Area:     4.52 cm                              3D Volume EF: LV Volumes (MOD)            3D EF:        40 % LV vol d, MOD A2C: 139.0 ml LV EDV:       147 ml LV vol d, MOD A4C: 176.0 ml LV ESV:       88 ml LV vol s, MOD A2C: 71.0 ml  LV SV:        59 ml LV vol s, MOD A4C: 55.0 ml LV SV MOD A2C:     68.0 ml LV SV MOD A4C:     176.0 ml LV SV MOD BP:      98.3 ml RIGHT VENTRICLE             IVC RV S prime:     11.70 cm/s  IVC diam: 2.00 cm TAPSE (M-mode): 2.0 cm LEFT ATRIUM             Index        RIGHT ATRIUM          Index LA diam:        3.60 cm 1.55 cm/m   RA Area:     9.15 cm LA Vol (A2C):   54.7 ml 23.51 ml/m  RA Volume:   18.30 ml 7.87 ml/m LA  Vol (A4C):   22.6 ml 9.72 ml/m LA Biplane Vol: 36.3 ml 15.60 ml/m  AORTIC VALVE  PULMONIC VALVE LVOT Vmax:   94.90 cm/s  PR End Diast Vel: 1.25 msec LVOT Vmean:  65.000 cm/s LVOT VTI:    0.209 m  AORTA Ao Root diam: 3.60 cm Ao Asc diam:  3.40 cm MITRAL VALVE MV Area (PHT): 3.48 cm       SHUNTS MV Decel Time: 218 msec       Systemic VTI:  0.21 m MR Peak grad:    103.6 mmHg   Systemic Diam: 2.40 cm MR Mean grad:    64.0 mmHg MR Vmax:         509.00 cm/s MR Vmean:        376.0 cm/s MR PISA:         1.57 cm MR PISA Eff ROA: 12 mm MR PISA Radius:  0.50 cm MV E velocity: 66.60 cm/s MV A velocity: 96.90 cm/s MV E/A ratio:  0.69 Kirk Ruths MD Electronically signed by Kirk Ruths MD Signature Date/Time: 11/19/2020/2:13:10 PM    Final     Cardiac Studies   Echo pending  Patient Profile     75 y.o. male presented to the hospital with chest pain consistent with non-STEMI and mild troponin elevation.  Assessment & Plan    1.  Non-STEMI: Patient had accelerating symptoms with a mild troponin elevation.  He had stents 20 years ago.  We Bard Haupert plan for left heart catheterization tomorrow.  We Evelin Cake continue IV heparin.  2.  Hypertension: Currently well controlled  3.  Hyperlipidemia: Goal LDL less than 70.  145 on a.m. labs.  Continue high-dose Lipitor.  4.  Prediabetes: A1c of 6.8.  Corey Woods need outpatient evaluation.  5.  CKD stage III: Creatinine has remained stable.   For questions or updates, please contact [place] Please consult [email or web address] for contact info under        Signed, Sharolyn Weber Meredith Leeds, MD  11/20/2020, 9:41 AM

## 2020-11-20 NOTE — Progress Notes (Signed)
ANTICOAGULATION CONSULT NOTE  Pharmacy Consult for heparin Indication: chest pain/ACS  Allergies  Allergen Reactions   Ciprofloxacin     Patient Measurements: Height: 5\' 10"  (177.8 cm) Weight: 115.9 kg (255 lb 8.2 oz) IBW/kg (Calculated) : 73 Heparin Dosing Weight: 98 kg  Vital Signs: Temp: 97.5 F (36.4 C) (11/20 0300) Temp Source: Oral (11/20 0300) BP: 131/67 (11/20 0300) Pulse Rate: 73 (11/20 0300)  Labs: Recent Labs    11/18/20 1149 11/18/20 1149 11/18/20 1319 11/19/20 0021 11/19/20 0801 11/19/20 1640 11/20/20 0151  HGB 14.8  --   --  15.1  --   --  13.7  HCT 43.0  --   --  43.6  --   --  40.1  PLT 168  --   --  204  --   --  179  HEPARINUNFRC  --    < >  --  <0.10* 0.32 0.26* 0.38  CREATININE 1.37*  --   --  1.48*  --   --   --   TROPONINIHS 44*  --  180*  --   --   --   --    < > = values in this interval not displayed.     Estimated Creatinine Clearance: 57.6 mL/min (A) (by C-G formula based on SCr of 1.48 mg/dL (H)).  Assessment: 75 y.o. male with chest pain. LHC and coronary angiography planned for 11/21. Pharmacy to dose heparin.   Heparin level therapeutic at 0.38 on 1750 units/hr. CBC stable, no s/sx of bleeding noted.   Goal of Therapy:  Heparin level 0.3-0.7 units/ml Monitor platelets by anticoagulation protocol: Yes   Plan:  Continue heparin at 1750 units/hr  Monitor CBC, HL, and s/sx of bleeding daily   Joseph Art, Pharm.D. PGY-1 Pharmacy Resident [phone] 11/20/2020 8:40 AM

## 2020-11-21 ENCOUNTER — Other Ambulatory Visit (HOSPITAL_COMMUNITY): Payer: Self-pay

## 2020-11-21 ENCOUNTER — Encounter (HOSPITAL_COMMUNITY)
Admission: EM | Disposition: A | Discharge status: Home / Self Care | Payer: Self-pay | Source: Home / Self Care | Attending: Thoracic Surgery (Cardiothoracic Vascular Surgery)

## 2020-11-21 DIAGNOSIS — I25110 Atherosclerotic heart disease of native coronary artery with unstable angina pectoris: Secondary | ICD-10-CM | POA: Diagnosis not present

## 2020-11-21 DIAGNOSIS — I2510 Atherosclerotic heart disease of native coronary artery without angina pectoris: Secondary | ICD-10-CM | POA: Diagnosis not present

## 2020-11-21 DIAGNOSIS — I214 Non-ST elevation (NSTEMI) myocardial infarction: Secondary | ICD-10-CM | POA: Diagnosis not present

## 2020-11-21 HISTORY — PX: LEFT HEART CATH AND CORONARY ANGIOGRAPHY: CATH118249

## 2020-11-21 LAB — CBC
HCT: 41.5 % (ref 39.0–52.0)
Hemoglobin: 14 g/dL (ref 13.0–17.0)
MCH: 30.2 pg (ref 26.0–34.0)
MCHC: 33.7 g/dL (ref 30.0–36.0)
MCV: 89.6 fL (ref 80.0–100.0)
Platelets: 184 10*3/uL (ref 150–400)
RBC: 4.63 MIL/uL (ref 4.22–5.81)
RDW: 12.7 % (ref 11.5–15.5)
WBC: 7.1 10*3/uL (ref 4.0–10.5)
nRBC: 0 % (ref 0.0–0.2)

## 2020-11-21 LAB — GLUCOSE, CAPILLARY
Glucose-Capillary: 154 mg/dL — ABNORMAL HIGH (ref 70–99)
Glucose-Capillary: 157 mg/dL — ABNORMAL HIGH (ref 70–99)
Glucose-Capillary: 174 mg/dL — ABNORMAL HIGH (ref 70–99)
Glucose-Capillary: 117 mg/dL — ABNORMAL HIGH (ref 70–99)

## 2020-11-21 LAB — BASIC METABOLIC PANEL
Anion gap: 8 (ref 5–15)
BUN: 24 mg/dL — ABNORMAL HIGH (ref 8–23)
CO2: 29 mmol/L (ref 22–32)
Calcium: 9.3 mg/dL (ref 8.9–10.3)
Chloride: 99 mmol/L (ref 98–111)
Creatinine, Ser: 1.53 mg/dL — ABNORMAL HIGH (ref 0.61–1.24)
GFR, Estimated: 48 mL/min — ABNORMAL LOW (ref >60)
Glucose, Bld: 151 mg/dL — ABNORMAL HIGH (ref 70–99)
Potassium: 4.2 mmol/L (ref 3.5–5.1)
Sodium: 136 mmol/L (ref 135–145)

## 2020-11-21 LAB — HEPARIN LEVEL (UNFRACTIONATED): Heparin Unfractionated: 0.43 IU/mL (ref 0.30–0.70)

## 2020-11-21 SURGERY — LEFT HEART CATH AND CORONARY ANGIOGRAPHY
Anesthesia: LOCAL

## 2020-11-21 MED ORDER — IOHEXOL 350 MG/ML SOLN
INTRAVENOUS | Status: DC | PRN
  Administered 2020-11-21: 45 mL

## 2020-11-21 MED ORDER — SODIUM CHLORIDE 0.9 % IV SOLN: INTRAVENOUS | Status: DC

## 2020-11-21 MED ORDER — INSULIN ASPART 100 UNIT/ML IJ SOLN
0.0000 [IU] | Freq: Three times a day (TID) | INTRAMUSCULAR | Status: DC
  Administered 2020-11-21 – 2020-11-22 (×3): 2 [IU] via SUBCUTANEOUS

## 2020-11-21 MED ORDER — FENTANYL CITRATE (PF) 100 MCG/2ML IJ SOLN
INTRAMUSCULAR | Status: DC | PRN
  Administered 2020-11-21: 25 ug via INTRAVENOUS

## 2020-11-21 MED ORDER — SODIUM CHLORIDE 0.9% FLUSH: 3.0000 mL | INTRAVENOUS | Status: DC | PRN

## 2020-11-21 MED ORDER — VERAPAMIL HCL 2.5 MG/ML IV SOLN
INTRAVENOUS | Status: DC | PRN
  Administered 2020-11-21: 10 mL via INTRA_ARTERIAL

## 2020-11-21 MED ORDER — SODIUM CHLORIDE 0.9 % WEIGHT BASED INFUSION
1.0000 mL/kg/h | INTRAVENOUS | Status: AC
  Administered 2020-11-21: 1 mL/kg/h via INTRAVENOUS

## 2020-11-21 MED ORDER — HEPARIN SODIUM (PORCINE) 1000 UNIT/ML IJ SOLN
INTRAMUSCULAR | Status: AC
  Filled 2020-11-21: qty 1

## 2020-11-21 MED ORDER — LIDOCAINE HCL (PF) 1 % IJ SOLN
INTRAMUSCULAR | Status: AC
  Filled 2020-11-21: qty 30

## 2020-11-21 MED ORDER — MIDAZOLAM HCL 2 MG/2ML IJ SOLN
INTRAMUSCULAR | Status: DC | PRN
  Administered 2020-11-21: 2 mg via INTRAVENOUS

## 2020-11-21 MED ORDER — SODIUM CHLORIDE 0.9% FLUSH
3.0000 mL | Freq: Two times a day (BID) | INTRAVENOUS | Status: DC
  Administered 2020-11-22: 3 mL via INTRAVENOUS

## 2020-11-21 MED ORDER — HEPARIN (PORCINE) IN NACL 1000-0.9 UT/500ML-% IV SOLN
INTRAVENOUS | Status: AC
  Filled 2020-11-21: qty 500

## 2020-11-21 MED ORDER — LIDOCAINE HCL (PF) 1 % IJ SOLN
INTRAMUSCULAR | Status: DC | PRN
  Administered 2020-11-21: 2 mL

## 2020-11-21 MED ORDER — HEPARIN (PORCINE) IN NACL 1000-0.9 UT/500ML-% IV SOLN
INTRAVENOUS | Status: DC | PRN
  Administered 2020-11-21 (×2): 500 mL

## 2020-11-21 MED ORDER — VERAPAMIL HCL 2.5 MG/ML IV SOLN
INTRAVENOUS | Status: AC
  Filled 2020-11-21: qty 2

## 2020-11-21 MED ORDER — FENTANYL CITRATE (PF) 100 MCG/2ML IJ SOLN
INTRAMUSCULAR | Status: AC
  Filled 2020-11-21: qty 2

## 2020-11-21 MED ORDER — MIDAZOLAM HCL 2 MG/2ML IJ SOLN
INTRAMUSCULAR | Status: AC
  Filled 2020-11-21: qty 2

## 2020-11-21 MED ORDER — HEPARIN SODIUM (PORCINE) 1000 UNIT/ML IJ SOLN
INTRAMUSCULAR | Status: DC | PRN
  Administered 2020-11-21: 5000 [IU] via INTRAVENOUS

## 2020-11-21 MED ORDER — HEPARIN (PORCINE) 25000 UT/250ML-% IV SOLN
1750.0000 [IU]/h | INTRAVENOUS | Status: DC
  Administered 2020-11-21 – 2020-11-23 (×3): 1750 [IU]/h via INTRAVENOUS
  Filled 2020-11-21 (×4): qty 250

## 2020-11-21 MED ORDER — SODIUM CHLORIDE 0.9 % IV SOLN: 250.0000 mL | INTRAVENOUS | Status: DC | PRN

## 2020-11-21 MED ORDER — HYDRALAZINE HCL 20 MG/ML IJ SOLN: 10.0000 mg | INTRAMUSCULAR | Status: AC | PRN

## 2020-11-21 SURGICAL SUPPLY — 9 items
CATH 5FR JL3.5 JR4 ANG PIG MP (CATHETERS) ×2 IMPLANT
DEVICE RAD COMP TR BAND LRG (VASCULAR PRODUCTS) ×2 IMPLANT
GLIDESHEATH SLEND SS 6F .021 (SHEATH) ×2 IMPLANT
GUIDEWIRE INQWIRE 1.5J.035X260 (WIRE) ×1 IMPLANT
INQWIRE 1.5J .035X260CM (WIRE) ×2
KIT HEART LEFT (KITS) ×2 IMPLANT
PACK CARDIAC CATHETERIZATION (CUSTOM PROCEDURE TRAY) ×2 IMPLANT
TRANSDUCER W/STOPCOCK (MISCELLANEOUS) ×2 IMPLANT
TUBING CIL FLEX 10 FLL-RA (TUBING) ×2 IMPLANT

## 2020-11-21 NOTE — Progress Notes (Signed)
ANTICOAGULATION CONSULT NOTE  Pharmacy Consult for heparin Indication: chest pain/ACS  Allergies  Allergen Reactions   Ciprofloxacin     Patient Measurements: Height: 5\' 10"  (177.8 cm) Weight: 114.3 kg (252 lb) IBW/kg (Calculated) : 73 Heparin Dosing Weight: 98 kg  Vital Signs: Temp: 97.6 F (36.4 C) (11/21 0835) Temp Source: Oral (11/21 0835) BP: 143/64 (11/21 1007) Pulse Rate: 69 (11/21 1007)  Labs: Recent Labs    11/19/20 0021 11/19/20 0801 11/19/20 1640 11/20/20 0151 11/21/20 0136  HGB 15.1  --   --  13.7 14.0  HCT 43.6  --   --  40.1 41.5  PLT 204  --   --  179 184  HEPARINUNFRC <0.10*   < > 0.26* 0.38 0.43  CREATININE 1.48*  --   --   --  1.53*   < > = values in this interval not displayed.     Estimated Creatinine Clearance: 55.2 mL/min (A) (by C-G formula based on SCr of 1.53 mg/dL (H)).  Assessment: 75 y.o. male with chest pain. LHC and coronary angiography planned for 11/21. Pharmacy to dose heparin.   Heparin level therapeutic at 0.43 on 1750 units/hr. CBC stable, no s/sx of bleeding noted.   Patient noted to have 3 vessel cad on cath. Surgery is being consulted.   Goal of Therapy:  Heparin level 0.3-0.7 units/ml Monitor platelets by anticoagulation protocol: Yes   Plan:  Restart heparin at 1750 units/hr tonight Heparin level in am Monitor CBC, HL, and s/sx of bleeding daily   Erin Hearing PharmD., BCPS Clinical Pharmacist 11/21/2020 1:40 PM

## 2020-11-21 NOTE — Progress Notes (Signed)
ANTICOAGULATION CONSULT NOTE  Pharmacy Consult for heparin Indication: chest pain/ACS  Allergies  Allergen Reactions   Ciprofloxacin     Patient Measurements: Height: 5\' 10"  (177.8 cm) Weight: 114.3 kg (252 lb) IBW/kg (Calculated) : 73 Heparin Dosing Weight: 98 kg  Vital Signs: Temp: 97.6 F (36.4 C) (11/21 0542) Temp Source: Oral (11/21 0542) BP: 145/82 (11/21 0548) Pulse Rate: 72 (11/21 0542)  Labs: Recent Labs    11/18/20 1149 11/18/20 1319 11/19/20 0021 11/19/20 0801 11/19/20 1640 11/20/20 0151 11/21/20 0136  HGB 14.8  --  15.1  --   --  13.7 14.0  HCT 43.0  --  43.6  --   --  40.1 41.5  PLT 168  --  204  --   --  179 184  HEPARINUNFRC  --   --  <0.10*   < > 0.26* 0.38 0.43  CREATININE 1.37*  --  1.48*  --   --   --   --   TROPONINIHS 44* 180*  --   --   --   --   --    < > = values in this interval not displayed.     Estimated Creatinine Clearance: 57.1 mL/min (A) (by C-G formula based on SCr of 1.48 mg/dL (H)).  Assessment: 75 y.o. male with chest pain. LHC and coronary angiography planned for 11/21. Pharmacy to dose heparin.   Heparin level therapeutic at 0.43 on 1750 units/hr. CBC stable, no s/sx of bleeding noted.   Goal of Therapy:  Heparin level 0.3-0.7 units/ml Monitor platelets by anticoagulation protocol: Yes   Plan:  Continue heparin at 1750 units/hr  Monitor CBC, HL, and s/sx of bleeding daily   Erin Hearing PharmD., BCPS Clinical Pharmacist 11/21/2020 7:50 AM

## 2020-11-21 NOTE — Progress Notes (Signed)
Progress Note  Patient Name: Corey Woods Date of Encounter: 11/21/2020  [facility] Cardiologist: Jamesetta So  Subjective   No chest pain  Inpatient Medications    Scheduled Meds:  aspirin EC  81 mg Oral Daily   atorvastatin  80 mg Oral q1800   buPROPion  300 mg Oral Daily   cariprazine  1.5 mg Oral Daily   DULoxetine  60 mg Oral BID   insulin aspart  0-9 Units Subcutaneous TID WC   metoprolol tartrate  12.5 mg Oral BID   montelukast  10 mg Oral QHS   nitroGLYCERIN  1 inch Topical Q6H   sodium chloride flush  3 mL Intravenous Q12H   tamsulosin  0.4 mg Oral Daily   Continuous Infusions:  sodium chloride     sodium chloride     heparin 1,750 Units/hr (11/21/20 0834)   PRN Meds: sodium chloride, acetaminophen, albuterol, magnesium hydroxide, melatonin, nitroGLYCERIN, ondansetron (ZOFRAN) IV, pneumococcal 23 valent vaccine, sodium chloride flush   Vital Signs    Vitals:   11/21/20 0542 11/21/20 0548 11/21/20 0808 11/21/20 0835  BP: (!) 120/100 (!) 145/82 (!) 168/78 (!) 168/78  Pulse: 72  69 69  Resp: 17 17 16 15   Temp: 97.6 F (36.4 C)   97.6 F (36.4 C)  TempSrc: Oral   Oral  SpO2: 98%     Weight: 114.3 kg     Height:        Intake/Output Summary (Last 24 hours) at 11/21/2020 0958 Last data filed at 11/21/2020 0835 Gross per 24 hour  Intake 444.85 ml  Output 3150 ml  Net -2705.15 ml   Last 3 Weights 11/21/2020 11/20/2020 11/18/2020  Weight (lbs) 252 lb 255 lb 8.2 oz 258 lb 1.6 oz  Weight (kg) 114.306 kg 115.9 kg 117.073 kg      Telemetry    Sinus - personally reviewed  ECG    None new  Physical Exam   General: Well developed, well nourished, NAD  HEENT: OP clear, mucus membranes moist  SKIN: warm, dry. No rashes. Neuro: No focal deficits  Musculoskeletal: Muscle strength 5/5 all ext  Psychiatric: Mood and affect normal  Neck: No JVD, no carotid bruits, no thyromegaly, no lymphadenopathy.  Lungs:Clear bilaterally, no wheezes,  rhonci, crackles Cardiovascular: Regular rate and rhythm. No murmurs, gallops or rubs. Abdomen:Soft. Bowel sounds present. Non-tender.  Extremities: No lower extremity edema.    Labs    High Sensitivity Troponin:   Recent Labs  Lab 11/18/20 1149 11/18/20 1319  TROPONINIHS 44* 180*     Chemistry Recent Labs  Lab 11/18/20 1149 11/18/20 1319 11/19/20 0021 11/21/20 0136  NA 134*  --  135 136  K 4.1  --  3.6 4.2  CL 100  --  99 99  CO2 25  --  27 29  GLUCOSE 275*  --  218* 151*  BUN 27*  --  20 24*  CREATININE 1.37*  --  1.48* 1.53*  CALCIUM 9.0  --  9.3 9.3  MG  --  2.0  --   --   PROT  --  6.3*  --   --   ALBUMIN  --  3.6  --   --   AST  --  24  --   --   ALT  --  31  --   --   ALKPHOS  --  59  --   --   BILITOT  --  1.1  --   --  GFRNONAA 55*  --  50* 48*  ANIONGAP 9  --  9 8    Lipids  Recent Labs  Lab 11/19/20 0021  CHOL 244*  TRIG 198*  HDL 59  LDLCALC 145*  CHOLHDL 4.1    Hematology Recent Labs  Lab 11/19/20 0021 11/20/20 0151 11/21/20 0136  WBC 8.1 8.1 7.1  RBC 4.97 4.48 4.63  HGB 15.1 13.7 14.0  HCT 43.6 40.1 41.5  MCV 87.7 89.5 89.6  MCH 30.4 30.6 30.2  MCHC 34.6 34.2 33.7  RDW 12.4 12.6 12.7  PLT 204 179 184   Thyroid No results for input(s): TSH, FREET4 in the last 168 hours.  BNPNo results for input(s): BNP, PROBNP in the last 168 hours.  DDimer No results for input(s): DDIMER in the last 168 hours.   Radiology    ECHOCARDIOGRAM COMPLETE  Result Date: 11/19/2020    ECHOCARDIOGRAM REPORT   Patient Name:   Corey Woods Date of Exam: 11/19/2020 Medical Rec #:  [id number]          Height:       70.0 in Accession #:    [id number]         Weight:       258.1 lb Date of Birth:  1945/10/24         BSA:          2.326 m Patient Age:    6 years           BP:           126/68 mmHg Patient Gender: M                  HR:           69 bpm. Exam Location:  Inpatient Procedure: 2D Echo, 3D Echo, Cardiac Doppler, Color Doppler and Intracardiac             Opacification Agent Indications:    R07.9* Chest pain, unspecified; 122-I22.9 Subsequent ST                 elevation (STEM) and non-ST elevation (NSTEMI) myocardial                 infarction  History:        Patient has no prior history of Echocardiogram examinations.                 Acute MI and CAD; Signs/Symptoms:Chest Pain and Hypotension.  Sonographer:    Roseanna Rainbow RDCS Referring Phys: [id number] [facility]  Sonographer Comments: Technically difficult study due to poor echo windows. Image acquisition challenging due to patient body habitus. IMPRESSIONS  1. Left ventricular ejection fraction, by estimation, is 40 to 45%. The left ventricle has mild to moderately decreased function. The left ventricle has no regional wall motion abnormalities. Left ventricular diastolic parameters are consistent with Grade I diastolic dysfunction (impaired relaxation).  2. Right ventricular systolic function is normal. The right ventricular size is normal.  3. The mitral valve is normal in structure. Mild to moderate mitral valve regurgitation. No evidence of mitral stenosis.  4. The aortic valve is tricuspid. Aortic valve regurgitation is not visualized. Aortic valve sclerosis is present, with no evidence of aortic valve stenosis.  5. The inferior vena cava is normal in size with greater than 50% respiratory variability, suggesting right atrial pressure of 3 mmHg. FINDINGS  Left Ventricle: Left ventricular ejection fraction, by estimation, is 40 to 45%. The left ventricle has  mild to moderately decreased function. The left ventricle has no regional wall motion abnormalities. Definity contrast agent was given IV to delineate the left ventricular endocardial borders. The left ventricular internal cavity size was normal in size. There is borderline left ventricular hypertrophy. Left ventricular diastolic parameters are consistent with Grade I diastolic dysfunction (impaired relaxation). Right Ventricle: The right  ventricular size is normal. Right ventricular systolic function is normal. Left Atrium: Left atrial size was normal in size. Right Atrium: Right atrial size was normal in size. Pericardium: There is no evidence of pericardial effusion. Mitral Valve: The mitral valve is normal in structure. Mild mitral annular calcification. Mild to moderate mitral valve regurgitation. No evidence of mitral valve stenosis. Tricuspid Valve: The tricuspid valve is normal in structure. Tricuspid valve regurgitation is trivial. No evidence of tricuspid stenosis. Aortic Valve: The aortic valve is tricuspid. Aortic valve regurgitation is not visualized. Aortic valve sclerosis is present, with no evidence of aortic valve stenosis. Pulmonic Valve: The pulmonic valve was normal in structure. Pulmonic valve regurgitation is trivial. No evidence of pulmonic stenosis. Aorta: The aortic root is normal in size and structure. Venous: The inferior vena cava is normal in size with greater than 50% respiratory variability, suggesting right atrial pressure of 3 mmHg. IAS/Shunts: No atrial level shunt detected by color flow Doppler.  LEFT VENTRICLE PLAX 2D LVIDd:         5.30 cm      Diastology LVIDs:         4.50 cm      LV e' medial:    5.87 cm/s LV PW:         1.20 cm      LV E/e' medial:  11.3 LV IVS:        1.10 cm      LV e' lateral:   8.16 cm/s LVOT diam:     2.40 cm      LV E/e' lateral: 8.2 LV SV:         95 LV SV Index:   41 LVOT Area:     4.52 cm                              3D Volume EF: LV Volumes (MOD)            3D EF:        40 % LV vol d, MOD A2C: 139.0 ml LV EDV:       147 ml LV vol d, MOD A4C: 176.0 ml LV ESV:       88 ml LV vol s, MOD A2C: 71.0 ml  LV SV:        59 ml LV vol s, MOD A4C: 55.0 ml LV SV MOD A2C:     68.0 ml LV SV MOD A4C:     176.0 ml LV SV MOD BP:      98.3 ml RIGHT VENTRICLE             IVC RV S prime:     11.70 cm/s  IVC diam: 2.00 cm TAPSE (M-mode): 2.0 cm LEFT ATRIUM             Index        RIGHT ATRIUM           Index LA diam:        3.60 cm 1.55 cm/m   RA Area:     9.15 cm LA  Vol [facility]):   54.7 ml 23.51 ml/m  RA Volume:   18.30 ml 7.87 ml/m LA Vol (A4C):   22.6 ml 9.72 ml/m LA Biplane Vol: 36.3 ml 15.60 ml/m  AORTIC VALVE             PULMONIC VALVE LVOT Vmax:   94.90 cm/s  PR End Diast Vel: 1.25 msec LVOT Vmean:  65.000 cm/s LVOT VTI:    0.209 m  AORTA Ao Root diam: 3.60 cm Ao Asc diam:  3.40 cm MITRAL VALVE MV Area (PHT): 3.48 cm       SHUNTS MV Decel Time: 218 msec       Systemic VTI:  0.21 m MR Peak grad:    103.6 mmHg   Systemic Diam: 2.40 cm MR Mean grad:    64.0 mmHg MR Vmax:         509.00 cm/s MR Vmean:        376.0 cm/s MR PISA:         1.57 cm MR PISA Eff ROA: 12 mm MR PISA Radius:  0.50 cm MV E velocity: 66.60 cm/s MV A velocity: 96.90 cm/s MV E/A ratio:  0.69 Kirk Ruths MD Electronically signed by Kirk Ruths MD Signature Date/Time: 11/19/2020/2:13:10 PM    Final     Cardiac Studies   Echo pending  Patient Profile     75 y.o. male with CAD, HTN, HLD, elevated blood sugar and CKD stage 3 admitted with chest pain, troponin elevation c/w a NSTEMI.   Assessment & Plan    1.  CAD/Non-STEMI: Admitted with chest pain concerning for angina. Mild troponin elevation. Plans for cardiac cath today. I have reviewed the risks, indications, and alternatives to cardiac catheterization, possible angioplasty, and stenting with the patient. Risks include but are not limited to bleeding, infection, vascular injury, stroke, myocardial infection, arrhythmia, kidney injury, radiation-related injury in the case of prolonged fluoroscopy use, emergency cardiac surgery, and death. The patient understands the risks of serious complication is 1-2 in 6967 with diagnostic cardiac cath and 1-2% or less with angioplasty/stenting.  -Continue ASA, statin, beta blocker and IV heparin -Remove NTG past pre cath  2.  Hypertension: BP elevated. Will titrate medical therapy post cath  3.  Hyperlipidemia: Continue  statin  4.  Prediabetes: A1c of 6.8.  Will need outpatient evaluation.  5.  CKD stage III: Creatinine has remained stable.   For questions or updates, please contact [place] Please consult [email or web address] for contact info under        Signed, Lauree Chandler, MD  11/21/2020, 9:58 AM

## 2020-11-21 NOTE — Progress Notes (Signed)
Patient taken to cath lab at 1250hrs.

## 2020-11-21 NOTE — Progress Notes (Signed)
Patient returned fromcath lab at 1345hrs. Reviewed post cath instructions.  Patient verbalized understanding.  TR band to right wrist, level zero.

## 2020-11-21 NOTE — Consult Note (Addendum)
[address]       [place] [address]             [id number]        Corey Woods [place] Medical Record #[id number] Date of Birth: [birth year]  Referring: No ref. 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  He also has a known history of coronary artery disease having a heart attack in 2002 and subsequent percutaneous interventions.  He is unsure about which vessels were treated and what procedures were used.  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  He reported having chest pain off and on for the past few months but was awakened from sleep on 11/19/2020 with chest pain that was protracted.  In emergency room, EKG showed sinus rhythm with a few PVCs.  There was evidence of a septal infarct, age undetermined.  Initial troponin was 44 and a subsequent lab a few hours later was 180.  Having ruled in for non-ST elevation myocardial infarction, cardiology consult was requested.  Patient was seen by Corey Woods and admitted to the hospital.  He was started on heparin infusion.  He was transferred to [facility] for further evaluation.  He remained stable with no further chest pain.  Left heart catheterization was conducted earlier today and demonstrates severe three-vessel coronary artery disease. CT surgery has been asked to evaluate Corey Woods for consideration of coronary bypass grafting. Corey Woods is resting in bed following his left heart catheterization earlier today.  He tells me he has had persistent cough and prior to the admission but has not had any coughing in the last 24 hours.   He denies having any lower extremity varicosities or symptoms of claudication.  He has no current dental issues.  His last dental visit was in the past 6 months.    Current Activity/ Functional Status: Patient is independent with mobility/ambulation, transfers, ADL's, IADL's.   Zubrod Score: At the time of surgery this patient's most appropriate activity status/level should be described as: []     0    Normal activity, no symptoms [x]     1    Restricted in physical strenuous activity but ambulatory, able to do out light work []     2    Ambulatory and capable of self care, unable to do work activities, up and about                 more than 50%  Of the time                            []     3    Only limited self care, in bed greater than 50% of waking hours []     4    Completely disabled, no self care, confined to bed or chair []     5    Moribund  Past Medical History:  Diagnosis Date   Asthmatic bronchitis    Benign bladder tumor    CAD (coronary artery disease) 2002   BMS x 2  and DES - [place] (multivessel)   Chronic renal failure, stage 3 (moderate), unspecified whether stage 3a or 3b CKD ([place])    Depression    MI (myocardial infarction) ([place]) 2002   Orthostatic hypotension 2002   Prediabetes    Statin intolerance    Crestor - myalgias    Past Surgical History:  Procedure Laterality Date   CORONARY ANGIOPLASTY WITH STENT PLACEMENT     FOOT SURGERY     TONSILLECTOMY      Social History   Tobacco Use  Smoking Status Former   Types: Cigarettes   Quit date: 2002   Years since quitting: 20.9  Smokeless Tobacco Never    Social History   Substance and Sexual Activity  Alcohol Use Yes   Comment: Regularly - out with friends     Allergies  Allergen Reactions   Ciprofloxacin     Current Facility-Administered Medications  Medication Dose Route Frequency Provider Last Rate Last Admin   0.9 %  sodium chloride infusion   Intravenous Continuous Martinique, Peter M, MD    Stopped at 11/21/20 1358   0.9 %  sodium chloride infusion  250 mL Intravenous PRN Martinique, Peter M, MD       0.9% sodium chloride infusion  1 mL/kg/hr Intravenous Continuous Martinique, Peter M, MD 114.3 mL/hr at 11/21/20 1357 1 mL/kg/hr at 11/21/20 1357   acetaminophen (TYLENOL) tablet 650 mg  650 mg Oral Q4H PRN Martinique, Peter M, MD       albuterol (PROVENTIL) (2.5 MG/3ML) 0.083% nebulizer solution 3 mL  3 mL Inhalation Q6H PRN Martinique, Peter M, MD       aspirin EC tablet 81 mg  81 mg Oral Daily Martinique, Peter M, MD   81 mg at 11/20/20 1036   atorvastatin (LIPITOR) tablet 80 mg  80 mg Oral q1800 Martinique, Peter M, MD   80 mg at 11/20/20 1802   buPROPion (WELLBUTRIN XL) 24 hr tablet 300 mg  300 mg Oral Daily Martinique, Peter M, MD   300 mg at 11/21/20 1006   cariprazine (VRAYLAR) capsule 1.5 mg  1.5 mg Oral Daily Martinique, Peter M, MD   1.5 mg at 11/21/20 1006   DULoxetine (CYMBALTA) DR capsule 60 mg  60 mg Oral BID Martinique, Peter M, MD   60 mg at 11/21/20 1006   heparin ADULT infusion 100 units/mL (25000 units/251mL)  1,750 Units/hr Intravenous Continuous Lyndee Leo, RPH       hydrALAZINE (APRESOLINE) injection 10 mg  10 mg Intravenous Q20 Min PRN Martinique, Peter M, MD       insulin aspart (novoLOG) injection 0-9 Units  0-9 Units Subcutaneous TID WC Martinique, Peter M, MD   2 Units at 11/21/20 1209   magnesium hydroxide (MILK OF MAGNESIA) suspension 15 mL  15 mL Oral Daily PRN Martinique, Peter M, MD   15 mL at 11/20/20 2107   melatonin tablet 10 mg  10 mg Oral QHS PRN Martinique, Peter M, MD   10 mg at 11/21/20 0002   metoprolol tartrate (LOPRESSOR) tablet 12.5 mg  12.5 mg Oral BID Martinique, Peter M, MD   12.5 mg at 11/21/20 1007   montelukast (SINGULAIR) tablet 10 mg  10 mg Oral QHS Martinique, Peter M, MD   10 mg at 11/20/20 2053   nitroGLYCERIN (NITROSTAT) SL tablet 0.4 mg  0.4 mg Sublingual Q5 Min x 3 PRN Martinique, Peter M, MD       ondansetron [facility]) injection 4 mg  4  mg Intravenous Q6H PRN Martinique, Peter M, MD        pneumococcal 23 valent vaccine (PNEUMOVAX-23) injection 0.5 mL  0.5 mL Intramuscular Prior to discharge Satira Sark, MD       sodium chloride flush (NS) 0.9 % injection 3 mL  3 mL Intravenous Q12H Martinique, Peter M, MD       sodium chloride flush (NS) 0.9 % injection 3 mL  3 mL Intravenous Q12H Martinique, Peter M, MD       sodium chloride flush (NS) 0.9 % injection 3 mL  3 mL Intravenous PRN Martinique, Peter M, MD       tamsulosin [facility]) capsule 0.4 mg  0.4 mg Oral Daily Martinique, Peter M, MD   0.4 mg at 11/20/20 2053    Medications Prior to Admission  Medication Sig Dispense Refill Last Dose   albuterol (VENTOLIN HFA) 108 (90 Base) MCG/ACT inhaler albuterol sulfate HFA 90 mcg/actuation aerosol inhaler  Inhale 2 puffs every 4 hours by inhalation route as needed.   11/18/2020   buPROPion (WELLBUTRIN XL) 300 MG 24 hr tablet Take 300 mg by mouth daily.   11/17/2020   cariprazine (VRAYLAR) 1.5 MG capsule Take 1.5 mg by mouth daily.   11/17/2020   DULoxetine (CYMBALTA) 60 MG capsule Take 120 mg by mouth daily.   11/17/2020   montelukast (SINGULAIR) 10 MG tablet Take 10 mg by mouth at bedtime.   11/17/2020   tamsulosin (FLOMAX) 0.4 MG CAPS capsule Take 0.4 mg by mouth daily.   11/17/2020   TESTOSTERONE AQUEOUS IM Inject 400 mg into the muscle See admin instructions. 400 mg once a week every Friday   11/11/2020    Family History  Problem Relation Age of Onset   Leukemia Mother    Stroke Father    Heart disease Father      Review of Systems:     Cardiac Review of Systems: Y or  [    ]= no  Chest Pain [  x  ]  Resting SOB [   ] Exertional SOB  [ x ]  Orthopnea [  ]   Pedal Edema [   ]    Palpitations [  ] Syncope  [  ]   Presyncope [   ]  General Review of Systems: [Y] = yes [  ]=no Constitional: recent weight change [  ]; anorexia [  ]; fatigue [  ]; nausea [  ]; night sweats [  ]; fever [  ]; or chills [  ]                                                               Dental: Last Dentist  visit: Past 6 months.   Eye : blurred vision [  ]; diplopia [   ]; vision changes [  ];  Amaurosis fugax[  ]; Resp: cough [ x ];  wheezing[  ];  hemoptysis[  ]; shortness of breath[  ]; paroxysmal nocturnal dyspnea[  ]; dyspnea on exertion[  ]; or orthopnea[  ];  GI:  gallstones[  ], vomiting[  ];  dysphagia[  ]; melena[  ];  hematochezia [  ]; heartburn[  ];   Hx of  Colonoscopy[  ]; GU: kidney stones [  ];  hematuria[  ];   dysuria [  ];  nocturia[  ];  history of     obstruction [  ]; urinary frequency [  ]             Skin: rash, swelling[  ];, hair loss[  ];  peripheral edema[  ];  or itching[  ]; Musculosketetal: myalgias[  ];  joint swelling[  ];  joint erythema[  ];  joint pain[  ];  back pain[  ];  Heme/Lymph: bruising[  ];  bleeding[  ];  anemia[  ];  Neuro: TIA[  ];  headaches[  ];  stroke[  ];  vertigo[  ];  seizures[  ];   paresthesias[  ];  difficulty walking[  ];  Psych:depression[  ]; anxiety[  ];  Endocrine: diabetes[ x ];  thyroid dysfunction[  ];                  Physical Exam: BP (!) 165/87 (BP Location: Left Arm)   Pulse 78   Temp 97.8 F (36.6 C) (Oral)   Resp 19   Ht 5\' 10"  (1.778 m)   Wt 114.3 kg   SpO2 97%   BMI 36.16 kg/m    General appearance: alert, cooperative, and no distress Head: Normocephalic, without obvious abnormality, atraumatic Neck: no adenopathy, no carotid bruit, no JVD, and supple, symmetrical, trachea midline Lymph nodes: No cervical or clavicular adenopathy Resp: clear to auscultation bilaterally Cardio: Regular rate and rhythm, no murmur.  Monitor shows normal sinus rhythm GI: Abdomen is soft and nontender.  Hypoactive bowel sounds. Extremities: There is a TR band over the right radial artery.  Both hands are warm and well-perfused with palpable pulses.  Bilateral DP and PT pulses are palpable.  There are no obvious deformities.  No obvious varicosities in the lower extremities. Neurologic: Grossly normal  Diagnostic Studies &  Laboratory data:  LEFT HEART CATH AND CORONARY ANGIOGRAPHY   Conclusion      Prox LAD to Mid LAD lesion is 95% stenosed.   Mid LAD lesion is 50% stenosed.   Dist LAD lesion is 80% stenosed.   1st Diag lesion is 90% stenosed.   Mid Cx to Dist Cx lesion is 80% stenosed.   2nd Mrg lesion is 75% stenosed.   3rd Mrg lesion is 75% stenosed.   Prox RCA to Mid RCA lesion is 90% stenosed.   Mid RCA to Dist RCA lesion is 100% stenosed.   LV end diastolic pressure is normal.   Severe 3 vessel obstructive CAD Normal LVEDP   Plan: CT surgery consult for CABG   Recommendations  Antiplatelet/Anticoag Recommend Aspirin 81mg  daily for moderate CAD.   Indications  Non-ST elevation (NSTEMI) myocardial infarction ([place]) [I21.4 (ICD-10-CM)]   Procedural Details  Technical Details Indication: 75 yo WM with remote MI with stenting in 2002 presents with NSTEMI. EF 40-45% by Echo  Procedural Details: The right wrist was prepped, draped, and anesthetized with 1% lidocaine. Using the modified Seldinger technique, a 6 French slender sheath was introduced into the right radial artery. 3 mg of verapamil was administered through the sheath, weight-based unfractionated heparin was administered intravenously. Standard Judkins catheters were used for selective coronary angiography and left ventriculography. Catheter exchanges were performed over an exchange length guidewire. Unfortunately due to equipment malfunction the case was done all on 10 inch magnification. There were no immediate procedural complications. A TR band was used for radial hemostasis at the completion of the procedure.  The patient  was transferred to the post catheterization recovery area for further monitoring.  Contrast: 45 cc    Estimated blood loss <50 mL.   During this procedure medications were administered to achieve and maintain moderate conscious sedation while the patient's heart rate, blood pressure, and oxygen saturation were  continuously monitored and I was present face-to-face 100% of this time.      Contrast  Medication Name Total Dose  iohexol (OMNIPAQUE) 350 MG/ML injection 45 mL    Radiation/Fluoro  Fluoro time: 2.2 (min) DAP: 41409 (mGycm2) Cumulative Air Kerma: 563 (mGy)   Complications   Complications documented before study signed (11/21/2020  8:75 PM)    No complications were associated with this study.  Documented by Martinique, Peter M, MD - 11/21/2020  1:35 PM     Coronary Findings   Diagnostic Dominance: Right  Left Anterior Descending  Prox LAD to Mid LAD lesion is 95% stenosed. The lesion is segmental.  Mid LAD lesion is 50% stenosed.  Dist LAD lesion is 80% stenosed.    First Diagonal Branch  1st Diag lesion is 90% stenosed.    Left Circumflex  Mid Cx to Dist Cx lesion is 80% stenosed.    Second Obtuse Marginal Branch  2nd Mrg lesion is 75% stenosed.    Third Obtuse Marginal Branch  3rd Mrg lesion is 75% stenosed.    Right Coronary Artery  Prox RCA to Mid RCA lesion is 90% stenosed.  Mid RCA to Dist RCA lesion is 100% stenosed. The lesion was previously treated using a stent (unknown type) over 2 years ago.    Right Posterior Descending Artery  Collaterals  RPDA filled by collaterals from Dist LAD.       Intervention    No interventions have been documented.   Left Heart  Left Ventricle LV end diastolic pressure is normal.   Coronary Diagrams   Diagnostic Dominance: Right        ECHOCARDIOGRAM REPORT         Patient Name:   Corey Woods Date of Exam: 11/19/2020  Medical Rec #:  [id number]          Height:       70.0 in  Accession #:    [id number]         Weight:       258.1 lb  Date of Birth:  03-06-1945         BSA:          2.326 m  Patient Age:    74 years           BP:           126/68 mmHg  Patient Gender: M                  HR:           69 bpm.  Exam Location:  Inpatient   Procedure: 2D Echo, 3D Echo, Cardiac Doppler,  Color Doppler and  Intracardiac             Opacification Agent   Indications:    R07.9* Chest pain, unspecified; 122-I22.9 Subsequent ST                  elevation (STEM) and non-ST elevation (NSTEMI) myocardial                  infarction     History:        Patient has no prior history of  Echocardiogram  examinations.                  Acute MI and CAD; Signs/Symptoms:Chest Pain and  Hypotension.     Sonographer:    Roseanna Rainbow RDCS  Referring Phys: [id number] [facility]      Sonographer Comments: Technically difficult study due to poor echo  windows. Image acquisition challenging due to patient body habitus.  IMPRESSIONS     1. Left ventricular ejection fraction, by estimation, is 40 to 45%. The  left ventricle has mild to moderately decreased function. The left  ventricle has no regional wall motion abnormalities. Left ventricular  diastolic parameters are consistent with  Grade I diastolic dysfunction (impaired relaxation).   2. Right ventricular systolic function is normal. The right ventricular  size is normal.   3. The mitral valve is normal in structure. Mild to moderate mitral valve  regurgitation. No evidence of mitral stenosis.   4. The aortic valve is tricuspid. Aortic valve regurgitation is not  visualized. Aortic valve sclerosis is present, with no evidence of aortic  valve stenosis.   5. The inferior vena cava is normal in size with greater than 50%  respiratory variability, suggesting right atrial pressure of 3 mmHg.   FINDINGS   Left Ventricle: Left ventricular ejection fraction, by estimation, is 40  to 45%. The left ventricle has mild to moderately decreased function. The  left ventricle has no regional wall motion abnormalities. Definity  contrast agent was given IV to  delineate the left ventricular endocardial borders. The left ventricular  internal cavity size was normal in size. There is borderline left  ventricular hypertrophy. Left ventricular  diastolic parameters are  consistent with Grade I diastolic dysfunction  (impaired relaxation).   Right Ventricle: The right ventricular size is normal. Right ventricular  systolic function is normal.   Left Atrium: Left atrial size was normal in size.   Right Atrium: Right atrial size was normal in size.   Pericardium: There is no evidence of pericardial effusion.   Mitral Valve: The mitral valve is normal in structure. Mild mitral annular  calcification. Mild to moderate mitral valve regurgitation. No evidence of  mitral valve stenosis.   Tricuspid Valve: The tricuspid valve is normal in structure. Tricuspid  valve regurgitation is trivial. No evidence of tricuspid stenosis.   Aortic Valve: The aortic valve is tricuspid. Aortic valve regurgitation is  not visualized. Aortic valve sclerosis is present, with no evidence of  aortic valve stenosis.   Pulmonic Valve: The pulmonic valve was normal in structure. Pulmonic valve  regurgitation is trivial. No evidence of pulmonic stenosis.   Aorta: The aortic root is normal in size and structure.   Venous: The inferior vena cava is normal in size with greater than 50%  respiratory variability, suggesting right atrial pressure of 3 mmHg.   IAS/Shunts: No atrial level shunt detected by color flow Doppler.      LEFT VENTRICLE  PLAX 2D  LVIDd:         5.30 cm      Diastology  LVIDs:         4.50 cm      LV e' medial:    5.87 cm/s  LV PW:         1.20 cm      LV E/e' medial:  11.3  LV IVS:        1.10 cm      LV e' lateral:   8.16 cm/s  LVOT diam:     2.40 cm      LV E/e' lateral: 8.2  LV SV:         95  LV SV Index:   41  LVOT Area:     4.52 cm                                 3D Volume EF:  LV Volumes (MOD)            3D EF:        40 %  LV vol d, MOD A2C: 139.0 ml LV EDV:       147 ml  LV vol d, MOD A4C: 176.0 ml LV ESV:       88 ml  LV vol s, MOD A2C: 71.0 ml  LV SV:        59 ml  LV vol s, MOD A4C: 55.0 ml  LV SV MOD A2C:      68.0 ml  LV SV MOD A4C:     176.0 ml  LV SV MOD BP:      98.3 ml   RIGHT VENTRICLE             IVC  RV S prime:     11.70 cm/s  IVC diam: 2.00 cm  TAPSE (M-mode): 2.0 cm   LEFT ATRIUM             Index        RIGHT ATRIUM          Index  LA diam:        3.60 cm 1.55 cm/m   RA Area:     9.15 cm  LA Vol (A2C):   54.7 ml 23.51 ml/m  RA Volume:   18.30 ml 7.87 ml/m  LA Vol (A4C):   22.6 ml 9.72 ml/m  LA Biplane Vol: 36.3 ml 15.60 ml/m   AORTIC VALVE             PULMONIC VALVE  LVOT Vmax:   94.90 cm/s  PR End Diast Vel: 1.25 msec  LVOT Vmean:  65.000 cm/s  LVOT VTI:    0.209 m     AORTA  Ao Root diam: 3.60 cm  Ao Asc diam:  3.40 cm   MITRAL VALVE  MV Area (PHT): 3.48 cm       SHUNTS  MV Decel Time: 218 msec       Systemic VTI:  0.21 m  MR Peak grad:    103.6 mmHg   Systemic Diam: 2.40 cm  MR Mean grad:    64.0 mmHg  MR Vmax:         509.00 cm/s  MR Vmean:        376.0 cm/s  MR PISA:         1.57 cm  MR PISA Eff ROA: 12 mm  MR PISA Radius:  0.50 cm  MV E velocity: 66.60 cm/s  MV A velocity: 96.90 cm/s  MV E/A ratio:  0.69   Corey Ruths MD  Electronically signed by Corey Ruths MD  Signature Date/Time: 11/19/2020/2:13:10 PM        Recent Radiology Findings:    Dyspnea with activity worsening over the last few weeks.   EXAM: CHEST - 2 VIEW   COMPARISON:  None.   FINDINGS: Mild left basilar atelectasis. No focal consolidation. No pleural effusion or pneumothorax. Heart and mediastinal  contours are unremarkable.   No acute osseous abnormality.   IMPRESSION: No active cardiopulmonary disease.     Electronically Signed   By: Corey Woods M.D.   On: 11/16/2020 11:19    I have independently reviewed the above radiologic studies and discussed with the patient   Recent Lab Findings: Lab Results  Component Value Date   WBC 7.1 11/21/2020   HGB 14.0 11/21/2020   HCT 41.5 11/21/2020   PLT 184 11/21/2020   GLUCOSE 151 (H) 11/21/2020   CHOL 244  (H) 11/19/2020   TRIG 198 (H) 11/19/2020   HDL 59 11/19/2020   LDLCALC 145 (H) 11/19/2020   ALT 31 11/18/2020   AST 24 11/18/2020   NA 136 11/21/2020   K 4.2 11/21/2020   CL 99 11/21/2020   CREATININE 1.53 (H) 11/21/2020   BUN 24 (H) 11/21/2020   CO2 29 11/21/2020   HGBA1C 6.8 (H) 11/19/2020      Assessment / Plan:    Pleasant 75 year old gentleman with multiple cardiac risk factors and with exertional angina that sounds to have been present for several months. He had an episode of chest pain that awakened him from sleep a few days ago that persisted and prompted his visit to [facility] ED.  He ruled in for non-ST elevation myocardial infarction with minor bump in his high-sensitivity troponin.  After transfer to [facility] from [facility], left heart catheterization was performed earlier today demonstrating severe three-vessel coronary artery disease.  Echocardiogram shows left ventricular ejection fraction of 40 to 45%.  In addition, there is mild to moderate mitral insufficiency but no other significant valvular lesions were identified.   He appears to have reasonable distal targets for grafting. Coronary bypass grafting is his best option for revascularization.  The coronary bypass procedure was described to Corey Woods along with the expected perioperative course. He would like for [place] to proceed with the preoperative work-up in preparation for surgery.  He does have a history of chronic kidney disease and his creatinine has bumped slightly from a baseline of 1.3 on admission to 1.5 this morning.  He has, of course, been exposed to IV contrast since that lab was drawn.  We will need to recheck his renal function prior to surgery.  We will also obtain carotid Dopplers and peripheral arterial screening as well. [name] will see Corey Woods and reviewed his clinical data.  Discussion regarding timing of surgery will follow.     I  spent 25 minutes counseling  the patient face to face.  Corey Odea, PA-C    11/21/2020 3:11 PM    Agree with above.  This is a 74 year old gentleman with a history of coronary artery disease who presents with an NSTEMI.  Left heart cath shows severe three-vessel coronary disease.  His echocardiogram shows mildly reduced left ventricular function, and preserved right ventricular function.  There is no significant valvular disease.  On review of his images all of his coronaries are small however given his presentation surgical revascularization is his only option.  He is agreeable to proceed and is tentatively scheduled for 11/23/2020.  Corey Woods

## 2020-11-21 NOTE — H&P (View-Only) (Signed)
Progress Note  Patient Name: Corey Woods Date of Encounter: 11/21/2020  [facility] Cardiologist: Jamesetta So  Subjective   No chest pain  Inpatient Medications    Scheduled Meds:  aspirin EC  81 mg Oral Daily   atorvastatin  80 mg Oral q1800   buPROPion  300 mg Oral Daily   cariprazine  1.5 mg Oral Daily   DULoxetine  60 mg Oral BID   insulin aspart  0-9 Units Subcutaneous TID WC   metoprolol tartrate  12.5 mg Oral BID   montelukast  10 mg Oral QHS   nitroGLYCERIN  1 inch Topical Q6H   sodium chloride flush  3 mL Intravenous Q12H   tamsulosin  0.4 mg Oral Daily   Continuous Infusions:  sodium chloride     sodium chloride     heparin 1,750 Units/hr (11/21/20 0834)   PRN Meds: sodium chloride, acetaminophen, albuterol, magnesium hydroxide, melatonin, nitroGLYCERIN, ondansetron (ZOFRAN) IV, pneumococcal 23 valent vaccine, sodium chloride flush   Vital Signs    Vitals:   11/21/20 0542 11/21/20 0548 11/21/20 0808 11/21/20 0835  BP: (!) 120/100 (!) 145/82 (!) 168/78 (!) 168/78  Pulse: 72  69 69  Resp: 17 17 16 15   Temp: 97.6 F (36.4 C)   97.6 F (36.4 C)  TempSrc: Oral   Oral  SpO2: 98%     Weight: 114.3 kg     Height:        Intake/Output Summary (Last 24 hours) at 11/21/2020 0958 Last data filed at 11/21/2020 0835 Gross per 24 hour  Intake 444.85 ml  Output 3150 ml  Net -2705.15 ml   Last 3 Weights 11/21/2020 11/20/2020 11/18/2020  Weight (lbs) 252 lb 255 lb 8.2 oz 258 lb 1.6 oz  Weight (kg) 114.306 kg 115.9 kg 117.073 kg      Telemetry    Sinus - personally reviewed  ECG    None new  Physical Exam   General: Well developed, well nourished, NAD  HEENT: OP clear, mucus membranes moist  SKIN: warm, dry. No rashes. Neuro: No focal deficits  Musculoskeletal: Muscle strength 5/5 all ext  Psychiatric: Mood and affect normal  Neck: No JVD, no carotid bruits, no thyromegaly, no lymphadenopathy.  Lungs:Clear bilaterally, no wheezes,  rhonci, crackles Cardiovascular: Regular rate and rhythm. No murmurs, gallops or rubs. Abdomen:Soft. Bowel sounds present. Non-tender.  Extremities: No lower extremity edema.    Labs    High Sensitivity Troponin:   Recent Labs  Lab 11/18/20 1149 11/18/20 1319  TROPONINIHS 44* 180*     Chemistry Recent Labs  Lab 11/18/20 1149 11/18/20 1319 11/19/20 0021 11/21/20 0136  NA 134*  --  135 136  K 4.1  --  3.6 4.2  CL 100  --  99 99  CO2 25  --  27 29  GLUCOSE 275*  --  218* 151*  BUN 27*  --  20 24*  CREATININE 1.37*  --  1.48* 1.53*  CALCIUM 9.0  --  9.3 9.3  MG  --  2.0  --   --   PROT  --  6.3*  --   --   ALBUMIN  --  3.6  --   --   AST  --  24  --   --   ALT  --  31  --   --   ALKPHOS  --  59  --   --   BILITOT  --  1.1  --   --  GFRNONAA 55*  --  50* 48*  ANIONGAP 9  --  9 8    Lipids  Recent Labs  Lab 11/19/20 0021  CHOL 244*  TRIG 198*  HDL 59  LDLCALC 145*  CHOLHDL 4.1    Hematology Recent Labs  Lab 11/19/20 0021 11/20/20 0151 11/21/20 0136  WBC 8.1 8.1 7.1  RBC 4.97 4.48 4.63  HGB 15.1 13.7 14.0  HCT 43.6 40.1 41.5  MCV 87.7 89.5 89.6  MCH 30.4 30.6 30.2  MCHC 34.6 34.2 33.7  RDW 12.4 12.6 12.7  PLT 204 179 184   Thyroid No results for input(s): TSH, FREET4 in the last 168 hours.  BNPNo results for input(s): BNP, PROBNP in the last 168 hours.  DDimer No results for input(s): DDIMER in the last 168 hours.   Radiology    ECHOCARDIOGRAM COMPLETE  Result Date: 11/19/2020    ECHOCARDIOGRAM REPORT   Patient Name:   Corey Woods Date of Exam: 11/19/2020 Medical Rec #:  [id number]          Height:       70.0 in Accession #:    [id number]         Weight:       258.1 lb Date of Birth:  1945-10-05         BSA:          2.326 m Patient Age:    75 years           BP:           126/68 mmHg Patient Gender: M                  HR:           69 bpm. Exam Location:  Inpatient Procedure: 2D Echo, 3D Echo, Cardiac Doppler, Color Doppler and Intracardiac             Opacification Agent Indications:    R07.9* Chest pain, unspecified; 122-I22.9 Subsequent ST                 elevation (STEM) and non-ST elevation (NSTEMI) myocardial                 infarction  History:        Patient has no prior history of Echocardiogram examinations.                 Acute MI and CAD; Signs/Symptoms:Chest Pain and Hypotension.  Sonographer:    Roseanna Rainbow RDCS Referring Phys: [id number] [facility]  Sonographer Comments: Technically difficult study due to poor echo windows. Image acquisition challenging due to patient body habitus. IMPRESSIONS  1. Left ventricular ejection fraction, by estimation, is 40 to 45%. The left ventricle has mild to moderately decreased function. The left ventricle has no regional wall motion abnormalities. Left ventricular diastolic parameters are consistent with Grade I diastolic dysfunction (impaired relaxation).  2. Right ventricular systolic function is normal. The right ventricular size is normal.  3. The mitral valve is normal in structure. Mild to moderate mitral valve regurgitation. No evidence of mitral stenosis.  4. The aortic valve is tricuspid. Aortic valve regurgitation is not visualized. Aortic valve sclerosis is present, with no evidence of aortic valve stenosis.  5. The inferior vena cava is normal in size with greater than 50% respiratory variability, suggesting right atrial pressure of 3 mmHg. FINDINGS  Left Ventricle: Left ventricular ejection fraction, by estimation, is 40 to 45%. The left ventricle has  mild to moderately decreased function. The left ventricle has no regional wall motion abnormalities. Definity contrast agent was given IV to delineate the left ventricular endocardial borders. The left ventricular internal cavity size was normal in size. There is borderline left ventricular hypertrophy. Left ventricular diastolic parameters are consistent with Grade I diastolic dysfunction (impaired relaxation). Right Ventricle: The right  ventricular size is normal. Right ventricular systolic function is normal. Left Atrium: Left atrial size was normal in size. Right Atrium: Right atrial size was normal in size. Pericardium: There is no evidence of pericardial effusion. Mitral Valve: The mitral valve is normal in structure. Mild mitral annular calcification. Mild to moderate mitral valve regurgitation. No evidence of mitral valve stenosis. Tricuspid Valve: The tricuspid valve is normal in structure. Tricuspid valve regurgitation is trivial. No evidence of tricuspid stenosis. Aortic Valve: The aortic valve is tricuspid. Aortic valve regurgitation is not visualized. Aortic valve sclerosis is present, with no evidence of aortic valve stenosis. Pulmonic Valve: The pulmonic valve was normal in structure. Pulmonic valve regurgitation is trivial. No evidence of pulmonic stenosis. Aorta: The aortic root is normal in size and structure. Venous: The inferior vena cava is normal in size with greater than 50% respiratory variability, suggesting right atrial pressure of 3 mmHg. IAS/Shunts: No atrial level shunt detected by color flow Doppler.  LEFT VENTRICLE PLAX 2D LVIDd:         5.30 cm      Diastology LVIDs:         4.50 cm      LV e' medial:    5.87 cm/s LV PW:         1.20 cm      LV E/e' medial:  11.3 LV IVS:        1.10 cm      LV e' lateral:   8.16 cm/s LVOT diam:     2.40 cm      LV E/e' lateral: 8.2 LV SV:         95 LV SV Index:   41 LVOT Area:     4.52 cm                              3D Volume EF: LV Volumes (MOD)            3D EF:        40 % LV vol d, MOD A2C: 139.0 ml LV EDV:       147 ml LV vol d, MOD A4C: 176.0 ml LV ESV:       88 ml LV vol s, MOD A2C: 71.0 ml  LV SV:        59 ml LV vol s, MOD A4C: 55.0 ml LV SV MOD A2C:     68.0 ml LV SV MOD A4C:     176.0 ml LV SV MOD BP:      98.3 ml RIGHT VENTRICLE             IVC RV S prime:     11.70 cm/s  IVC diam: 2.00 cm TAPSE (M-mode): 2.0 cm LEFT ATRIUM             Index        RIGHT ATRIUM           Index LA diam:        3.60 cm 1.55 cm/m   RA Area:     9.15 cm LA  Vol [facility]):   54.7 ml 23.51 ml/m  RA Volume:   18.30 ml 7.87 ml/m LA Vol (A4C):   22.6 ml 9.72 ml/m LA Biplane Vol: 36.3 ml 15.60 ml/m  AORTIC VALVE             PULMONIC VALVE LVOT Vmax:   94.90 cm/s  PR End Diast Vel: 1.25 msec LVOT Vmean:  65.000 cm/s LVOT VTI:    0.209 m  AORTA Ao Root diam: 3.60 cm Ao Asc diam:  3.40 cm MITRAL VALVE MV Area (PHT): 3.48 cm       SHUNTS MV Decel Time: 218 msec       Systemic VTI:  0.21 m MR Peak grad:    103.6 mmHg   Systemic Diam: 2.40 cm MR Mean grad:    64.0 mmHg MR Vmax:         509.00 cm/s MR Vmean:        376.0 cm/s MR PISA:         1.57 cm MR PISA Eff ROA: 12 mm MR PISA Radius:  0.50 cm MV E velocity: 66.60 cm/s MV A velocity: 96.90 cm/s MV E/A ratio:  0.69 Kirk Ruths MD Electronically signed by Kirk Ruths MD Signature Date/Time: 11/19/2020/2:13:10 PM    Final     Cardiac Studies   Echo pending  Patient Profile     75 y.o. male with CAD, HTN, HLD, elevated blood sugar and CKD stage 3 admitted with chest pain, troponin elevation c/w a NSTEMI.   Assessment & Plan    1.  CAD/Non-STEMI: Admitted with chest pain concerning for angina. Mild troponin elevation. Plans for cardiac cath today. I have reviewed the risks, indications, and alternatives to cardiac catheterization, possible angioplasty, and stenting with the patient. Risks include but are not limited to bleeding, infection, vascular injury, stroke, myocardial infection, arrhythmia, kidney injury, radiation-related injury in the case of prolonged fluoroscopy use, emergency cardiac surgery, and death. The patient understands the risks of serious complication is 1-2 in 5364 with diagnostic cardiac cath and 1-2% or less with angioplasty/stenting.  -Continue ASA, statin, beta blocker and IV heparin -Remove NTG past pre cath  2.  Hypertension: BP elevated. Will titrate medical therapy post cath  3.  Hyperlipidemia: Continue  statin  4.  Prediabetes: A1c of 6.8.  Will need outpatient evaluation.  5.  CKD stage III: Creatinine has remained stable.   For questions or updates, please contact [place] Please consult [email or web address] for contact info under        Signed, Lauree Chandler, MD  11/21/2020, 9:58 AM

## 2020-11-21 NOTE — Progress Notes (Signed)
TCTS consulted for CABG evaluation. °

## 2020-11-21 NOTE — Interval H&P Note (Signed)
History and Physical Interval Note:  11/21/2020 12:58 PM  Corey Woods  has presented today for surgery, with the diagnosis of nstemi.  The various methods of treatment have been discussed with the patient and family. After consideration of risks, benefits and other options for treatment, the patient has consented to  Procedure(s): LEFT HEART CATH AND CORONARY ANGIOGRAPHY (N/A) as a surgical intervention.  The patient's history has been reviewed, patient examined, no change in status, stable for surgery.  I have reviewed the patient's chart and labs.  Questions were answered to the patient's satisfaction.   Cath Lab Visit (complete for each Cath Lab visit)  Clinical Evaluation Leading to the Procedure:   ACS: Yes.    Non-ACS:    Anginal Classification: CCS IV  Anti-ischemic medical therapy: No Therapy  Non-Invasive Test Results: No non-invasive testing performed  Prior CABG: Previous CABG        Collier Salina [facility] 11/21/2020 12:58 PM

## 2020-11-22 ENCOUNTER — Inpatient Hospital Stay (HOSPITAL_COMMUNITY): Payer: Medicare Other

## 2020-11-22 ENCOUNTER — Encounter (HOSPITAL_COMMUNITY): Payer: Self-pay | Admitting: Cardiology

## 2020-11-22 DIAGNOSIS — I214 Non-ST elevation (NSTEMI) myocardial infarction: Secondary | ICD-10-CM | POA: Diagnosis not present

## 2020-11-22 DIAGNOSIS — Z01810 Encounter for preprocedural cardiovascular examination: Secondary | ICD-10-CM | POA: Diagnosis not present

## 2020-11-22 LAB — APTT: aPTT: 69 seconds — ABNORMAL HIGH (ref 24–36)

## 2020-11-22 LAB — URINALYSIS, ROUTINE W REFLEX MICROSCOPIC
Bacteria, UA: NONE SEEN
Bilirubin Urine: NEGATIVE
Glucose, UA: NEGATIVE mg/dL
Ketones, ur: 5 mg/dL — AB
Leukocytes,Ua: NEGATIVE
Nitrite: NEGATIVE
Protein, ur: NEGATIVE mg/dL
Specific Gravity, Urine: 1.018 (ref 1.005–1.030)
pH: 6 (ref 5.0–8.0)

## 2020-11-22 LAB — BLOOD GAS, ARTERIAL
Acid-base deficit: 1.1 mmol/L (ref 0.0–2.0)
Bicarbonate: 22.8 mmol/L (ref 20.0–28.0)
Drawn by: 28099
FIO2: 21
O2 Saturation: 99 %
Patient temperature: 37
pCO2 arterial: 36 mmHg (ref 32.0–48.0)
pH, Arterial: 7.417 (ref 7.350–7.450)
pO2, Arterial: 144 mmHg — ABNORMAL HIGH (ref 83.0–108.0)

## 2020-11-22 LAB — CBC
HCT: 47.8 % (ref 39.0–52.0)
Hemoglobin: 16.4 g/dL (ref 13.0–17.0)
MCH: 30.7 pg (ref 26.0–34.0)
MCHC: 34.3 g/dL (ref 30.0–36.0)
MCV: 89.5 fL (ref 80.0–100.0)
Platelets: 193 10*3/uL (ref 150–400)
RBC: 5.34 MIL/uL (ref 4.22–5.81)
RDW: 12.9 % (ref 11.5–15.5)
WBC: 7 10*3/uL (ref 4.0–10.5)
nRBC: 0 % (ref 0.0–0.2)

## 2020-11-22 LAB — SURGICAL PCR SCREEN
MRSA, PCR: NEGATIVE
Staphylococcus aureus: NEGATIVE

## 2020-11-22 LAB — PROTIME-INR
INR: 1 (ref 0.8–1.2)
Prothrombin Time: 13.5 seconds (ref 11.4–15.2)

## 2020-11-22 LAB — PREPARE RBC (CROSSMATCH)

## 2020-11-22 LAB — ABO/RH: ABO/RH(D): A POS

## 2020-11-22 LAB — GLUCOSE, CAPILLARY
Glucose-Capillary: 156 mg/dL — ABNORMAL HIGH (ref 70–99)
Glucose-Capillary: 105 mg/dL — ABNORMAL HIGH (ref 70–99)
Glucose-Capillary: 129 mg/dL — ABNORMAL HIGH (ref 70–99)
Glucose-Capillary: 123 mg/dL — ABNORMAL HIGH (ref 70–99)

## 2020-11-22 LAB — HEPARIN LEVEL (UNFRACTIONATED): Heparin Unfractionated: 0.39 IU/mL (ref 0.30–0.70)

## 2020-11-22 MED ORDER — NITROGLYCERIN IN D5W 200-5 MCG/ML-% IV SOLN
2.0000 ug/min | INTRAVENOUS | Status: DC
  Filled 2020-11-22: qty 250

## 2020-11-22 MED ORDER — CEFAZOLIN SODIUM-DEXTROSE 2-4 GM/100ML-% IV SOLN
2.0000 g | INTRAVENOUS | Status: AC
  Administered 2020-11-23 (×2): 2 g via INTRAVENOUS
  Filled 2020-11-22: qty 100

## 2020-11-22 MED ORDER — MANNITOL 20 % IV SOLN
INTRAVENOUS | Status: DC
  Filled 2020-11-22: qty 13

## 2020-11-22 MED ORDER — HEPARIN 30,000 UNITS/1000 ML (OHS) CELLSAVER SOLUTION
Status: DC
  Filled 2020-11-22: qty 1000

## 2020-11-22 MED ORDER — DEXMEDETOMIDINE HCL IN NACL 400 MCG/100ML IV SOLN
0.1000 ug/kg/h | INTRAVENOUS | Status: AC
  Administered 2020-11-23: .3 ug/kg/h via INTRAVENOUS
  Filled 2020-11-22: qty 100

## 2020-11-22 MED ORDER — TEMAZEPAM 15 MG PO CAPS
15.0000 mg | ORAL_CAPSULE | Freq: Once | ORAL | Status: AC | PRN
  Administered 2020-11-22: 15 mg via ORAL
  Filled 2020-11-22: qty 1

## 2020-11-22 MED ORDER — CEFAZOLIN SODIUM-DEXTROSE 2-4 GM/100ML-% IV SOLN
2.0000 g | INTRAVENOUS | Status: DC
  Filled 2020-11-22: qty 100

## 2020-11-22 MED ORDER — MILRINONE LACTATE IN DEXTROSE 20-5 MG/100ML-% IV SOLN
0.3000 ug/kg/min | INTRAVENOUS | Status: DC
  Filled 2020-11-22: qty 100

## 2020-11-22 MED ORDER — BISACODYL 5 MG PO TBEC
5.0000 mg | DELAYED_RELEASE_TABLET | Freq: Once | ORAL | Status: AC
  Administered 2020-11-22: 5 mg via ORAL

## 2020-11-22 MED ORDER — METOPROLOL TARTRATE 12.5 MG HALF TABLET
12.5000 mg | ORAL_TABLET | Freq: Once | ORAL | Status: AC
  Administered 2020-11-23: 12.5 mg via ORAL
  Filled 2020-11-22: qty 1

## 2020-11-22 MED ORDER — LIVING WELL WITH DIABETES BOOK
Freq: Once | Status: DC
  Filled 2020-11-22: qty 1

## 2020-11-22 MED ORDER — TRANEXAMIC ACID (OHS) PUMP PRIME SOLUTION
2.0000 mg/kg | INTRAVENOUS | Status: DC
  Filled 2020-11-22: qty 2.26

## 2020-11-22 MED ORDER — TRANEXAMIC ACID 1000 MG/10ML IV SOLN
1.5000 mg/kg/h | INTRAVENOUS | Status: AC
  Administered 2020-11-23: 1.5 mg/kg/h via INTRAVENOUS
  Filled 2020-11-22 (×2): qty 25

## 2020-11-22 MED ORDER — MUPIROCIN 2 % EX OINT: 1.0000 "application " | TOPICAL_OINTMENT | Freq: Two times a day (BID) | CUTANEOUS | Status: DC

## 2020-11-22 MED ORDER — INSULIN REGULAR(HUMAN) IN NACL 100-0.9 UT/100ML-% IV SOLN
INTRAVENOUS | Status: AC
  Administered 2020-11-23: 1.4 [IU]/h via INTRAVENOUS
  Filled 2020-11-22: qty 100

## 2020-11-22 MED ORDER — VANCOMYCIN HCL 1500 MG/300ML IV SOLN
1500.0000 mg | INTRAVENOUS | Status: AC
  Administered 2020-11-23: 1500 mg via INTRAVENOUS
  Filled 2020-11-22: qty 300

## 2020-11-22 MED ORDER — PLASMA-LYTE A IV SOLN
INTRAVENOUS | Status: DC
  Filled 2020-11-22: qty 5

## 2020-11-22 MED ORDER — NOREPINEPHRINE 4 MG/250ML-% IV SOLN
0.0000 ug/min | INTRAVENOUS | Status: AC
  Administered 2020-11-23: 2 ug/min via INTRAVENOUS
  Filled 2020-11-22: qty 250

## 2020-11-22 MED ORDER — EPINEPHRINE HCL 5 MG/250ML IV SOLN IN NS
0.0000 ug/min | INTRAVENOUS | Status: DC
  Filled 2020-11-22: qty 250

## 2020-11-22 MED ORDER — METOPROLOL TARTRATE 25 MG PO TABS
25.0000 mg | ORAL_TABLET | Freq: Two times a day (BID) | ORAL | Status: DC
  Administered 2020-11-22: 25 mg via ORAL
  Filled 2020-11-22: qty 1

## 2020-11-22 MED ORDER — PHENYLEPHRINE HCL-NACL 20-0.9 MG/250ML-% IV SOLN
30.0000 ug/min | INTRAVENOUS | Status: AC
Start: 2020-11-23 — End: 2020-11-23
  Administered 2020-11-23: 30 ug/min via INTRAVENOUS
  Filled 2020-11-22: qty 250

## 2020-11-22 MED ORDER — TRANEXAMIC ACID (OHS) BOLUS VIA INFUSION
15.0000 mg/kg | INTRAVENOUS | Status: AC
  Administered 2020-11-23: 1696.5 mg via INTRAVENOUS
  Filled 2020-11-22: qty 1697

## 2020-11-22 MED ORDER — POTASSIUM CHLORIDE 2 MEQ/ML IV SOLN
80.0000 meq | INTRAVENOUS | Status: DC
  Filled 2020-11-22: qty 40

## 2020-11-22 NOTE — Final Progress Note (Signed)
ANTICOAGULATION CONSULT NOTE - Follow Up Consult  Pharmacy Consult for Heparin Indication: chest pain/ACS  Allergies  Allergen Reactions   Ciprofloxacin     Patient Measurements: Height: 5\' 10"  (177.8 cm) Weight: 113.1 kg (249 lb 6.4 oz) IBW/kg (Calculated) : 73 Heparin Dosing Weight: 99 kg  Vital Signs: Temp: 97.7 F (36.5 C) (11/22 0440) Temp Source: Oral (11/22 0440) BP: 150/87 (11/22 0440) Pulse Rate: 66 (11/22 0440)  Labs: Recent Labs    11/20/20 0151 11/21/20 0136 11/22/20 0547  HGB 13.7 14.0 16.4  HCT 40.1 41.5 47.8  PLT 179 184 193  APTT  --   --  69*  LABPROT  --   --  13.5  INR  --   --  1.0  HEPARINUNFRC 0.38 0.43 0.39  CREATININE  --  1.53*  --     Estimated Creatinine Clearance: 54.1 mL/min (A) (by C-G formula based on SCr of 1.53 mg/dL (H)).   Medications:  Scheduled:   aspirin EC  81 mg Oral Daily   atorvastatin  80 mg Oral q1800   buPROPion  300 mg Oral Daily   cariprazine  1.5 mg Oral Daily   DULoxetine  60 mg Oral BID   insulin aspart  0-9 Units Subcutaneous TID WC   metoprolol tartrate  12.5 mg Oral BID   montelukast  10 mg Oral QHS   sodium chloride flush  3 mL Intravenous Q12H   sodium chloride flush  3 mL Intravenous Q12H   tamsulosin  0.4 mg Oral Daily   Infusions:   sodium chloride Stopped (11/21/20 1358)   sodium chloride     heparin 1,750 Units/hr (11/22/20 4503)    Assessment: 75 yo M presenting with chest pain. Patient was not on anticoagulation PTA. Pharmacy consulted for heparin dosing   S/p cath 11/21 showing 3-vessel CAD. CT surgery consulted for CABG evaluation.   Heparin level this morning is therapeutic at 0.39, on 1750 units/hr. CBC stable. No line issues or signs/symptoms of bleeding noted per RN.   Goal of Therapy:  Heparin level 0.3-0.7 units/ml Monitor platelets by anticoagulation protocol: Yes   Plan:  Continue heparin at 1750 units/hr Daily CBC, heparin level Monitor for signs/symptoms of bleeding     Vance Peper, PharmD PGY1 Pharmacy Resident Phone [phone] 11/22/2020 7:31 AM   Please check AMION for all [place] phone numbers After 10:00 PM, call [place] [phone]

## 2020-11-22 NOTE — Progress Notes (Addendum)
After bedside shift report found pt using personal electric razor to remove hair all over body. Reiterated to pt that was not necessary that staff would complete prep. Pt was informed during shift report in detail about prep for surgery in AM, and that would be performed by staff. Pt verbalized understanding at that time.

## 2020-11-22 NOTE — Progress Notes (Signed)
Discussed IS (2200 ml), sternal precautions, mobility post op, and d/c planning. Pt voiced understanding. He has read the [facility] book and will watch video later (not currently working). His wife will be with him at d/c. [id number] Yves Dill CES, ACSM 12:25 PM 11/22/2020

## 2020-11-22 NOTE — Progress Notes (Addendum)
Progress Note  Patient Name: Corey Woods Date of Encounter: 11/22/2020  [facility] Cardiologist: Rozann Lesches, MD   Subjective   No chest pain.   Inpatient Medications    Scheduled Meds:  aspirin EC  81 mg Oral Daily   atorvastatin  80 mg Oral q1800   buPROPion  300 mg Oral Daily   cariprazine  1.5 mg Oral Daily   DULoxetine  60 mg Oral BID   insulin aspart  0-9 Units Subcutaneous TID WC   metoprolol tartrate  12.5 mg Oral BID   montelukast  10 mg Oral QHS   sodium chloride flush  3 mL Intravenous Q12H   sodium chloride flush  3 mL Intravenous Q12H   tamsulosin  0.4 mg Oral Daily   Continuous Infusions:  sodium chloride Stopped (11/21/20 1358)   sodium chloride     heparin 1,750 Units/hr (11/22/20 0649)   PRN Meds: sodium chloride, acetaminophen, albuterol, magnesium hydroxide, melatonin, nitroGLYCERIN, ondansetron (ZOFRAN) IV, pneumococcal 23 valent vaccine, sodium chloride flush   Vital Signs    Vitals:   11/21/20 1630 11/21/20 1730 11/21/20 2051 11/22/20 0440  BP: (!) 147/73 (!) 147/75 (!) 157/83 (!) 150/87  Pulse: 78 (!) 20 70 66  Resp: (!) 22 (!) 21 (!) 23 17  Temp:   97.9 F (36.6 C) 97.7 F (36.5 C)  TempSrc:   Oral Oral  SpO2: 97% 98% 94% 97%  Weight:    113.1 kg  Height:        Intake/Output Summary (Last 24 hours) at 11/22/2020 0835 Last data filed at 11/22/2020 0649 Gross per 24 hour  Intake 1199.77 ml  Output 1625 ml  Net -425.23 ml   Last 3 Weights 11/22/2020 11/21/2020 11/20/2020  Weight (lbs) 249 lb 6.4 oz 252 lb 255 lb 8.2 oz  Weight (kg) 113.127 kg 114.306 kg 115.9 kg      Telemetry    SR - Personally Reviewed  ECG    SR, 63 bpm - Personally Reviewed  Physical Exam   GEN: No acute distress.   Neck: No JVD Cardiac: RRR, no murmurs, rubs, or gallops.  Respiratory: Clear to auscultation bilaterally. GI: Soft, nontender, non-distended  MS: No edema; No deformity. Right radial cath site stable.  Neuro:   Nonfocal  Psych: Normal affect   Labs    High Sensitivity Troponin:   Recent Labs  Lab 11/18/20 1149 11/18/20 1319  TROPONINIHS 44* 180*     Chemistry Recent Labs  Lab 11/18/20 1149 11/18/20 1319 11/19/20 0021 11/21/20 0136  NA 134*  --  135 136  K 4.1  --  3.6 4.2  CL 100  --  99 99  CO2 25  --  27 29  GLUCOSE 275*  --  218* 151*  BUN 27*  --  20 24*  CREATININE 1.37*  --  1.48* 1.53*  CALCIUM 9.0  --  9.3 9.3  MG  --  2.0  --   --   PROT  --  6.3*  --   --   ALBUMIN  --  3.6  --   --   AST  --  24  --   --   ALT  --  31  --   --   ALKPHOS  --  59  --   --   BILITOT  --  1.1  --   --   GFRNONAA 55*  --  50* 48*  ANIONGAP 9  --  9 8  Lipids  Recent Labs  Lab 11/19/20 0021  CHOL 244*  TRIG 198*  HDL 59  LDLCALC 145*  CHOLHDL 4.1    Hematology Recent Labs  Lab 11/20/20 0151 11/21/20 0136 11/22/20 0547  WBC 8.1 7.1 7.0  RBC 4.48 4.63 5.34  HGB 13.7 14.0 16.4  HCT 40.1 41.5 47.8  MCV 89.5 89.6 89.5  MCH 30.6 30.2 30.7  MCHC 34.2 33.7 34.3  RDW 12.6 12.7 12.9  PLT 179 184 193   Thyroid No results for input(s): TSH, FREET4 in the last 168 hours.  BNPNo results for input(s): BNP, PROBNP in the last 168 hours.  DDimer No results for input(s): DDIMER in the last 168 hours.   Radiology    DG Chest 2 View  Result Date: 11/22/2020 CLINICAL DATA:  Preoperative study for CABG. EXAM: CHEST - 2 VIEW COMPARISON:  Chest x-ray 11/15/2020. FINDINGS: Mediastinum hilar structures normal. Heart size normal. Low lung volumes. Mild left base pleural-parenchymal thickening consistent scarring again noted. No acute infiltrate. No pleural effusion or pneumothorax. Degenerative changes thoracic spine. IMPRESSION: Low lung volumes. Mild left base pleural-parenchymal thickening consistent with scarring again noted. No acute cardiopulmonary disease identified. Electronically Signed   By: Marcello Moores  Register M.D.   On: 11/22/2020 07:49   CARDIAC CATHETERIZATION  Result Date:  11/21/2020   Prox LAD to Mid LAD lesion is 95% stenosed.   Mid LAD lesion is 50% stenosed.   Dist LAD lesion is 80% stenosed.   1st Diag lesion is 90% stenosed.   Mid Cx to Dist Cx lesion is 80% stenosed.   2nd Mrg lesion is 75% stenosed.   3rd Mrg lesion is 75% stenosed.   Prox RCA to Mid RCA lesion is 90% stenosed.   Mid RCA to Dist RCA lesion is 100% stenosed.   LV end diastolic pressure is normal. Severe 3 vessel obstructive CAD Normal LVEDP Plan: CT surgery consult for CABG    Cardiac Studies   Cath: 11/21/20    Prox LAD to Mid LAD lesion is 95% stenosed.   Mid LAD lesion is 50% stenosed.   Dist LAD lesion is 80% stenosed.   1st Diag lesion is 90% stenosed.   Mid Cx to Dist Cx lesion is 80% stenosed.   2nd Mrg lesion is 75% stenosed.   3rd Mrg lesion is 75% stenosed.   Prox RCA to Mid RCA lesion is 90% stenosed.   Mid RCA to Dist RCA lesion is 100% stenosed.   LV end diastolic pressure is normal.   Severe 3 vessel obstructive CAD Normal LVEDP   Plan: CT surgery consult for CABG  Diagnostic Dominance: Right  Echo: 11/19/20  IMPRESSIONS     1. Left ventricular ejection fraction, by estimation, is 40 to 45%. The  left ventricle has mild to moderately decreased function. The left  ventricle has no regional wall motion abnormalities. Left ventricular  diastolic parameters are consistent with  Grade I diastolic dysfunction (impaired relaxation).   2. Right ventricular systolic function is normal. The right ventricular  size is normal.   3. The mitral valve is normal in structure. Mild to moderate mitral valve  regurgitation. No evidence of mitral stenosis.   4. The aortic valve is tricuspid. Aortic valve regurgitation is not  visualized. Aortic valve sclerosis is present, with no evidence of aortic  valve stenosis.   5. The inferior vena cava is normal in size with greater than 50%  respiratory variability, suggesting right atrial pressure of 3 mmHg.  FINDINGS   Left  Ventricle: Left ventricular ejection fraction, by estimation, is 40  to 45%. The left ventricle has mild to moderately decreased function. The  left ventricle has no regional wall motion abnormalities. Definity  contrast agent was given IV to  delineate the left ventricular endocardial borders. The left ventricular  internal cavity size was normal in size. There is borderline left  ventricular hypertrophy. Left ventricular diastolic parameters are  consistent with Grade I diastolic dysfunction  (impaired relaxation).   Right Ventricle: The right ventricular size is normal. Right ventricular  systolic function is normal.   Left Atrium: Left atrial size was normal in size.   Right Atrium: Right atrial size was normal in size.   Pericardium: There is no evidence of pericardial effusion.   Mitral Valve: The mitral valve is normal in structure. Mild mitral annular  calcification. Mild to moderate mitral valve regurgitation. No evidence of  mitral valve stenosis.   Tricuspid Valve: The tricuspid valve is normal in structure. Tricuspid  valve regurgitation is trivial. No evidence of tricuspid stenosis.   Aortic Valve: The aortic valve is tricuspid. Aortic valve regurgitation is  not visualized. Aortic valve sclerosis is present, with no evidence of  aortic valve stenosis.   Pulmonic Valve: The pulmonic valve was normal in structure. Pulmonic valve  regurgitation is trivial. No evidence of pulmonic stenosis.   Aorta: The aortic root is normal in size and structure.   Venous: The inferior vena cava is normal in size with greater than 50%  respiratory variability, suggesting right atrial pressure of 3 mmHg.   IAS/Shunts: No atrial level shunt detected by color flow Doppler.   Patient Profile     75 y.o. male  with CAD, HTN, HLD, elevated blood sugar and CKD stage 3 admitted with chest pain, troponin elevation c/w a NSTEMI.    Assessment & Plan    NSTEMI: hsTn 44>>180.  Underwent  cardiac catheterization noted above with severe three-vessel CAD.  TCTS consulted with plans for CABG potentially tomorrow.  No recurrent chest pain. --Continue IV heparin, aspirin, atorvastatin 80 mg daily, metoprolol 12.5 mg twice daily --Undergoing preoperative evaluation with PFTs and Dopplers.  HFrEF: Echo showed LVEF of 40 to 67%, grade 1 diastolic dysfunction, normal RV, no valvular disease.  No signs of volume overload on exam --On metoprolol 12.5 mg twice daily.  Creatinine elevated at 1.5 therefore will defer addition of ACE or ARB at this time.  And to further optimize GDMT post surgery  HTN: Borderline controlled --We will further increase metoprolol 12.5 mg twice daily to 25 mg BID  HLD: LDL 145 HDL 59 --Now on atorvastatin 80 mg daily  DM: Hemoglobin A1c 6.8 --SSI while inpatient --consider SGTL2 prior to discharge  CKD stage III: Cr 1.3>>1.48>>1.53 -- received post cath hydration -- avoiding ACE/ARB  For questions or updates, please contact [facility] Please consult [email or web address] for contact info under        Signed, Reino Bellis, NP  11/22/2020, 8:35 AM    Patient seen, examined. Available data reviewed. Agree with findings, assessment, and plan as outlined by Reino Bellis, NP.  The patient is independently interviewed and examined.  He is alert, oriented, in no distress.  Lungs are clear to auscultation bilaterally, heart is regular rate and rhythm no murmur gallop, abdomen is soft and nontender, extremities have no edema.  The patient is awaiting multivessel CABG for treatment of severe multivessel coronary artery disease after presenting with non-STEMI.  He is  chest pain-free on IV heparin, aspirin, high intensity statin drug, and beta-blocker.  Agree on holding initiation of an ACE/ARB with creatinine 1.5 heading into bypass surgery.  We will optimize medical therapy postoperatively.  Sherren Mocha, M.D. 11/22/2020 9:08 AM

## 2020-11-22 NOTE — Progress Notes (Signed)
Inpatient Diabetes Program Recommendations  AACE/ADA: New Consensus Statement on Inpatient Glycemic Control (2015)  Target Ranges:  Prepandial:   less than 140 mg/dL      Peak postprandial:   less than 180 mg/dL (1-2 hours)      Critically ill patients:  140 - 180 mg/dL   Lab Results  Component Value Date   GLUCAP 105 (H) 11/22/2020   HGBA1C 6.8 (H) 11/19/2020    Review of Glycemic Control  Diabetes history: New diagnosis Outpatient Diabetes medications: None Current orders for Inpatient glycemic control:  Novolog 0-9 units tid  A1c 6.8% on 11/19  Agree with SGLT-2 at d/c IV insulin to surgery  Will see pt closer to d/c to discuss lifestyle modifications.  Thanks,  Tama Headings RN, MSN, BC-ADM Inpatient Diabetes Coordinator Team Pager [phone] (8a-5p)

## 2020-11-22 NOTE — Care Management Important Message (Signed)
Important Message  Patient Details  Name: Corey Woods MRN: [id number] Date of Birth: [birth year]   Medicare Important Message Given:  Yes     Shelda Altes 11/22/2020, 8:36 AM

## 2020-11-22 NOTE — Progress Notes (Signed)
Pre-CABG testing has been completed. Preliminary results can be found in CV Proc through chart review.   11/22/20 10:46 AM Corey Woods RVT

## 2020-11-23 ENCOUNTER — Inpatient Hospital Stay (HOSPITAL_COMMUNITY): Payer: Medicare Other | Admitting: Anesthesiology

## 2020-11-23 ENCOUNTER — Encounter (HOSPITAL_COMMUNITY): Payer: Self-pay | Admitting: Cardiology

## 2020-11-23 ENCOUNTER — Inpatient Hospital Stay (HOSPITAL_COMMUNITY)
Admission: EM | Disposition: A | Discharge status: Home / Self Care | Payer: Self-pay | Source: Home / Self Care | Attending: Thoracic Surgery (Cardiothoracic Vascular Surgery)

## 2020-11-23 ENCOUNTER — Inpatient Hospital Stay (HOSPITAL_COMMUNITY): Payer: Medicare Other

## 2020-11-23 DIAGNOSIS — I2510 Atherosclerotic heart disease of native coronary artery without angina pectoris: Secondary | ICD-10-CM | POA: Diagnosis not present

## 2020-11-23 DIAGNOSIS — E1159 Type 2 diabetes mellitus with other circulatory complications: Secondary | ICD-10-CM

## 2020-11-23 DIAGNOSIS — Z9911 Dependence on respirator [ventilator] status: Secondary | ICD-10-CM | POA: Diagnosis not present

## 2020-11-23 DIAGNOSIS — J95821 Acute postprocedural respiratory failure: Secondary | ICD-10-CM

## 2020-11-23 DIAGNOSIS — I152 Hypertension secondary to endocrine disorders: Secondary | ICD-10-CM

## 2020-11-23 DIAGNOSIS — I214 Non-ST elevation (NSTEMI) myocardial infarction: Secondary | ICD-10-CM | POA: Diagnosis not present

## 2020-11-23 DIAGNOSIS — I252 Old myocardial infarction: Secondary | ICD-10-CM

## 2020-11-23 DIAGNOSIS — E785 Hyperlipidemia, unspecified: Secondary | ICD-10-CM

## 2020-11-23 DIAGNOSIS — I5021 Acute systolic (congestive) heart failure: Secondary | ICD-10-CM

## 2020-11-23 DIAGNOSIS — R7303 Prediabetes: Secondary | ICD-10-CM

## 2020-11-23 DIAGNOSIS — Z951 Presence of aortocoronary bypass graft: Secondary | ICD-10-CM

## 2020-11-23 DIAGNOSIS — I270 Primary pulmonary hypertension: Secondary | ICD-10-CM

## 2020-11-23 HISTORY — PX: CORONARY ARTERY BYPASS GRAFT: SHX141

## 2020-11-23 HISTORY — PX: ENDOVEIN HARVEST OF GREATER SAPHENOUS VEIN: SHX5059

## 2020-11-23 HISTORY — PX: TEE WITHOUT CARDIOVERSION: SHX5443

## 2020-11-23 LAB — POCT I-STAT, CHEM 8
BUN: 28 mg/dL — ABNORMAL HIGH (ref 8–23)
BUN: 27 mg/dL — ABNORMAL HIGH (ref 8–23)
BUN: 29 mg/dL — ABNORMAL HIGH (ref 8–23)
BUN: 29 mg/dL — ABNORMAL HIGH (ref 8–23)
BUN: 31 mg/dL — ABNORMAL HIGH (ref 8–23)
Calcium, Ion: 1.29 mmol/L (ref 1.15–1.40)
Calcium, Ion: 1.24 mmol/L (ref 1.15–1.40)
Calcium, Ion: 1.15 mmol/L (ref 1.15–1.40)
Calcium, Ion: 1.15 mmol/L (ref 1.15–1.40)
Calcium, Ion: 1.37 mmol/L (ref 1.15–1.40)
Chloride: 101 mmol/L (ref 98–111)
Chloride: 101 mmol/L (ref 98–111)
Chloride: 101 mmol/L (ref 98–111)
Chloride: 102 mmol/L (ref 98–111)
Chloride: 103 mmol/L (ref 98–111)
Creatinine, Ser: 1.6 mg/dL — ABNORMAL HIGH (ref 0.61–1.24)
Creatinine, Ser: 1.6 mg/dL — ABNORMAL HIGH (ref 0.61–1.24)
Creatinine, Ser: 1.5 mg/dL — ABNORMAL HIGH (ref 0.61–1.24)
Creatinine, Ser: 1.4 mg/dL — ABNORMAL HIGH (ref 0.61–1.24)
Creatinine, Ser: 1.6 mg/dL — ABNORMAL HIGH (ref 0.61–1.24)
Glucose, Bld: 145 mg/dL — ABNORMAL HIGH (ref 70–99)
Glucose, Bld: 147 mg/dL — ABNORMAL HIGH (ref 70–99)
Glucose, Bld: 137 mg/dL — ABNORMAL HIGH (ref 70–99)
Glucose, Bld: 161 mg/dL — ABNORMAL HIGH (ref 70–99)
Glucose, Bld: 164 mg/dL — ABNORMAL HIGH (ref 70–99)
HCT: 42 % (ref 39.0–52.0)
HCT: 38 % — ABNORMAL LOW (ref 39.0–52.0)
HCT: 34 % — ABNORMAL LOW (ref 39.0–52.0)
HCT: 31 % — ABNORMAL LOW (ref 39.0–52.0)
HCT: 33 % — ABNORMAL LOW (ref 39.0–52.0)
Hemoglobin: 14.3 g/dL (ref 13.0–17.0)
Hemoglobin: 12.9 g/dL — ABNORMAL LOW (ref 13.0–17.0)
Hemoglobin: 11.6 g/dL — ABNORMAL LOW (ref 13.0–17.0)
Hemoglobin: 10.5 g/dL — ABNORMAL LOW (ref 13.0–17.0)
Hemoglobin: 11.2 g/dL — ABNORMAL LOW (ref 13.0–17.0)
Potassium: 4.1 mmol/L (ref 3.5–5.1)
Potassium: 4.3 mmol/L (ref 3.5–5.1)
Potassium: 5 mmol/L (ref 3.5–5.1)
Potassium: 4.6 mmol/L (ref 3.5–5.1)
Potassium: 5.2 mmol/L — ABNORMAL HIGH (ref 3.5–5.1)
Sodium: 135 mmol/L (ref 135–145)
Sodium: 135 mmol/L (ref 135–145)
Sodium: 134 mmol/L — ABNORMAL LOW (ref 135–145)
Sodium: 134 mmol/L — ABNORMAL LOW (ref 135–145)
Sodium: 135 mmol/L (ref 135–145)
TCO2: 28 mmol/L (ref 22–32)
TCO2: 25 mmol/L (ref 22–32)
TCO2: 25 mmol/L (ref 22–32)
TCO2: 24 mmol/L (ref 22–32)
TCO2: 24 mmol/L (ref 22–32)

## 2020-11-23 LAB — POCT I-STAT EG7
Acid-Base Excess: 0 mmol/L (ref 0.0–2.0)
Bicarbonate: 26.4 mmol/L (ref 20.0–28.0)
Calcium, Ion: 1.13 mmol/L — ABNORMAL LOW (ref 1.15–1.40)
HCT: 32 % — ABNORMAL LOW (ref 39.0–52.0)
Hemoglobin: 10.9 g/dL — ABNORMAL LOW (ref 13.0–17.0)
O2 Saturation: 79 %
Potassium: 4.4 mmol/L (ref 3.5–5.1)
Sodium: 134 mmol/L — ABNORMAL LOW (ref 135–145)
TCO2: 28 mmol/L (ref 22–32)
pCO2, Ven: 52 mmHg (ref 44.0–60.0)
pH, Ven: 7.314 (ref 7.250–7.430)
pO2, Ven: 48 mmHg — ABNORMAL HIGH (ref 32.0–45.0)

## 2020-11-23 LAB — POCT I-STAT 7, (LYTES, BLD GAS, ICA,H+H)
Acid-Base Excess: 1 mmol/L (ref 0.0–2.0)
Acid-Base Excess: 0 mmol/L (ref 0.0–2.0)
Acid-Base Excess: 2 mmol/L (ref 0.0–2.0)
Acid-base deficit: 1 mmol/L (ref 0.0–2.0)
Acid-base deficit: 5 mmol/L — ABNORMAL HIGH (ref 0.0–2.0)
Acid-base deficit: 7 mmol/L — ABNORMAL HIGH (ref 0.0–2.0)
Acid-base deficit: 5 mmol/L — ABNORMAL HIGH (ref 0.0–2.0)
Acid-base deficit: 5 mmol/L — ABNORMAL HIGH (ref 0.0–2.0)
Bicarbonate: 27.4 mmol/L (ref 20.0–28.0)
Bicarbonate: 23.9 mmol/L (ref 20.0–28.0)
Bicarbonate: 26.8 mmol/L (ref 20.0–28.0)
Bicarbonate: 25.2 mmol/L (ref 20.0–28.0)
Bicarbonate: 20.1 mmol/L (ref 20.0–28.0)
Bicarbonate: 19.4 mmol/L — ABNORMAL LOW (ref 20.0–28.0)
Bicarbonate: 21.4 mmol/L (ref 20.0–28.0)
Bicarbonate: 21.1 mmol/L (ref 20.0–28.0)
Calcium, Ion: 1.11 mmol/L — ABNORMAL LOW (ref 1.15–1.40)
Calcium, Ion: 1.13 mmol/L — ABNORMAL LOW (ref 1.15–1.40)
Calcium, Ion: 1.14 mmol/L — ABNORMAL LOW (ref 1.15–1.40)
Calcium, Ion: 1.36 mmol/L (ref 1.15–1.40)
Calcium, Ion: 1.22 mmol/L (ref 1.15–1.40)
Calcium, Ion: 1.16 mmol/L (ref 1.15–1.40)
Calcium, Ion: 1.18 mmol/L (ref 1.15–1.40)
Calcium, Ion: 1.16 mmol/L (ref 1.15–1.40)
HCT: 31 % — ABNORMAL LOW (ref 39.0–52.0)
HCT: 32 % — ABNORMAL LOW (ref 39.0–52.0)
HCT: 33 % — ABNORMAL LOW (ref 39.0–52.0)
HCT: 32 % — ABNORMAL LOW (ref 39.0–52.0)
HCT: 33 % — ABNORMAL LOW (ref 39.0–52.0)
HCT: 31 % — ABNORMAL LOW (ref 39.0–52.0)
HCT: 33 % — ABNORMAL LOW (ref 39.0–52.0)
HCT: 32 % — ABNORMAL LOW (ref 39.0–52.0)
Hemoglobin: 10.5 g/dL — ABNORMAL LOW (ref 13.0–17.0)
Hemoglobin: 10.9 g/dL — ABNORMAL LOW (ref 13.0–17.0)
Hemoglobin: 11.2 g/dL — ABNORMAL LOW (ref 13.0–17.0)
Hemoglobin: 10.9 g/dL — ABNORMAL LOW (ref 13.0–17.0)
Hemoglobin: 11.2 g/dL — ABNORMAL LOW (ref 13.0–17.0)
Hemoglobin: 10.5 g/dL — ABNORMAL LOW (ref 13.0–17.0)
Hemoglobin: 11.2 g/dL — ABNORMAL LOW (ref 13.0–17.0)
Hemoglobin: 10.9 g/dL — ABNORMAL LOW (ref 13.0–17.0)
O2 Saturation: 100 %
O2 Saturation: 100 %
O2 Saturation: 100 %
O2 Saturation: 100 %
O2 Saturation: 93 %
O2 Saturation: 97 %
O2 Saturation: 96 %
O2 Saturation: 95 %
Patient temperature: 36.4
Patient temperature: 36.5
Patient temperature: 36.9
Patient temperature: 37.1
Potassium: 4.3 mmol/L (ref 3.5–5.1)
Potassium: 5 mmol/L (ref 3.5–5.1)
Potassium: 5.2 mmol/L — ABNORMAL HIGH (ref 3.5–5.1)
Potassium: 4.6 mmol/L (ref 3.5–5.1)
Potassium: 4.1 mmol/L (ref 3.5–5.1)
Potassium: 4.4 mmol/L (ref 3.5–5.1)
Potassium: 4.5 mmol/L (ref 3.5–5.1)
Potassium: 4.3 mmol/L (ref 3.5–5.1)
Sodium: 133 mmol/L — ABNORMAL LOW (ref 135–145)
Sodium: 133 mmol/L — ABNORMAL LOW (ref 135–145)
Sodium: 134 mmol/L — ABNORMAL LOW (ref 135–145)
Sodium: 135 mmol/L (ref 135–145)
Sodium: 138 mmol/L (ref 135–145)
Sodium: 139 mmol/L (ref 135–145)
Sodium: 139 mmol/L (ref 135–145)
Sodium: 138 mmol/L (ref 135–145)
TCO2: 29 mmol/L (ref 22–32)
TCO2: 25 mmol/L (ref 22–32)
TCO2: 28 mmol/L (ref 22–32)
TCO2: 27 mmol/L (ref 22–32)
TCO2: 21 mmol/L — ABNORMAL LOW (ref 22–32)
TCO2: 21 mmol/L — ABNORMAL LOW (ref 22–32)
TCO2: 23 mmol/L (ref 22–32)
TCO2: 22 mmol/L (ref 22–32)
pCO2 arterial: 49.7 mmHg — ABNORMAL HIGH (ref 32.0–48.0)
pCO2 arterial: 35.6 mmHg (ref 32.0–48.0)
pCO2 arterial: 43.3 mmHg (ref 32.0–48.0)
pCO2 arterial: 45 mmHg (ref 32.0–48.0)
pCO2 arterial: 37.5 mmHg (ref 32.0–48.0)
pCO2 arterial: 40.6 mmHg (ref 32.0–48.0)
pCO2 arterial: 41.7 mmHg (ref 32.0–48.0)
pCO2 arterial: 41.7 mmHg (ref 32.0–48.0)
pH, Arterial: 7.349 — ABNORMAL LOW (ref 7.350–7.450)
pH, Arterial: 7.399 (ref 7.350–7.450)
pH, Arterial: 7.434 (ref 7.350–7.450)
pH, Arterial: 7.356 (ref 7.350–7.450)
pH, Arterial: 7.334 — ABNORMAL LOW (ref 7.350–7.450)
pH, Arterial: 7.284 — ABNORMAL LOW (ref 7.350–7.450)
pH, Arterial: 7.318 — ABNORMAL LOW (ref 7.350–7.450)
pH, Arterial: 7.314 — ABNORMAL LOW (ref 7.350–7.450)
pO2, Arterial: 340 mmHg — ABNORMAL HIGH (ref 83.0–108.0)
pO2, Arterial: 175 mmHg — ABNORMAL HIGH (ref 83.0–108.0)
pO2, Arterial: 319 mmHg — ABNORMAL HIGH (ref 83.0–108.0)
pO2, Arterial: 230 mmHg — ABNORMAL HIGH (ref 83.0–108.0)
pO2, Arterial: 69 mmHg — ABNORMAL LOW (ref 83.0–108.0)
pO2, Arterial: 96 mmHg (ref 83.0–108.0)
pO2, Arterial: 87 mmHg (ref 83.0–108.0)
pO2, Arterial: 85 mmHg (ref 83.0–108.0)

## 2020-11-23 LAB — CBC
HCT: 45.1 % (ref 39.0–52.0)
HCT: 36 % — ABNORMAL LOW (ref 39.0–52.0)
HCT: 36 % — ABNORMAL LOW (ref 39.0–52.0)
Hemoglobin: 15.6 g/dL (ref 13.0–17.0)
Hemoglobin: 12.2 g/dL — ABNORMAL LOW (ref 13.0–17.0)
Hemoglobin: 12.6 g/dL — ABNORMAL LOW (ref 13.0–17.0)
MCH: 31 pg (ref 26.0–34.0)
MCH: 30.6 pg (ref 26.0–34.0)
MCH: 31.3 pg (ref 26.0–34.0)
MCHC: 34.6 g/dL (ref 30.0–36.0)
MCHC: 33.9 g/dL (ref 30.0–36.0)
MCHC: 35 g/dL (ref 30.0–36.0)
MCV: 89.7 fL (ref 80.0–100.0)
MCV: 90.2 fL (ref 80.0–100.0)
MCV: 89.3 fL (ref 80.0–100.0)
Platelets: 169 10*3/uL (ref 150–400)
Platelets: 133 10*3/uL — ABNORMAL LOW (ref 150–400)
Platelets: 158 10*3/uL (ref 150–400)
RBC: 5.03 MIL/uL (ref 4.22–5.81)
RBC: 3.99 MIL/uL — ABNORMAL LOW (ref 4.22–5.81)
RBC: 4.03 MIL/uL — ABNORMAL LOW (ref 4.22–5.81)
RDW: 13.1 % (ref 11.5–15.5)
RDW: 12.7 % (ref 11.5–15.5)
RDW: 12.8 % (ref 11.5–15.5)
WBC: 7.2 10*3/uL (ref 4.0–10.5)
WBC: 9.9 10*3/uL (ref 4.0–10.5)
WBC: 10.6 10*3/uL — ABNORMAL HIGH (ref 4.0–10.5)
nRBC: 0 % (ref 0.0–0.2)
nRBC: 0 % (ref 0.0–0.2)
nRBC: 0 % (ref 0.0–0.2)

## 2020-11-23 LAB — COMPREHENSIVE METABOLIC PANEL
ALT: 120 U/L — ABNORMAL HIGH (ref 0–44)
AST: 104 U/L — ABNORMAL HIGH (ref 15–41)
Albumin: 3.6 g/dL (ref 3.5–5.0)
Alkaline Phosphatase: 62 U/L (ref 38–126)
Anion gap: 6 (ref 5–15)
BUN: 25 mg/dL — ABNORMAL HIGH (ref 8–23)
CO2: 27 mmol/L (ref 22–32)
Calcium: 9.4 mg/dL (ref 8.9–10.3)
Chloride: 102 mmol/L (ref 98–111)
Creatinine, Ser: 1.64 mg/dL — ABNORMAL HIGH (ref 0.61–1.24)
GFR, Estimated: 44 mL/min — ABNORMAL LOW (ref >60)
Glucose, Bld: 115 mg/dL — ABNORMAL HIGH (ref 70–99)
Potassium: 4.1 mmol/L (ref 3.5–5.1)
Sodium: 135 mmol/L (ref 135–145)
Total Bilirubin: 1.5 mg/dL — ABNORMAL HIGH (ref 0.3–1.2)
Total Protein: 5.6 g/dL — ABNORMAL LOW (ref 6.5–8.1)

## 2020-11-23 LAB — HEMOGLOBIN AND HEMATOCRIT, BLOOD
HCT: 33.7 % — ABNORMAL LOW (ref 39.0–52.0)
Hemoglobin: 11.8 g/dL — ABNORMAL LOW (ref 13.0–17.0)

## 2020-11-23 LAB — BASIC METABOLIC PANEL
Anion gap: 7 (ref 5–15)
BUN: 29 mg/dL — ABNORMAL HIGH (ref 8–23)
CO2: 23 mmol/L (ref 22–32)
Calcium: 8.1 mg/dL — ABNORMAL LOW (ref 8.9–10.3)
Chloride: 104 mmol/L (ref 98–111)
Creatinine, Ser: 1.55 mg/dL — ABNORMAL HIGH (ref 0.61–1.24)
GFR, Estimated: 47 mL/min — ABNORMAL LOW (ref >60)
Glucose, Bld: 146 mg/dL — ABNORMAL HIGH (ref 70–99)
Potassium: 4.7 mmol/L (ref 3.5–5.1)
Sodium: 134 mmol/L — ABNORMAL LOW (ref 135–145)

## 2020-11-23 LAB — PROTIME-INR
INR: 1.3 — ABNORMAL HIGH (ref 0.8–1.2)
Prothrombin Time: 16.2 seconds — ABNORMAL HIGH (ref 11.4–15.2)

## 2020-11-23 LAB — ECHO INTRAOPERATIVE TEE
Area-P 1/2: 1.86 cm2
Height: 70 in
S' Lateral: 3.63 cm
Weight: 3990.4 oz

## 2020-11-23 LAB — GLUCOSE, CAPILLARY
Glucose-Capillary: 147 mg/dL — ABNORMAL HIGH (ref 70–99)
Glucose-Capillary: 157 mg/dL — ABNORMAL HIGH (ref 70–99)
Glucose-Capillary: 163 mg/dL — ABNORMAL HIGH (ref 70–99)
Glucose-Capillary: 118 mg/dL — ABNORMAL HIGH (ref 70–99)
Glucose-Capillary: 120 mg/dL — ABNORMAL HIGH (ref 70–99)
Glucose-Capillary: 128 mg/dL — ABNORMAL HIGH (ref 70–99)
Glucose-Capillary: 139 mg/dL — ABNORMAL HIGH (ref 70–99)
Glucose-Capillary: 116 mg/dL — ABNORMAL HIGH (ref 70–99)
Glucose-Capillary: 118 mg/dL — ABNORMAL HIGH (ref 70–99)

## 2020-11-23 LAB — HEPARIN LEVEL (UNFRACTIONATED): Heparin Unfractionated: 0.57 IU/mL (ref 0.30–0.70)

## 2020-11-23 LAB — APTT: aPTT: 28 seconds (ref 24–36)

## 2020-11-23 LAB — MAGNESIUM: Magnesium: 3.2 mg/dL — ABNORMAL HIGH (ref 1.7–2.4)

## 2020-11-23 LAB — PLATELET COUNT: Platelets: 168 10*3/uL (ref 150–400)

## 2020-11-23 SURGERY — CORONARY ARTERY BYPASS GRAFTING (CABG)
Anesthesia: General | Site: Leg Upper | Laterality: Right

## 2020-11-23 MED ORDER — 0.9 % SODIUM CHLORIDE (POUR BTL) OPTIME
TOPICAL | Status: DC | PRN
  Administered 2020-11-23: 1000 mL

## 2020-11-23 MED ORDER — ROCURONIUM BROMIDE 10 MG/ML (PF) SYRINGE
PREFILLED_SYRINGE | INTRAVENOUS | Status: DC | PRN
Start: 2020-11-23 — End: 2020-11-23
  Administered 2020-11-23: 40 mg via INTRAVENOUS
  Administered 2020-11-23: 20 mg via INTRAVENOUS
  Administered 2020-11-23: 40 mg via INTRAVENOUS
  Administered 2020-11-23: 100 mg via INTRAVENOUS

## 2020-11-23 MED ORDER — FAMOTIDINE IN NACL 20-0.9 MG/50ML-% IV SOLN
20.0000 mg | Freq: Two times a day (BID) | INTRAVENOUS | Status: AC
  Administered 2020-11-23: 20 mg via INTRAVENOUS
  Filled 2020-11-23: qty 50

## 2020-11-23 MED ORDER — LIDOCAINE 2% (20 MG/ML) 5 ML SYRINGE
INTRAMUSCULAR | Status: AC
  Filled 2020-11-23: qty 5

## 2020-11-23 MED ORDER — ALBUMIN HUMAN 5 % IV SOLN
250.0000 mL | INTRAVENOUS | Status: AC | PRN
  Administered 2020-11-23 (×4): 12.5 g via INTRAVENOUS
  Filled 2020-11-23 (×2): qty 250

## 2020-11-23 MED ORDER — MAGNESIUM SULFATE 4 GM/100ML IV SOLN
4.0000 g | Freq: Once | INTRAVENOUS | Status: AC
  Administered 2020-11-23: 4 g via INTRAVENOUS
  Filled 2020-11-23: qty 100

## 2020-11-23 MED ORDER — TRAMADOL HCL 50 MG PO TABS
50.0000 mg | ORAL_TABLET | ORAL | Status: DC | PRN
  Administered 2020-11-23 – 2020-11-25 (×4): 100 mg via ORAL
  Filled 2020-11-23 (×4): qty 2

## 2020-11-23 MED ORDER — NOREPINEPHRINE 4 MG/250ML-% IV SOLN: 0.0000 ug/min | INTRAVENOUS | Status: DC

## 2020-11-23 MED ORDER — ACETAMINOPHEN 160 MG/5ML PO SOLN: 1000.0000 mg | Freq: Four times a day (QID) | ORAL | Status: AC

## 2020-11-23 MED ORDER — CHLORHEXIDINE GLUCONATE CLOTH 2 % EX PADS
6.0000 | MEDICATED_PAD | Freq: Once | CUTANEOUS | Status: AC
  Administered 2020-11-22: 6 via TOPICAL

## 2020-11-23 MED ORDER — OXYCODONE HCL 5 MG PO TABS
5.0000 mg | ORAL_TABLET | ORAL | Status: DC | PRN
  Administered 2020-11-23 – 2020-11-25 (×7): 10 mg via ORAL
  Filled 2020-11-23 (×7): qty 2

## 2020-11-23 MED ORDER — ONDANSETRON HCL 4 MG/2ML IJ SOLN
INTRAMUSCULAR | Status: DC | PRN
  Administered 2020-11-23: 4 mg via INTRAVENOUS

## 2020-11-23 MED ORDER — VASOPRESSIN 20 UNIT/ML IV SOLN
INTRAVENOUS | Status: AC
  Filled 2020-11-23: qty 1

## 2020-11-23 MED ORDER — BISACODYL 5 MG PO TBEC
10.0000 mg | DELAYED_RELEASE_TABLET | Freq: Every day | ORAL | Status: DC
  Administered 2020-11-24 – 2020-11-28 (×5): 10 mg via ORAL
  Filled 2020-11-23 (×6): qty 2

## 2020-11-23 MED ORDER — LACTATED RINGERS IV SOLN: INTRAVENOUS | Status: DC

## 2020-11-23 MED ORDER — METOPROLOL TARTRATE 25 MG/10 ML ORAL SUSPENSION: 12.5000 mg | Freq: Two times a day (BID) | ORAL | Status: DC

## 2020-11-23 MED ORDER — NITROGLYCERIN IN D5W 200-5 MCG/ML-% IV SOLN
0.0000 ug/min | INTRAVENOUS | Status: DC
Start: 2020-11-23 — End: 2020-11-26

## 2020-11-23 MED ORDER — VANCOMYCIN HCL IN DEXTROSE 1-5 GM/200ML-% IV SOLN
1000.0000 mg | Freq: Once | INTRAVENOUS | Status: AC
  Administered 2020-11-23: 1000 mg via INTRAVENOUS
  Filled 2020-11-23: qty 200

## 2020-11-23 MED ORDER — ACETAMINOPHEN 650 MG RE SUPP
650.0000 mg | Freq: Once | RECTAL | Status: AC
  Administered 2020-11-23: 650 mg via RECTAL

## 2020-11-23 MED ORDER — DOCUSATE SODIUM 100 MG PO CAPS
200.0000 mg | ORAL_CAPSULE | Freq: Every day | ORAL | Status: DC
  Administered 2020-11-24 – 2020-11-30 (×7): 200 mg via ORAL
  Filled 2020-11-23 (×7): qty 2

## 2020-11-23 MED ORDER — PROTAMINE SULFATE 10 MG/ML IV SOLN
INTRAVENOUS | Status: DC | PRN
Start: 2020-11-23 — End: 2020-11-23
  Administered 2020-11-23: 310 mg via INTRAVENOUS
  Administered 2020-11-23: 30 mg via INTRAVENOUS

## 2020-11-23 MED ORDER — CHLORHEXIDINE GLUCONATE 0.12 % MT SOLN
15.0000 mL | OROMUCOSAL | Status: AC
  Administered 2020-11-23: 15 mL via OROMUCOSAL

## 2020-11-23 MED ORDER — SODIUM BICARBONATE 8.4 % IV SOLN
100.0000 meq | Freq: Once | INTRAVENOUS | Status: AC
  Administered 2020-11-23: 100 meq via INTRAVENOUS

## 2020-11-23 MED ORDER — DULOXETINE HCL 60 MG PO CPEP
60.0000 mg | ORAL_CAPSULE | Freq: Two times a day (BID) | ORAL | Status: DC
  Administered 2020-11-24 – 2020-11-30 (×13): 60 mg via ORAL
  Filled 2020-11-23 (×13): qty 1

## 2020-11-23 MED ORDER — GLYCOPYRROLATE 0.2 MG/ML IJ SOLN
INTRAMUSCULAR | Status: DC | PRN
  Administered 2020-11-23: .2 mg via INTRAVENOUS

## 2020-11-23 MED ORDER — CHLORHEXIDINE GLUCONATE 0.12 % MT SOLN
15.0000 mL | Freq: Two times a day (BID) | OROMUCOSAL | Status: DC
  Administered 2020-11-24 – 2020-11-30 (×9): 15 mL via OROMUCOSAL
  Filled 2020-11-23 (×12): qty 15

## 2020-11-23 MED ORDER — EPHEDRINE SULFATE-NACL 50-0.9 MG/10ML-% IV SOSY
PREFILLED_SYRINGE | INTRAVENOUS | Status: DC | PRN
  Administered 2020-11-23: 5 mg via INTRAVENOUS

## 2020-11-23 MED ORDER — BISACODYL 10 MG RE SUPP: 10.0000 mg | Freq: Every day | RECTAL | Status: DC

## 2020-11-23 MED ORDER — SODIUM CHLORIDE 0.9 % IV SOLN: INTRAVENOUS | Status: DC

## 2020-11-23 MED ORDER — VASOPRESSIN 20 UNIT/ML IV SOLN
INTRAVENOUS | Status: DC | PRN
  Administered 2020-11-23: 1 [IU] via INTRAVENOUS

## 2020-11-23 MED ORDER — ASPIRIN EC 325 MG PO TBEC
325.0000 mg | DELAYED_RELEASE_TABLET | Freq: Every day | ORAL | Status: DC
  Administered 2020-11-24 – 2020-11-26 (×3): 325 mg via ORAL
  Filled 2020-11-23 (×3): qty 1

## 2020-11-23 MED ORDER — LACTATED RINGERS IV SOLN: 500.0000 mL | Freq: Once | INTRAVENOUS | Status: DC | PRN

## 2020-11-23 MED ORDER — EPHEDRINE 5 MG/ML INJ
INTRAVENOUS | Status: AC
  Filled 2020-11-23: qty 5

## 2020-11-23 MED ORDER — NICARDIPINE HCL IN NACL 20-0.86 MG/200ML-% IV SOLN: 0.0000 mg/h | INTRAVENOUS | Status: DC

## 2020-11-23 MED ORDER — HEMOSTATIC AGENTS (NO CHARGE) OPTIME
TOPICAL | Status: DC | PRN
  Administered 2020-11-23 (×2): 1 via TOPICAL

## 2020-11-23 MED ORDER — ACETAMINOPHEN 500 MG PO TABS
1000.0000 mg | ORAL_TABLET | Freq: Four times a day (QID) | ORAL | Status: AC
  Administered 2020-11-23 – 2020-11-28 (×20): 1000 mg via ORAL
  Filled 2020-11-23 (×20): qty 2

## 2020-11-23 MED ORDER — SODIUM CHLORIDE 0.9% FLUSH
3.0000 mL | Freq: Two times a day (BID) | INTRAVENOUS | Status: DC
  Administered 2020-11-24 – 2020-11-27 (×7): 3 mL via INTRAVENOUS

## 2020-11-23 MED ORDER — INSULIN REGULAR(HUMAN) IN NACL 100-0.9 UT/100ML-% IV SOLN: INTRAVENOUS | Status: DC

## 2020-11-23 MED ORDER — CEFAZOLIN SODIUM-DEXTROSE 2-4 GM/100ML-% IV SOLN
2.0000 g | Freq: Three times a day (TID) | INTRAVENOUS | Status: AC
  Administered 2020-11-23 – 2020-11-25 (×6): 2 g via INTRAVENOUS
  Filled 2020-11-23 (×6): qty 100

## 2020-11-23 MED ORDER — PLASMA-LYTE A IV SOLN: INTRAVENOUS | Status: DC | PRN

## 2020-11-23 MED ORDER — LACTATED RINGERS IV SOLN: INTRAVENOUS | Status: DC | PRN

## 2020-11-23 MED ORDER — METOPROLOL TARTRATE 12.5 MG HALF TABLET
12.5000 mg | ORAL_TABLET | Freq: Two times a day (BID) | ORAL | Status: DC
  Administered 2020-11-24 (×2): 12.5 mg via ORAL
  Filled 2020-11-23 (×2): qty 1

## 2020-11-23 MED ORDER — MORPHINE SULFATE (PF) 2 MG/ML IV SOLN
1.0000 mg | INTRAVENOUS | Status: DC | PRN
  Administered 2020-11-23 (×2): 2 mg via INTRAVENOUS
  Administered 2020-11-23 – 2020-11-24 (×3): 4 mg via INTRAVENOUS
  Administered 2020-11-24 (×2): 2 mg via INTRAVENOUS
  Filled 2020-11-23 (×3): qty 1
  Filled 2020-11-23 (×3): qty 2
  Filled 2020-11-23: qty 1

## 2020-11-23 MED ORDER — SODIUM CHLORIDE 0.9 % IV SOLN: 250.0000 mL | INTRAVENOUS | Status: DC

## 2020-11-23 MED ORDER — MIDAZOLAM HCL 2 MG/2ML IJ SOLN: 2.0000 mg | INTRAMUSCULAR | Status: DC | PRN

## 2020-11-23 MED ORDER — PANTOPRAZOLE SODIUM 40 MG PO TBEC
40.0000 mg | DELAYED_RELEASE_TABLET | Freq: Every day | ORAL | Status: DC
  Administered 2020-11-25 – 2020-11-30 (×6): 40 mg via ORAL
  Filled 2020-11-23 (×6): qty 1

## 2020-11-23 MED ORDER — CARIPRAZINE HCL 1.5 MG PO CAPS
1.5000 mg | ORAL_CAPSULE | Freq: Every day | ORAL | Status: DC
  Administered 2020-11-24 – 2020-11-30 (×7): 1.5 mg via ORAL
  Filled 2020-11-23 (×7): qty 1

## 2020-11-23 MED ORDER — MONTELUKAST SODIUM 10 MG PO TABS
10.0000 mg | ORAL_TABLET | Freq: Every day | ORAL | Status: DC
  Administered 2020-11-24 – 2020-11-29 (×6): 10 mg via ORAL
  Filled 2020-11-23 (×6): qty 1

## 2020-11-23 MED ORDER — BUPROPION HCL ER (XL) 300 MG PO TB24
300.0000 mg | ORAL_TABLET | Freq: Every day | ORAL | Status: DC
  Administered 2020-11-24 – 2020-11-29 (×6): 300 mg via ORAL
  Filled 2020-11-23 (×6): qty 1

## 2020-11-23 MED ORDER — DEXMEDETOMIDINE HCL IN NACL 400 MCG/100ML IV SOLN
0.0000 ug/kg/h | INTRAVENOUS | Status: DC
Start: 2020-11-23 — End: 2020-11-24

## 2020-11-23 MED ORDER — MIDAZOLAM HCL 5 MG/5ML IJ SOLN
INTRAMUSCULAR | Status: DC | PRN
  Administered 2020-11-23 (×4): 2 mg via INTRAVENOUS

## 2020-11-23 MED ORDER — FENTANYL CITRATE (PF) 250 MCG/5ML IJ SOLN
INTRAMUSCULAR | Status: DC | PRN
  Administered 2020-11-23: 50 ug via INTRAVENOUS
  Administered 2020-11-23: 200 ug via INTRAVENOUS
  Administered 2020-11-23: 750 ug via INTRAVENOUS
  Administered 2020-11-23: 50 ug via INTRAVENOUS
  Administered 2020-11-23: 150 ug via INTRAVENOUS
  Administered 2020-11-23: 50 ug via INTRAVENOUS

## 2020-11-23 MED ORDER — CHLORHEXIDINE GLUCONATE 0.12% ORAL RINSE (MEDLINE KIT): 15.0000 mL | Freq: Two times a day (BID) | OROMUCOSAL | Status: DC

## 2020-11-23 MED ORDER — DEXAMETHASONE SODIUM PHOSPHATE 10 MG/ML IJ SOLN
INTRAMUSCULAR | Status: AC
  Filled 2020-11-23: qty 1

## 2020-11-23 MED ORDER — DEXAMETHASONE SODIUM PHOSPHATE 10 MG/ML IJ SOLN
INTRAMUSCULAR | Status: DC | PRN
  Administered 2020-11-23: 10 mg via INTRAVENOUS

## 2020-11-23 MED ORDER — ONDANSETRON HCL 4 MG/2ML IJ SOLN
INTRAMUSCULAR | Status: AC
  Filled 2020-11-23: qty 2

## 2020-11-23 MED ORDER — MIDAZOLAM HCL (PF) 10 MG/2ML IJ SOLN
INTRAMUSCULAR | Status: AC
  Filled 2020-11-23: qty 2

## 2020-11-23 MED ORDER — ROCURONIUM BROMIDE 10 MG/ML (PF) SYRINGE
PREFILLED_SYRINGE | INTRAVENOUS | Status: AC
  Filled 2020-11-23: qty 20

## 2020-11-23 MED ORDER — PHENYLEPHRINE 40 MCG/ML (10ML) SYRINGE FOR IV PUSH (FOR BLOOD PRESSURE SUPPORT)
PREFILLED_SYRINGE | INTRAVENOUS | Status: DC | PRN
  Administered 2020-11-23 (×3): 120 ug via INTRAVENOUS
  Administered 2020-11-23: 160 ug via INTRAVENOUS
  Administered 2020-11-23: 80 ug via INTRAVENOUS
  Administered 2020-11-23 (×2): 120 ug via INTRAVENOUS
  Administered 2020-11-23 (×2): 80 ug via INTRAVENOUS

## 2020-11-23 MED ORDER — ACETAMINOPHEN 160 MG/5ML PO SOLN: 650.0000 mg | Freq: Once | ORAL | Status: AC

## 2020-11-23 MED ORDER — ONDANSETRON HCL 4 MG/2ML IJ SOLN
4.0000 mg | Freq: Four times a day (QID) | INTRAMUSCULAR | Status: DC | PRN
  Administered 2020-11-23: 4 mg via INTRAVENOUS
  Filled 2020-11-23: qty 2

## 2020-11-23 MED ORDER — METOPROLOL TARTRATE 5 MG/5ML IV SOLN
2.5000 mg | INTRAVENOUS | Status: DC | PRN
  Administered 2020-11-27 – 2020-11-28 (×3): 5 mg via INTRAVENOUS
  Filled 2020-11-23 (×3): qty 5

## 2020-11-23 MED ORDER — SODIUM CHLORIDE 0.9% FLUSH: 3.0000 mL | INTRAVENOUS | Status: DC | PRN

## 2020-11-23 MED ORDER — ORAL CARE MOUTH RINSE
15.0000 mL | Freq: Two times a day (BID) | OROMUCOSAL | Status: DC
  Administered 2020-11-24 – 2020-11-30 (×9): 15 mL via OROMUCOSAL

## 2020-11-23 MED ORDER — CHLORHEXIDINE GLUCONATE CLOTH 2 % EX PADS
6.0000 | MEDICATED_PAD | Freq: Every day | CUTANEOUS | Status: DC
  Administered 2020-11-23 – 2020-11-25 (×4): 6 via TOPICAL

## 2020-11-23 MED ORDER — CALCIUM CHLORIDE 10 % IV SOLN
INTRAVENOUS | Status: DC | PRN
  Administered 2020-11-23: 1 g via INTRAVENOUS

## 2020-11-23 MED ORDER — ALBUMIN HUMAN 5 % IV SOLN: INTRAVENOUS | Status: DC | PRN

## 2020-11-23 MED ORDER — ALBUTEROL SULFATE (2.5 MG/3ML) 0.083% IN NEBU
2.5000 mg | INHALATION_SOLUTION | RESPIRATORY_TRACT | Status: DC | PRN
  Administered 2020-11-24 – 2020-11-26 (×6): 2.5 mg via RESPIRATORY_TRACT
  Filled 2020-11-23 (×7): qty 3

## 2020-11-23 MED ORDER — SODIUM CHLORIDE 0.45 % IV SOLN: INTRAVENOUS | Status: DC | PRN

## 2020-11-23 MED ORDER — ASPIRIN 81 MG PO CHEW: 324.0000 mg | CHEWABLE_TABLET | Freq: Every day | ORAL | Status: DC

## 2020-11-23 MED ORDER — POTASSIUM CHLORIDE 10 MEQ/50ML IV SOLN: 10.0000 meq | INTRAVENOUS | Status: AC

## 2020-11-23 MED ORDER — PHENYLEPHRINE HCL-NACL 20-0.9 MG/250ML-% IV SOLN
0.0000 ug/min | INTRAVENOUS | Status: DC
  Filled 2020-11-23: qty 250

## 2020-11-23 MED ORDER — PROPOFOL 10 MG/ML IV BOLUS
INTRAVENOUS | Status: AC
  Filled 2020-11-23: qty 60

## 2020-11-23 MED ORDER — CHLORHEXIDINE GLUCONATE CLOTH 2 % EX PADS
6.0000 | MEDICATED_PAD | Freq: Once | CUTANEOUS | Status: AC
  Administered 2020-11-23: 6 via TOPICAL

## 2020-11-23 MED ORDER — FENTANYL CITRATE (PF) 250 MCG/5ML IJ SOLN
INTRAMUSCULAR | Status: AC
  Filled 2020-11-23: qty 25

## 2020-11-23 MED ORDER — PROPOFOL 10 MG/ML IV BOLUS
INTRAVENOUS | Status: DC | PRN
  Administered 2020-11-23: 90 mg via INTRAVENOUS

## 2020-11-23 MED ORDER — DEXTROSE 50 % IV SOLN: 0.0000 mL | INTRAVENOUS | Status: DC | PRN

## 2020-11-23 MED ORDER — CHLORHEXIDINE GLUCONATE 0.12 % MT SOLN: 15.0000 mL | Freq: Once | OROMUCOSAL | Status: DC

## 2020-11-23 MED ORDER — VASOPRESSIN 20 UNITS/100 ML INFUSION FOR SHOCK: 0.0000 [IU]/min | INTRAVENOUS | Status: DC

## 2020-11-23 MED ORDER — HEPARIN SODIUM (PORCINE) 1000 UNIT/ML IJ SOLN
INTRAMUSCULAR | Status: DC | PRN
  Administered 2020-11-23: 34000 [IU] via INTRAVENOUS

## 2020-11-23 MED ORDER — PHENYLEPHRINE 40 MCG/ML (10ML) SYRINGE FOR IV PUSH (FOR BLOOD PRESSURE SUPPORT)
PREFILLED_SYRINGE | INTRAVENOUS | Status: AC
  Filled 2020-11-23: qty 20

## 2020-11-23 MED ORDER — ORAL CARE MOUTH RINSE
15.0000 mL | OROMUCOSAL | Status: DC
  Administered 2020-11-23 (×2): 15 mL via OROMUCOSAL

## 2020-11-23 MED ORDER — DOBUTAMINE IN D5W 4-5 MG/ML-% IV SOLN: 0.0000 ug/kg/min | INTRAVENOUS | Status: DC

## 2020-11-23 SURGICAL SUPPLY — 85 items
BAG DECANTER FOR FLEXI CONT (MISCELLANEOUS) ×5 IMPLANT
BLADE CLIPPER SURG (BLADE) ×5 IMPLANT
BLADE STERNUM SYSTEM 6 (BLADE) ×5 IMPLANT
BNDG ELASTIC 4X5.8 VLCR STR LF (GAUZE/BANDAGES/DRESSINGS) ×5 IMPLANT
BNDG ELASTIC 6X5.8 VLCR STR LF (GAUZE/BANDAGES/DRESSINGS) ×5 IMPLANT
BNDG GAUZE ELAST 4 BULKY (GAUZE/BANDAGES/DRESSINGS) ×5 IMPLANT
CABLE SURGICAL S-101-97-12 (CABLE) ×5 IMPLANT
CANISTER SUCT 3000ML PPV (MISCELLANEOUS) ×5 IMPLANT
CANNULA MC2 2 STG 29/37 NON-V (CANNULA) ×4 IMPLANT
CANNULA MC2 TWO STAGE (CANNULA) ×1
CANNULA NON VENT 20FR 12 (CANNULA) ×5 IMPLANT
CATH ROBINSON RED A/P 18FR (CATHETERS) ×10 IMPLANT
CLIP RETRACTION 3.0MM CORONARY (MISCELLANEOUS) ×5 IMPLANT
CLIP VESOCCLUDE MED 24/CT (CLIP) IMPLANT
CLIP VESOCCLUDE SM WIDE 24/CT (CLIP) IMPLANT
CONN ST 1/2X1/2  BEN (MISCELLANEOUS) ×1
CONN ST 1/2X1/2 BEN (MISCELLANEOUS) ×4 IMPLANT
CONNECTOR BLAKE 2:1 CARIO BLK (MISCELLANEOUS) ×5 IMPLANT
CONTAINER PROTECT SURGISLUSH (MISCELLANEOUS) ×10 IMPLANT
DERMABOND ADVANCED (GAUZE/BANDAGES/DRESSINGS) ×1
DERMABOND ADVANCED .7 DNX12 (GAUZE/BANDAGES/DRESSINGS) ×4 IMPLANT
DRAIN CHANNEL 19F RND (DRAIN) ×15 IMPLANT
DRAIN CONNECTOR BLAKE 1:1 (MISCELLANEOUS) ×5 IMPLANT
DRAPE CARDIOVASCULAR INCISE (DRAPES) ×1
DRAPE INCISE IOBAN 66X45 STRL (DRAPES) IMPLANT
DRAPE SRG 135X102X78XABS (DRAPES) ×4 IMPLANT
DRAPE WARM FLUID 44X44 (DRAPES) ×5 IMPLANT
DRSG AQUACEL AG ADV 3.5X10 (GAUZE/BANDAGES/DRESSINGS) ×5 IMPLANT
DRSG COVADERM 4X14 (GAUZE/BANDAGES/DRESSINGS) ×5 IMPLANT
ELECT BLADE 4.0 EZ CLEAN MEGAD (MISCELLANEOUS) ×5
ELECT REM PT RETURN 9FT ADLT (ELECTROSURGICAL) ×10
ELECTRODE BLDE 4.0 EZ CLN MEGD (MISCELLANEOUS) ×4 IMPLANT
ELECTRODE REM PT RTRN 9FT ADLT (ELECTROSURGICAL) ×8 IMPLANT
FELT TEFLON 1X6 (MISCELLANEOUS) ×5 IMPLANT
GAUZE 4X4 16PLY ~~LOC~~+RFID DBL (SPONGE) ×5 IMPLANT
GAUZE SPONGE 4X4 12PLY STRL (GAUZE/BANDAGES/DRESSINGS) ×10 IMPLANT
GLOVE SURG ENC MOIS LTX SZ7 (GLOVE) ×10 IMPLANT
GLOVE SURG ENC TEXT LTX SZ7.5 (GLOVE) ×10 IMPLANT
GLOVE SURG MICRO LTX SZ6.5 (GLOVE) ×5 IMPLANT
GOWN STRL REUS W/ TWL LRG LVL3 (GOWN DISPOSABLE) ×16 IMPLANT
GOWN STRL REUS W/ TWL XL LVL3 (GOWN DISPOSABLE) ×8 IMPLANT
GOWN STRL REUS W/TWL LRG LVL3 (GOWN DISPOSABLE) ×4
GOWN STRL REUS W/TWL XL LVL3 (GOWN DISPOSABLE) ×2
HEMOSTAT POWDER SURGIFOAM 1G (HEMOSTASIS) ×10 IMPLANT
INSERT SUTURE HOLDER (MISCELLANEOUS) ×5 IMPLANT
KIT BASIN OR (CUSTOM PROCEDURE TRAY) ×5 IMPLANT
KIT SUCTION CATH 14FR (SUCTIONS) ×5 IMPLANT
KIT TURNOVER KIT B (KITS) ×5 IMPLANT
KIT VASOVIEW HEMOPRO 2 VH 4000 (KITS) ×5 IMPLANT
LEAD PACING MYOCARDI (MISCELLANEOUS) ×5 IMPLANT
MARKER GRAFT CORONARY BYPASS (MISCELLANEOUS) ×15 IMPLANT
NS IRRIG 1000ML POUR BTL (IV SOLUTION) ×25 IMPLANT
PACK ACCESSORY CANNULA KIT (KITS) ×5 IMPLANT
PACK E OPEN HEART (SUTURE) ×5 IMPLANT
PACK OPEN HEART (CUSTOM PROCEDURE TRAY) ×5 IMPLANT
PAD ARMBOARD 7.5X6 YLW CONV (MISCELLANEOUS) ×10 IMPLANT
PAD ELECT DEFIB RADIOL ZOLL (MISCELLANEOUS) ×5 IMPLANT
PENCIL BUTTON HOLSTER BLD 10FT (ELECTRODE) ×5 IMPLANT
POSITIONER HEAD DONUT 9IN (MISCELLANEOUS) ×5 IMPLANT
PUNCH AORTIC ROTATE 4.0MM (MISCELLANEOUS) ×10 IMPLANT
SET MPS 3-ND DEL (MISCELLANEOUS) ×5 IMPLANT
SPONGE T-LAP 18X18 ~~LOC~~+RFID (SPONGE) ×25 IMPLANT
SUPPORT HEART JANKE-BARRON (MISCELLANEOUS) ×5 IMPLANT
SUT BONE WAX W31G (SUTURE) ×5 IMPLANT
SUT ETHIBOND X763 2 0 SH 1 (SUTURE) ×10 IMPLANT
SUT MNCRL AB 3-0 PS2 18 (SUTURE) ×10 IMPLANT
SUT MNCRL AB 4-0 PS2 18 (SUTURE) ×5 IMPLANT
SUT PDS AB 1 CTX 36 (SUTURE) ×10 IMPLANT
SUT PROLENE 4 0 SH DA (SUTURE) ×5 IMPLANT
SUT PROLENE 5 0 C 1 36 (SUTURE) ×20 IMPLANT
SUT PROLENE 7 0 BV1 MDA (SUTURE) ×15 IMPLANT
SUT STEEL 6MS V (SUTURE) ×10 IMPLANT
SUT STEEL STERNAL CCS#1 18IN (SUTURE) ×15 IMPLANT
SUT VIC AB 2-0 CT1 27 (SUTURE) ×1
SUT VIC AB 2-0 CT1 TAPERPNT 27 (SUTURE) ×4 IMPLANT
SYSTEM SAHARA CHEST DRAIN ATS (WOUND CARE) ×5 IMPLANT
TAPE CLOTH SURG 4X10 WHT LF (GAUZE/BANDAGES/DRESSINGS) ×5 IMPLANT
TAPE PAPER 2X10 WHT MICROPORE (GAUZE/BANDAGES/DRESSINGS) ×5 IMPLANT
TOWEL GREEN STERILE (TOWEL DISPOSABLE) ×5 IMPLANT
TOWEL GREEN STERILE FF (TOWEL DISPOSABLE) ×5 IMPLANT
TRAY FOLEY SLVR 16FR TEMP STAT (SET/KITS/TRAYS/PACK) ×5 IMPLANT
TUBE SUCT INTRACARD DLP 20F (MISCELLANEOUS) ×5 IMPLANT
TUBING LAP HI FLOW INSUFFLATIO (TUBING) ×5 IMPLANT
UNDERPAD 30X36 HEAVY ABSORB (UNDERPADS AND DIAPERS) ×5 IMPLANT
WATER STERILE IRR 1000ML POUR (IV SOLUTION) ×10 IMPLANT

## 2020-11-23 NOTE — Progress Notes (Signed)
EVENING ROUNDS NOTE :     [address]       [place] [address]             [phone]                 Day of Surgery Procedure(s) (LRB): CORONARY ARTERY BYPASS GRAFTING (CABG) X FOUR, ON PUMP, USING LEFT INTERNAL MAMMARY ARTERY AND ENDOSCOPIALLY HARVESTED RIGHT GREATER SAPHENOUS VEIN CONDUITS (N/A) TRANSESOPHAGEAL ECHOCARDIOGRAM (TEE) (N/A) APPLICATION OF CELL SAVER ENDOVEIN HARVEST OF GREATER SAPHENOUS VEIN (Right)   Total Length of Stay:  LOS: 5 days  Events:   No events Weaning to extubate    BP (!) 142/81   Pulse 69   Temp 97.9 F (36.6 C)   Resp 19   Ht 5\' 10"  (1.778 m)   Wt 113.1 kg   SpO2 98%   BMI 35.79 kg/m   CVP:  [3 mmHg-16 mmHg] 6 mmHg  Vent Mode: SIMV;PSV;PRVC FiO2 (%):  [40 %-50 %] 40 % Set Rate:  [4 bmp-12 bmp] 12 bmp Vt Set:  [580 mL] 580 mL PEEP:  [5 cmH20] 5 cmH20 Pressure Support:  [10 cmH20] 10 cmH20 Plateau Pressure:  [21 cmH20] 21 cmH20   sodium chloride 10 mL/hr at 11/23/20 1700   [START ON 11/24/2020] sodium chloride     sodium chloride      ceFAZolin (ANCEF) IV Stopped (11/23/20 1502)   dexmedetomidine (PRECEDEX) IV infusion Stopped (11/23/20 1542)   DOBUTamine     famotidine (PEPCID) IV Stopped (11/23/20 1413)   insulin 0.7 Units/hr (11/23/20 1700)   lactated ringers     lactated ringers     lactated ringers 20 mL/hr at 11/23/20 1700   magnesium sulfate 20 mL/hr at 11/23/20 1700   niCARDipine     nitroGLYCERIN 0 mcg/min (11/23/20 1300)   norepinephrine (LEVOPHED) Adult infusion 0 mcg/min (11/23/20 1300)   phenylephrine (NEO-SYNEPHRINE) Adult infusion 25 mcg/min (11/23/20 1700)   vancomycin     vasopressin 0 Units/min (11/23/20 1300)    I/O last 3 completed shifts: In: 691.9 [P.O.:540; I.V.:151.9] Out: 1475 [Urine:1475]   CBC Latest Ref Rng & Units 11/23/2020 11/23/2020 11/23/2020  WBC 4.0 - 10.5 K/uL - - 9.9  Hemoglobin 13.0 - 17.0 g/dL 10.5(L) 11.2(L) 12.2(L)  Hematocrit 39.0 - 52.0 % 31.0(L) 33.0(L)  36.0(L)  Platelets 150 - 400 K/uL - - 133(L)    BMP Latest Ref Rng & Units 11/23/2020 11/23/2020 11/23/2020  Glucose 70 - 99 mg/dL - - 161(H)  BUN 8 - 23 mg/dL - - 29(H)  Creatinine 0.61 - 1.24 mg/dL - - 1.40(H)  Sodium 135 - 145 mmol/L 139 138 135  Potassium 3.5 - 5.1 mmol/L 4.4 4.1 4.6  Chloride 98 - 111 mmol/L - - 103  CO2 22 - 32 mmol/L - - -  Calcium 8.9 - 10.3 mg/dL - - -    ABG    Component Value Date/Time   PHART 7.284 (L) 11/23/2020 1631   PCO2ART 40.6 11/23/2020 1631   PO2ART 96 11/23/2020 1631   HCO3 19.4 (L) 11/23/2020 1631   TCO2 21 (L) 11/23/2020 1631   ACIDBASEDEF 7.0 (H) 11/23/2020 1631   O2SAT 97.0 11/23/2020 1631       Melodie Bouillon, MD 11/23/2020 5:21 PM

## 2020-11-23 NOTE — Progress Notes (Signed)
Pt informed by nurse and tech to removed pajama pants pt wanted to keep on at this time.

## 2020-11-23 NOTE — Progress Notes (Signed)
[name] paged r/t pt ABG after weaning trial. 2 amps bicarb ordered, MD on way to bedside.

## 2020-11-23 NOTE — Anesthesia Procedure Notes (Signed)
Procedure Name: Intubation Date/Time: 11/23/2020 7:52 AM Performed by: Vonna Drafts, CRNA Pre-anesthesia Checklist: Patient identified, Emergency Drugs available, Suction available and Patient being monitored Patient Re-evaluated:Patient Re-evaluated prior to induction Oxygen Delivery Method: Circle system utilized Preoxygenation: Pre-oxygenation with 100% oxygen Induction Type: IV induction Ventilation: Mask ventilation without difficulty Laryngoscope Size: Mac and 4 Grade View: Grade I Tube type: Oral Tube size: 8.0 mm Number of attempts: 1 Airway Equipment and Method: Stylet and Oral airway Placement Confirmation: ETT inserted through vocal cords under direct vision, positive ETCO2 and breath sounds checked- equal and bilateral Secured at: 23 cm Tube secured with: Tape Dental Injury: Teeth and Oropharynx as per pre-operative assessment

## 2020-11-23 NOTE — Transfer of Care (Signed)
Immediate Anesthesia Transfer of Care Note  Patient: Corey Woods  Procedure(s) Performed: CORONARY ARTERY BYPASS GRAFTING (CABG) X FOUR, ON PUMP, USING LEFT INTERNAL MAMMARY ARTERY AND ENDOSCOPIALLY HARVESTED RIGHT GREATER SAPHENOUS VEIN CONDUITS (Chest) TRANSESOPHAGEAL ECHOCARDIOGRAM (TEE) APPLICATION OF CELL SAVER ENDOVEIN HARVEST OF GREATER SAPHENOUS VEIN (Right: Leg Upper)  Patient Location: SICU  Anesthesia Type:General  Level of Consciousness: Patient remains intubated per anesthesia plan  Airway & Oxygen Therapy: Patient remains intubated per anesthesia plan and Patient placed on Ventilator (see vital sign flow sheet for setting)  Post-op Assessment: Report given to RN and Post -op Vital signs reviewed and stable  Post vital signs: Reviewed and stable  Last Vitals:  Vitals Value Taken Time  BP    Temp    Pulse    Resp    SpO2      Last Pain:  Vitals:   11/22/20 2051  TempSrc: Oral  PainSc:       Patients Stated Pain Goal: 0 (00/34/91 7915)  Complications: No notable events documented.

## 2020-11-23 NOTE — Op Note (Signed)
[address]       Corey Woods,[place] [address]             [phone]                                          11/23/2020 Patient:  Corey Woods Pre-Op Dx: NSTEMI 3V CAD DM HTN   Post-op Dx:  same Procedure: CABG X 4.  LIMA LAD, RSVG PDA, OM2, 2nd Diagonal   Endoscopic greater saphenous vein harvest on the right   Surgeon and Role:      * Laryah Neuser, Lucile Crater, MD - [place], [facility] - assisting An experienced assistant was required given the complexity of this surgery and the standard of surgical care. The assistant was needed for exposure, dissection, suctioning, retraction of delicate tissues and sutures, instrument exchange and for overall help during this procedure.    Anesthesia  general EBL:  600 ml Blood Administration: none Xclamp Time:  62 min Pump Time:  108 min  Drains: 19 F blake drain: L, mediastinal  Wires: Ventricular Counts: correct   Indications: This is a 75 year old gentleman with a history of coronary artery disease who presents with an NSTEMI.  Left heart cath shows severe three-vessel coronary disease.  His echocardiogram shows mildly reduced left ventricular function, and preserved right ventricular function.  There is no significant valvular disease.  On review of his images all of his coronaries are small however given his presentation surgical revascularization is his only option.  He is agreeable to proceed and is tentatively scheduled for 11/23/2020. Findings: Good vein, good LIMA.  Calcified LAD, small calcified PDA.  Small OM2, moderate sized diagonal.  Operative Technique: All invasive lines were placed in pre-op holding.  After the risks, benefits and alternatives were thoroughly discussed, the patient was brought to the operative theatre.  Anesthesia was induced, and the patient was prepped and draped in normal sterile fashion.  An appropriate surgical pause was performed, and pre-operative antibiotics were dosed  accordingly.  We began with simultaneous incisions along the right leg for harvesting of the greater saphenous vein and the chest for the sternotomy.  In regards to the sternotomy, this was carried down with bovie cautery, and the sternum was divided with a reciprocating saw.  Meticulous hemostasis was obtained.  The left internal thoracic artery was exposed and harvested in in pedicled fashion.  The patient was systemically heparinized, and the artery was divided distally, and placed in a papaverine sponge.    The sternal elevator was removed, and a retractor was placed.  The pericardium was divided in the midline and fashioned into a cradle with pericardial stitches.   After we confirmed an appropriate ACT, the ascending aorta was cannulated in standard fashion.  The right atrial appendage was used for venous cannulation site.  Cardiopulmonary bypass was initiated, and the heart retractor was placed. The cross clamp was applied, and a dose of anterograde cardioplegia was given with good arrest of the heart.  We moved to the posterior wall of the heart, and found a good target on the PDA.  An arteriotomy was made, and the vein graft was anastomosed to it in an end to side fashion.  Next we exposed the lateral wall, and found a good target on the OM2.  An end to side anastomosis with  the vein graft was then created.  Next, we exposed the anterior wall of the heart and identified a good target on Diagonal.   An arteriotomy was created.  The vein was anastomosed in an end to side fashion.  Finally, we exposed a good target on the  LAD, and fashioned an end to side anastomosis between it and the LITA.  We began to re-warm, and a re-animation dose of cardioplegia was given.  The heart was de-aired, and the cross clamp was removed.  Meticulous hemostasis was obtained.    A partial occludding clamp was then placed on the ascending aorta, and we created an end to side anastomosis between it and the proximal vein  grafts.  Rings were placed on the proximal anastomosis.  Hemostasis was obtained, and we separated from cardiopulmonary bypass without event.  The heparin was reversed with protamine.  Chest tubes and wires were placed, and the sternum was re-approximated with sternal wires.  The soft tissue and skin were re-approximated wth absorbable suture.    The patient tolerated the procedure without any immediate complications, and was transferred to the ICU in guarded condition.  Corey Woods

## 2020-11-23 NOTE — Progress Notes (Signed)
Attempted wean parameters NIF -18, VC 800, ph 7.28, patient still sleepy.  Placed on full support and will attempt again shortly.

## 2020-11-23 NOTE — Anesthesia Procedure Notes (Signed)
Central Venous Catheter Insertion Performed by: Belinda Block, MD, anesthesiologist Start/End11/23/2022 7:00 AM, 11/23/2020 7:15 AM Patient location: Pre-op. Preanesthetic checklist: patient identified, IV checked, site marked, risks and benefits discussed, surgical consent, monitors and equipment checked, pre-op evaluation, timeout performed and anesthesia consent Position: Trendelenburg Lidocaine 1% used for infiltration and patient sedated Hand hygiene performed  and maximum sterile barriers used  Catheter size: 8.5 Fr Sheath introducer Procedure performed using ultrasound guided technique. Ultrasound Notes:anatomy identified, needle tip was noted to be adjacent to the nerve/plexus identified, no ultrasound evidence of intravascular and/or intraneural injection and image(s) printed for medical record Attempts: 1 Following insertion, line sutured and dressing applied. Post procedure assessment: blood return through all ports, free fluid flow and no air  Patient tolerated the procedure well with no immediate complications. Additional procedure comments: Flow trac triple lumen inserted without difficulty. Dr. Nyoka Cowden.

## 2020-11-23 NOTE — Progress Notes (Signed)
     [address]       [place] [address]             [phone]       No events Vitals:   11/22/20 2051 11/23/20 0638  BP: (!) 157/74 (!) 142/81  Pulse: 81 72  Resp: 18 20  Temp: 98 F (36.7 C)   SpO2: 97% 95%   Alert NAD Sinus EWOB  OR today for CABG  Jairo Bellew O Alexsandro Salek

## 2020-11-23 NOTE — Anesthesia Procedure Notes (Signed)
Arterial Line Insertion Start/End11/23/2022 7:00 AM, 11/23/2020 7:10 AM Performed by: Effie Berkshire, MD, Vonna Drafts, CRNA, CRNA  Preanesthetic checklist: patient identified, IV checked, site marked, risks and benefits discussed, surgical consent, monitors and equipment checked, pre-op evaluation, timeout performed and anesthesia consent Lidocaine 1% used for infiltration Right, radial was placed Catheter size: 20 G Hand hygiene performed  and maximum sterile barriers used   Attempts: 1 Procedure performed without using ultrasound guided technique. Following insertion, Biopatch and dressing applied. Post procedure assessment: normal  Patient tolerated the procedure well with no immediate complications.

## 2020-11-23 NOTE — Brief Op Note (Signed)
11/18/2020 - 11/23/2020  9:43 AM  PATIENT:  Corey Woods  75 y.o. male  PRE-OPERATIVE DIAGNOSIS:  CORONARY ARTERY DISEASE  POST-OPERATIVE DIAGNOSIS:  CORONARY ARTERY DISEASE  PROCEDURE:  Procedure(s): CORONARY ARTERY BYPASS GRAFTING (CABG) X FOUR, ON PUMP, USING LEFT INTERNAL MAMMARY ARTERY AND ENDOSCOPIALLY HARVESTED RIGHT GREATER SAPHENOUS VEIN CONDUITS (N/A) TRANSESOPHAGEAL ECHOCARDIOGRAM (TEE) (N/A) APPLICATION OF CELL SAVER ENDOVEIN HARVEST OF GREATER SAPHENOUS VEIN (Right)  SURGEON:  Surgeon(s) and Role:    * Lightfoot, Lucile Crater, MD - Primary  PHYSICIAN ASSISTANT: Sabin Gibeault PA-C  ASSISTANTS: STAFF   ANESTHESIA:   general  EBL:  1193 mL   BLOOD ADMINISTERED:none  DRAINS:  LEFT PLEURAL AND MEDIASTINAL CHEST TUBES    LOCAL MEDICATIONS USED:  NONE  SPECIMEN:  No Specimen  DISPOSITION OF SPECIMEN:  N/A  COUNTS:  YES  TOURNIQUET:  * No tourniquets in log *  DICTATION: .Dragon Dictation  PLAN OF CARE: Admit to inpatient   PATIENT DISPOSITION:  ICU - intubated and hemodynamically stable.   Delay start of Pharmacological VTE agent (>24hrs) due to surgical blood loss or risk of bleeding: yes  COMPLICATIONS: NO KNOWN

## 2020-11-23 NOTE — Procedures (Signed)
Extubation Procedure Note  Patient Details:   Name: Corey Woods DOB: 1945-09-01 MRN: [id number]   Airway Documentation:    Vent end date: 11/23/20 Vent end time: 1825   Evaluation  O2 sats: stable throughout Complications: No apparent complications Patient did tolerate procedure well. Bilateral Breath Sounds: Diminished   Yes  Pt extubated to [place] with RN at bedside. Positive cuff leak noted. NIF & VC within measurements.Pt is tolerating well. RT will continue to monitor.  Cathie Olden 11/23/2020, 6:31 PM

## 2020-11-23 NOTE — Anesthesia Postprocedure Evaluation (Signed)
Anesthesia Post Note  Patient: Corey Woods  Procedure(s) Performed: CORONARY ARTERY BYPASS GRAFTING (CABG) X FOUR, ON PUMP, USING LEFT INTERNAL MAMMARY ARTERY AND ENDOSCOPIALLY HARVESTED RIGHT GREATER SAPHENOUS VEIN CONDUITS (Chest) TRANSESOPHAGEAL ECHOCARDIOGRAM (TEE) APPLICATION OF CELL SAVER ENDOVEIN HARVEST OF GREATER SAPHENOUS VEIN (Right: Leg Upper)     Patient location during evaluation: SICU Anesthesia Type: General Level of consciousness: sedated Pain management: pain level controlled Vital Signs Assessment: post-procedure vital signs reviewed and stable Respiratory status: patient remains intubated per anesthesia plan Cardiovascular status: stable Postop Assessment: no apparent nausea or vomiting Anesthetic complications: no   No notable events documented.  Last Vitals:  Vitals:   11/23/20 1645 11/23/20 1700  BP:    Pulse: 69 69  Resp: 15 19  Temp: 36.6 C 36.6 C  SpO2: 98% 98%    Last Pain:  Vitals:   11/23/20 1600  TempSrc: Bladder  PainSc:                  Effie Berkshire

## 2020-11-23 NOTE — Consult Note (Addendum)
NAME:  Corey Woods, MRN:  [id number], DOB:  [birth year], LOS: 5 ADMISSION DATE:  11/18/2020, CONSULTATION DATE:  11/23 REFERRING MD:  [name], CHIEF COMPLAINT:  post CABG   History of Present Illness:  Patient is a 75 yo M with pertinent PMH of CAD, HLD, HTN presents to [facility] on 11/18 with N STEMI.  On 11/18, patient admitted to [facility] with Chest pain. EKG showed NSTEMI. Troponins elevated. Transferred to [facility]. Patient's left heart cath showed severe 3 vessel coronary disease.  Echo showed LVEF of 40 to 45%, with grade 1 diastolic dysfunction.  TCTS consulted and scheduled patient for CABG on 11/23.  Patient has been treated with IV heparin, aspirin, statin, metoprolol.  On 11/23, patient received CABG x4.  Patient tolerated procedure well and sent to cardiac ICU.  Patient remains intubated and on pressors.  Currently being weaned off per protocol.  PCCM consulted for medical management.  Pertinent  Medical History   Past Medical History:  Diagnosis Date   Asthmatic bronchitis    Benign bladder tumor    CAD (coronary artery disease) 2002   BMS x 2 and DES - [place] (multivessel)   Chronic renal failure, stage 3 (moderate), unspecified whether stage 3a or 3b CKD ([place])    Depression    MI (myocardial infarction) ([place]) 2002   Orthostatic hypotension 2002   Prediabetes    Statin intolerance    Crestor - myalgias     Significant Hospital Events: Including procedures, antibiotic start and stop dates in addition to other pertinent events   11/23: patient returned post CABG intubated around 1300 to ICU; weaning per protocol  Interim History / Subjective:  Patient post op CABG x4 arrived around 1300 on 11/23 Intubated on PRVC SIMV 50%; sats 99% Off levo and vaso; weaning neo down to 35; receiving albumin Precedex running On insulin drip Receiving tranexamic acid  Objective   Blood pressure (!) 142/81, pulse 72, temperature 98 F (36.7 C), temperature source Oral, resp.  rate 20, height 5\' 10"  (1.778 m), weight 113.1 kg, SpO2 95 %.    Vent Mode: PRVC;SIMV;PSV FiO2 (%):  [50 %] 50 % Set Rate:  [12 bmp] 12 bmp Vt Set:  [580 mL] 580 mL PEEP:  [5 cmH20] 5 cmH20 Pressure Support:  [10 cmH20] 10 cmH20 Plateau Pressure:  [21 cmH20] 21 cmH20   Intake/Output Summary (Last 24 hours) at 11/23/2020 1339 Last data filed at 11/23/2020 1306 Gross per 24 hour  Intake 4065 ml  Output 1593 ml  Net 2472 ml   Filed Weights   11/20/20 0300 11/21/20 0542 11/22/20 0440  Weight: 115.9 kg 114.3 kg 113.1 kg    Examination: General:  critically ill appearing on mech vent HEENT: MM pink/moist; ETT in place Neuro: sedated; CV: s1s2, chest tube in place; surgical dressing on chest PULM:  dim clear BS bilaterally; intubated on mech vent simv 50% GI: soft, bsx4 active  Extremities: warm/dry, trace BLE edema  Skin: no rashes or lesions   Labs: ABG 7.33, 37.5, 69, 20.1 Hgb 12.2  Resolved Hospital Problem list     Assessment & Plan:   CABG x 4 CAD/Non-STEMI HFrEF: Echo showed LVEF of 40 to 35%, grade 1 diastolic dysfunction P: -TCTS primary  -post CABG protocol in place -continue to wean patient off precedex -giving albumin to help come off pressors -levo/vaso off; continue to wean off vaso -trend bmp/cbc/mag -replete electrolytes as needed -prn oxy and tramadol for pain -continue ASA -continue ancef/vanc  for surgical ppx -restart metoprolol per TCTS -monitor chest tube output -check PT/PTT/INR per protocol  Post Op Respiratory Failure requiring mechanical ventilation P: -continue mechanical ventilation with SIMV and wean per protocol  -wean precedex per protocol -vap prevention in place -check abg -wean fio2 for sats >92% -continue to wean precedex and attempt PSV -plan for extubation per protocol later today  HTN HLD P: -consider restarting BP meds when off pressors and BP stable -continue statin  Prediabetes P: -currently on insulin drip  per protocol -cbg monitoring -will need to transition to SSI when off insulin drip  CKD III P: -Trend BMP / urinary output -Replace electrolytes as indicated -Avoid nephrotoxic agents, ensure adequate renal perfusion  Asthmatic bronchitis P: -continue singulair -albuterol prn for wheezing  Depression P: -consider restarting home meds tomorrow: wellbutrin, vraylar, cymbalta  Benign bladder tumor P: -consider restarting home tamsulosin tomorrow  Best Practice (right click and "Reselect all SmartList Selections" daily)   Diet/type: NPO DVT prophylaxis: SCD GI prophylaxis: H2B and PPI Lines: Central line and Arterial Line Foley:  Yes, and it is still needed Code Status:  full code Last date of multidisciplinary goals of care discussion pending  Labs   CBC: Recent Labs  Lab 11/19/20 0021 11/20/20 0151 11/21/20 0136 11/22/20 0547 11/23/20 0441 11/23/20 0808 11/23/20 1101 11/23/20 1109 11/23/20 1137 11/23/20 1201 11/23/20 1204  WBC 8.1 8.1 7.1 7.0 7.2  --   --   --   --   --   --   HGB 15.1 13.7 14.0 16.4 15.6   < > 11.8* 10.9* 10.5* 10.9* 11.2*  HCT 43.6 40.1 41.5 47.8 45.1   < > 33.7* 32.0* 31.0* 32.0* 33.0*  MCV 87.7 89.5 89.6 89.5 89.7  --   --   --   --   --   --   PLT 204 179 184 193 169  --  168  --   --   --   --    < > = values in this interval not displayed.    Basic Metabolic Panel: Recent Labs  Lab 11/18/20 1149 11/18/20 1319 11/19/20 0021 11/21/20 0136 11/23/20 0441 11/23/20 0808 11/23/20 0930 11/23/20 1004 11/23/20 1041 11/23/20 1109 11/23/20 1137 11/23/20 1201 11/23/20 1204  NA 134*  --  135 136 135 135 135   < > 134* 134* 134* 135 135  K 4.1  --  3.6 4.2 4.1 4.1 4.3   < > 5.0 5.2* 5.2* 4.6 4.6  CL 100  --  99 99 102 101 101  --  101  --  102  --  103  CO2 25  --  27 29 27   --   --   --   --   --   --   --   --   GLUCOSE 275*  --  218* 151* 115* 145* 147*  --  137*  --  164*  --  161*  BUN 27*  --  20 24* 25* 28* 27*  --  29*  --   31*  --  29*  CREATININE 1.37*  --  1.48* 1.53* 1.64* 1.60* 1.60*  --  1.50*  --  1.60*  --  1.40*  CALCIUM 9.0  --  9.3 9.3 9.4  --   --   --   --   --   --   --   --   MG  --  2.0  --   --   --   --   --   --   --   --   --   --   --    < > =  values in this interval not displayed.   GFR: Estimated Creatinine Clearance: 59.2 mL/min (A) (by C-G formula based on SCr of 1.4 mg/dL (H)). Recent Labs  Lab 11/20/20 0151 11/21/20 0136 11/22/20 0547 11/23/20 0441  WBC 8.1 7.1 7.0 7.2    Liver Function Tests: Recent Labs  Lab 11/18/20 1319 11/23/20 0441  AST 24 104*  ALT 31 120*  ALKPHOS 59 62  BILITOT 1.1 1.5*  PROT 6.3* 5.6*  ALBUMIN 3.6 3.6   Recent Labs  Lab 11/18/20 1319  LIPASE 28   No results for input(s): AMMONIA in the last 168 hours.  ABG    Component Value Date/Time   PHART 7.356 11/23/2020 1201   PCO2ART 45.0 11/23/2020 1201   PO2ART 230 (H) 11/23/2020 1201   HCO3 25.2 11/23/2020 1201   TCO2 24 11/23/2020 1204   ACIDBASEDEF 1.0 11/23/2020 1201   O2SAT 100.0 11/23/2020 1201     Coagulation Profile: Recent Labs  Lab 11/22/20 0547  INR 1.0    Cardiac Enzymes: No results for input(s): CKTOTAL, CKMB, CKMBINDEX, TROPONINI in the last 168 hours.  HbA1C: Hgb A1c MFr Bld  Date/Time Value Ref Range Status  11/19/2020 12:21 AM 6.8 (H) 4.8 - 5.6 % Final    Comment:    (NOTE) Pre diabetes:          5.7%-6.4%  Diabetes:              >6.4%  Glycemic control for   <7.0% adults with diabetes     CBG: Recent Labs  Lab 11/21/20 2129 11/22/20 0801 11/22/20 1135 11/22/20 1559 11/22/20 2139  GLUCAP 117* 156* 105* 129* 123*    Review of Systems:   Unable to obtain; patient intubated/sedated.  Past Medical History:  He,  has a past medical history of Asthmatic bronchitis, Benign bladder tumor, CAD (coronary artery disease) (2002), Chronic renal failure, stage 3 (moderate), unspecified whether stage 3a or 3b CKD ([place]), Depression, MI (myocardial  infarction) ([place]) (2002), Orthostatic hypotension (2002), Prediabetes, and Statin intolerance.   Surgical History:   Past Surgical History:  Procedure Laterality Date   CORONARY ANGIOPLASTY WITH STENT PLACEMENT     FOOT SURGERY     LEFT HEART CATH AND CORONARY ANGIOGRAPHY N/A 11/21/2020   Procedure: LEFT HEART CATH AND CORONARY ANGIOGRAPHY;  Surgeon: Martinique, Peter M, MD;  Location: [place];  Service: Cardiovascular;  Laterality: N/A;   TONSILLECTOMY       Social History:   reports that he quit smoking about 20 years ago. His smoking use included cigarettes. He has never used smokeless tobacco. He reports current alcohol use. He reports that he does not use drugs.   Family History:  His family history includes Heart disease in his father; Leukemia in his mother; Stroke in his father.   Allergies Allergies  Allergen Reactions   Ciprofloxacin      Home Medications  Prior to Admission medications   Medication Sig Start Date End Date Taking? Authorizing Provider  albuterol (VENTOLIN HFA) 108 (90 Base) MCG/ACT inhaler albuterol sulfate HFA 90 mcg/actuation aerosol inhaler  Inhale 2 puffs every 4 hours by inhalation route as needed.   Yes [provider]  buPROPion (WELLBUTRIN XL) 300 MG 24 hr tablet Take 300 mg by mouth daily.   Yes [provider]  cariprazine (VRAYLAR) 1.5 MG capsule Take 1.5 mg by mouth daily.   Yes [provider]  DULoxetine (CYMBALTA) 60 MG capsule Take 120 mg by mouth daily.  Yes [provider]  montelukast (SINGULAIR) 10 MG tablet Take 10 mg by mouth at bedtime.   Yes [provider]  tamsulosin (FLOMAX) 0.4 MG CAPS capsule Take 0.4 mg by mouth daily.   Yes [provider]  TESTOSTERONE AQUEOUS IM Inject 400 mg into the muscle See admin instructions. 400 mg once a week every Friday   Yes [provider]     Critical care time: 45 minutes    JD Rexene Agent [facility] 11/23/2020, 1:39 PM  Please see [email or web address] for pager details.  From 7A-7P if no response, please call [phone]. After hours, please call ELink [phone].

## 2020-11-23 NOTE — Hospital Course (Addendum)
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  He also has a known history of coronary artery disease having a heart attack in 2002 and subsequent percutaneous interventions.  He is unsure about which vessels were treated and what procedures were used.  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  He reported having chest pain off and on for the past few months but was awakened from sleep on 11/19/2020 with chest pain that was protracted.  In emergency room, EKG showed sinus rhythm with a few PVCs.  There was evidence of a septal infarct, age undetermined.  Initial troponin was 44 and a subsequent lab a few hours later was 180.  Having ruled in for non-ST elevation myocardial infarction, cardiology consult was requested.  Patient was seen by [name] and admitted to the hospital.  He was started on heparin infusion.  He was transferred to [facility] for further evaluation.  He remained stable with no further chest pain.  Left heart catheterization was conducted earlier today and demonstrates severe three-vessel coronary artery disease. CT surgery has been asked to evaluate [name] for consideration of coronary bypass grafting. [name] is resting in bed following his left heart catheterization earlier today.  He tells me he has had persistent cough and prior to the admission but has not had any coughing in the last 24 hours.  He denies having any lower extremity varicosities or symptoms of claudication.  He has no current dental issues.  His last dental visit was in the past 6 months.  The patient and all relevant studies were reviewed by [name] who recommended proceeding with coronary artery surgical revascularization.  Hospital  course patient remained medically stable to proceed and on 11/23/2020 he was taken the operating room at which time he underwent the following procedure: Coronary artery bypass grafting x4.  He tolerated the procedure well and was taken to the surgical intensive care unit in stable condition.  Postoperative hospital course:  Patient is doing well.  He was weaned from the ventilator using standard post cardiac surgical protocols without difficulty.   His chest tubes were removed on postoperative day #1.  He does have some expected post operative volume overload requiring diuretics.  Blood sugars have been managed with standard protocols.  He was on no preoperative diabetic medications with a hemoglobin A1c of 6.8.  He will need close follow-up as an outpatient.  He has been started on beta-blocker/statin/aspirin.  Epicardial pacer wires were removed on postop day #2.    He was transferred to the telemetry unit on postoperative day #2 for further rehab and pulmonary hygiene;however a bed was not available until 11/26. Events of 11/26 and earlier this am noted. Patient felt lousy earlier this am as he "broke out in a sweat" and per nurse report, he looked "mottled". He denied chest pain or shortness of breath. He states after given Lasix he "peed a lot" his breathing has improved. He may have had flash pulmonary edema. Plavix was also started due to NSTEMI and ec asa was decreased to 81 mg daily. He was still volume overloaded and diuresed accordingly. Of note, his HGA1C is 6.8. He has a history of pre diabetes. He will need close medical surveillance as an outpatient after  discharge. He will be provided nutritional information with discharge paperwork. On POD 5 he had episodes of atrial fibrillation and was started on oral amiodarone and has converted to sinus rhythm.  As he was placed on amiodarone TSH was obtained and noted to be elevated at 7.4.  He has no history of hypothyroidism.  A free T4 has been  ordered and result is normal.   He has diuresed well but creatinine has been somewhat variable.  It typically ranges in the 1.4-1.8 range.  Most recent value is1.67.  Weight is stable and he has not required any further diuretics. Incisions are healing well. Oxygen has been weaned off. He is tolerating routine rehab activities. At the time of discharge, the patient was felt to be quite stable.

## 2020-11-23 NOTE — Anesthesia Preprocedure Evaluation (Addendum)
Anesthesia Evaluation  Patient identified by MRN, date of birth, ID band Patient awake    Reviewed: Allergy & Precautions, NPO status , Patient's Chart, lab work & pertinent test results  Airway Mallampati: III  TM Distance: >3 FB Neck ROM: Full    Dental  (+) Teeth Intact, Dental Advisory Given, Caps   Pulmonary asthma , former smoker,    breath sounds clear to auscultation       Cardiovascular + CAD and + Cardiac Stents   Rhythm:Regular Rate:Normal  Echo:  1. Left ventricular ejection fraction, by estimation, is 40 to 45%. The  left ventricle has mild to moderately decreased function. The left  ventricle has no regional wall motion abnormalities. Left ventricular  diastolic parameters are consistent with  Grade I diastolic dysfunction (impaired relaxation).  2. Right ventricular systolic function is normal. The right ventricular  size is normal.  3. The mitral valve is normal in structure. Mild to moderate mitral valve  regurgitation. No evidence of mitral stenosis.  4. The aortic valve is tricuspid. Aortic valve regurgitation is not  visualized. Aortic valve sclerosis is present, with no evidence of aortic  valve stenosis.  5. The inferior vena cava is normal in size with greater than 50%  respiratory variability, suggesting right atrial pressure of 3 mmHg.    Neuro/Psych PSYCHIATRIC DISORDERS Depression    GI/Hepatic negative GI ROS, Neg liver ROS,   Endo/Other  negative endocrine ROS  Renal/GU Renal disease     Musculoskeletal negative musculoskeletal ROS (+)   Abdominal Normal abdominal exam  (+)   Peds  Hematology negative hematology ROS (+)   Anesthesia Other Findings   Reproductive/Obstetrics                            Anesthesia Physical Anesthesia Plan  ASA: 4  Anesthesia Plan: General   Post-op Pain Management:    Induction: Intravenous  PONV Risk Score and  Plan: 2 and Ondansetron and Treatment may vary due to age or medical condition  Airway Management Planned: Oral ETT  Additional Equipment: Arterial line, CVP, TEE and Ultrasound Guidance Line Placement  Intra-op Plan:   Post-operative Plan: Post-operative intubation/ventilation  Informed Consent: I have reviewed the patients History and Physical, chart, labs and discussed the procedure including the risks, benefits and alternatives for the proposed anesthesia with the patient or authorized representative who has indicated his/her understanding and acceptance.     Dental advisory given  Plan Discussed with: CRNA  Anesthesia Plan Comments:        Anesthesia Quick Evaluation

## 2020-11-24 ENCOUNTER — Encounter (HOSPITAL_COMMUNITY): Payer: Self-pay | Admitting: Thoracic Surgery (Cardiothoracic Vascular Surgery)

## 2020-11-24 ENCOUNTER — Inpatient Hospital Stay (HOSPITAL_COMMUNITY): Payer: Medicare Other

## 2020-11-24 DIAGNOSIS — I214 Non-ST elevation (NSTEMI) myocardial infarction: Secondary | ICD-10-CM | POA: Diagnosis not present

## 2020-11-24 LAB — CBC
HCT: 36.8 % — ABNORMAL LOW (ref 39.0–52.0)
HCT: 38 % — ABNORMAL LOW (ref 39.0–52.0)
Hemoglobin: 12.6 g/dL — ABNORMAL LOW (ref 13.0–17.0)
Hemoglobin: 12.7 g/dL — ABNORMAL LOW (ref 13.0–17.0)
MCH: 31 pg (ref 26.0–34.0)
MCH: 30.6 pg (ref 26.0–34.0)
MCHC: 34.2 g/dL (ref 30.0–36.0)
MCHC: 33.4 g/dL (ref 30.0–36.0)
MCV: 90.4 fL (ref 80.0–100.0)
MCV: 91.6 fL (ref 80.0–100.0)
Platelets: 155 10*3/uL (ref 150–400)
Platelets: 153 10*3/uL (ref 150–400)
RBC: 4.07 MIL/uL — ABNORMAL LOW (ref 4.22–5.81)
RBC: 4.15 MIL/uL — ABNORMAL LOW (ref 4.22–5.81)
RDW: 13.1 % (ref 11.5–15.5)
RDW: 13.3 % (ref 11.5–15.5)
WBC: 12 10*3/uL — ABNORMAL HIGH (ref 4.0–10.5)
WBC: 12.1 10*3/uL — ABNORMAL HIGH (ref 4.0–10.5)
nRBC: 0 % (ref 0.0–0.2)
nRBC: 0 % (ref 0.0–0.2)

## 2020-11-24 LAB — BASIC METABOLIC PANEL
Anion gap: 7 (ref 5–15)
Anion gap: 7 (ref 5–15)
BUN: 29 mg/dL — ABNORMAL HIGH (ref 8–23)
BUN: 31 mg/dL — ABNORMAL HIGH (ref 8–23)
CO2: 20 mmol/L — ABNORMAL LOW (ref 22–32)
CO2: 25 mmol/L (ref 22–32)
Calcium: 7.8 mg/dL — ABNORMAL LOW (ref 8.9–10.3)
Calcium: 8.5 mg/dL — ABNORMAL LOW (ref 8.9–10.3)
Chloride: 107 mmol/L (ref 98–111)
Chloride: 100 mmol/L (ref 98–111)
Creatinine, Ser: 1.61 mg/dL — ABNORMAL HIGH (ref 0.61–1.24)
Creatinine, Ser: 1.85 mg/dL — ABNORMAL HIGH (ref 0.61–1.24)
GFR, Estimated: 45 mL/min — ABNORMAL LOW (ref >60)
GFR, Estimated: 38 mL/min — ABNORMAL LOW (ref >60)
Glucose, Bld: 107 mg/dL — ABNORMAL HIGH (ref 70–99)
Glucose, Bld: 153 mg/dL — ABNORMAL HIGH (ref 70–99)
Potassium: 4.5 mmol/L (ref 3.5–5.1)
Potassium: 4.4 mmol/L (ref 3.5–5.1)
Sodium: 134 mmol/L — ABNORMAL LOW (ref 135–145)
Sodium: 132 mmol/L — ABNORMAL LOW (ref 135–145)

## 2020-11-24 LAB — HEPATIC FUNCTION PANEL
ALT: 61 U/L — ABNORMAL HIGH (ref 0–44)
AST: 68 U/L — ABNORMAL HIGH (ref 15–41)
Albumin: 3.1 g/dL — ABNORMAL LOW (ref 3.5–5.0)
Alkaline Phosphatase: 37 U/L — ABNORMAL LOW (ref 38–126)
Bilirubin, Direct: 0.3 mg/dL — ABNORMAL HIGH (ref 0.0–0.2)
Indirect Bilirubin: 1.4 mg/dL — ABNORMAL HIGH (ref 0.3–0.9)
Total Bilirubin: 1.7 mg/dL — ABNORMAL HIGH (ref 0.3–1.2)
Total Protein: 4.9 g/dL — ABNORMAL LOW (ref 6.5–8.1)

## 2020-11-24 LAB — GLUCOSE, CAPILLARY
Glucose-Capillary: 103 mg/dL — ABNORMAL HIGH (ref 70–99)
Glucose-Capillary: 110 mg/dL — ABNORMAL HIGH (ref 70–99)
Glucose-Capillary: 120 mg/dL — ABNORMAL HIGH (ref 70–99)
Glucose-Capillary: 121 mg/dL — ABNORMAL HIGH (ref 70–99)
Glucose-Capillary: 122 mg/dL — ABNORMAL HIGH (ref 70–99)
Glucose-Capillary: 153 mg/dL — ABNORMAL HIGH (ref 70–99)
Glucose-Capillary: 159 mg/dL — ABNORMAL HIGH (ref 70–99)

## 2020-11-24 LAB — MAGNESIUM
Magnesium: 2.4 mg/dL (ref 1.7–2.4)
Magnesium: 2.5 mg/dL — ABNORMAL HIGH (ref 1.7–2.4)

## 2020-11-24 MED ORDER — INSULIN ASPART 100 UNIT/ML IJ SOLN
0.0000 [IU] | INTRAMUSCULAR | Status: DC
Start: 2020-11-24 — End: 2020-11-26
  Administered 2020-11-24 – 2020-11-25 (×4): 2 [IU] via SUBCUTANEOUS
  Administered 2020-11-25 (×3): 4 [IU] via SUBCUTANEOUS
  Administered 2020-11-26 (×2): 2 [IU] via SUBCUTANEOUS
  Administered 2020-11-26: 4 [IU] via SUBCUTANEOUS
  Administered 2020-11-26: 2 [IU] via SUBCUTANEOUS

## 2020-11-24 MED ORDER — ENOXAPARIN SODIUM 40 MG/0.4ML IJ SOSY
40.0000 mg | PREFILLED_SYRINGE | Freq: Every day | INTRAMUSCULAR | Status: DC
  Administered 2020-11-24 – 2020-11-29 (×6): 40 mg via SUBCUTANEOUS
  Filled 2020-11-24 (×7): qty 0.4

## 2020-11-24 MED ORDER — FUROSEMIDE 10 MG/ML IJ SOLN
40.0000 mg | Freq: Once | INTRAMUSCULAR | Status: AC
  Administered 2020-11-24: 40 mg via INTRAVENOUS
  Filled 2020-11-24: qty 4

## 2020-11-24 NOTE — Progress Notes (Signed)
1 Day Post-Op Procedure(s) (LRB): CORONARY ARTERY BYPASS GRAFTING (CABG) X FOUR, ON PUMP, USING LEFT INTERNAL MAMMARY ARTERY AND ENDOSCOPIALLY HARVESTED RIGHT GREATER SAPHENOUS VEIN CONDUITS (N/A) TRANSESOPHAGEAL ECHOCARDIOGRAM (TEE) (N/A) APPLICATION OF CELL SAVER ENDOVEIN HARVEST OF GREATER SAPHENOUS VEIN (Right) Subjective: Complains of pain.  Objective: Vital signs in last 24 hours: Temp:  [95.5 F (35.3 C)-99.9 F (37.7 C)] 97.8 F (36.6 C) (11/24 1150) Pulse Rate:  [46-89] 73 (11/24 1000) Cardiac Rhythm: Normal sinus rhythm (11/24 0800) Resp:  [14-29] 24 (11/24 1000) BP: (105-134)/(47-104) 116/55 (11/24 1000) SpO2:  [88 %-100 %] 93 % (11/24 1000) Arterial Line BP: (78-141)/(46-68) 121/48 (11/24 0700) FiO2 (%):  [40 %-50 %] 40 % (11/23 1747) Weight:  [118.7 kg] 118.7 kg (11/24 0500)  Hemodynamic parameters for last 24 hours: CVP:  [1 mmHg-23 mmHg] 8 mmHg  Intake/Output from previous day: 11/23 0701 - 11/24 0700 In: 7129.4 [P.O.:340; I.V.:3563.5; Blood:835; IV Piggyback:2391] Out: 8676 [Urine:1850; Blood:1193; Chest Tube:290] Intake/Output this shift: Total I/O In: 120.1 [I.V.:120.1] Out: 220 [Urine:180; Chest Tube:40]  General appearance: alert and cooperative Neurologic: intact Heart: regular rate and rhythm, S1, S2 normal, no murmur Lungs: clear to auscultation bilaterally Extremities: edema mild Wound: dressing dry  Lab Results: Recent Labs    11/23/20 1917 11/23/20 1925 11/24/20 0350  WBC 10.6*  --  12.0*  HGB 12.6* 10.9* 12.6*  HCT 36.0* 32.0* 36.8*  PLT 158  --  155   BMET:  Recent Labs    11/23/20 1917 11/23/20 1925 11/24/20 0350  NA 134* 138 134*  K 4.7 4.3 4.5  CL 104  --  107  CO2 23  --  20*  GLUCOSE 146*  --  107*  BUN 29*  --  29*  CREATININE 1.55*  --  1.61*  CALCIUM 8.1*  --  7.8*    PT/INR:  Recent Labs    11/23/20 1310  LABPROT 16.2*  INR 1.3*   ABG    Component Value Date/Time   PHART 7.314 (L) 11/23/2020 1925    HCO3 21.1 11/23/2020 1925   TCO2 22 11/23/2020 1925   ACIDBASEDEF 5.0 (H) 11/23/2020 1925   O2SAT 95.0 11/23/2020 1925   CBG (last 3)  Recent Labs    11/24/20 0350 11/24/20 0736 11/24/20 1144  GLUCAP 122* 153* 103*   CXR: mild bibasilar atelectasis  Assessment/Plan: S/P Procedure(s) (LRB): CORONARY ARTERY BYPASS GRAFTING (CABG) X FOUR, ON PUMP, USING LEFT INTERNAL MAMMARY ARTERY AND ENDOSCOPIALLY HARVESTED RIGHT GREATER SAPHENOUS VEIN CONDUITS (N/A) TRANSESOPHAGEAL ECHOCARDIOGRAM (TEE) (N/A) APPLICATION OF CELL SAVER ENDOVEIN HARVEST OF GREATER SAPHENOUS VEIN (Right)  POD 1 Hemodynamically stable in sinus rhythm. Continue Lopressor.  Chest tube output minimal. Will remove.  Volume excess: wt is 12 lbs over preop. Start lasix  Stage 3 chronic kidney disease with baseline creat about 1.5.   IS, OOB, ambulate  DM: on no meds preop with Hgb A1c of 6.8. continue SSI.   LOS: 6 days    Gaye Pollack 11/24/2020

## 2020-11-24 NOTE — Progress Notes (Signed)
Progress Note  Patient Name: Corey Woods Date of Encounter: 11/24/2020  [facility] Cardiologist: Rozann Lesches, MD   Subjective   Feeling okay, complains of expected moderate sternal discomfort.  No shortness of breath.  Inpatient Medications    Scheduled Meds:  acetaminophen  1,000 mg Oral Q6H   Or   acetaminophen (TYLENOL) oral liquid 160 mg/5 mL  1,000 mg Per Tube Q6H   aspirin EC  325 mg Oral Daily   Or   aspirin  324 mg Per Tube Daily   atorvastatin  80 mg Oral q1800   bisacodyl  10 mg Oral Daily   Or   bisacodyl  10 mg Rectal Daily   buPROPion  300 mg Oral Daily   cariprazine  1.5 mg Oral Daily   chlorhexidine  15 mL Mouth Rinse BID   Chlorhexidine Gluconate Cloth  6 each Topical Daily   docusate sodium  200 mg Oral Daily   DULoxetine  60 mg Oral BID   insulin aspart  0-24 Units Subcutaneous Q4H   mouth rinse  15 mL Mouth Rinse q12n4p   metoprolol tartrate  12.5 mg Oral BID   Or   metoprolol tartrate  12.5 mg Per Tube BID   montelukast  10 mg Oral QHS   [START ON 11/25/2020] pantoprazole  40 mg Oral Daily   sodium chloride flush  3 mL Intravenous Q12H   Continuous Infusions:  sodium chloride 10 mL/hr at 11/24/20 0800   sodium chloride     sodium chloride      ceFAZolin (ANCEF) IV Stopped (11/24/20 0609)   dexmedetomidine (PRECEDEX) IV infusion Stopped (11/23/20 1542)   DOBUTamine     lactated ringers     lactated ringers     lactated ringers 20 mL/hr at 11/24/20 0800   niCARDipine     nitroGLYCERIN 0 mcg/min (11/23/20 1300)   norepinephrine (LEVOPHED) Adult infusion 0 mcg/min (11/23/20 1300)   phenylephrine (NEO-SYNEPHRINE) Adult infusion Stopped (11/23/20 2317)   vasopressin 0 Units/min (11/23/20 1300)   PRN Meds: sodium chloride, albuterol, lactated ringers, metoprolol tartrate, midazolam, morphine injection, ondansetron (ZOFRAN) IV, oxyCODONE, pneumococcal 23 valent vaccine, sodium chloride flush, traMADol   Vital Signs    Vitals:    11/24/20 0400 11/24/20 0500 11/24/20 0600 11/24/20 0747  BP: (!) 125/59 112/72 (!) 123/55   Pulse: 78 77 77   Resp: (!) 22 (!) 28 (!) 28   Temp: 99.7 F (37.6 C)   98.6 F (37 C)  TempSrc: Bladder   Oral  SpO2: 94% 94% 93%   Weight:  118.7 kg    Height:        Intake/Output Summary (Last 24 hours) at 11/24/2020 1015 Last data filed at 11/24/2020 0800 Gross per 24 hour  Intake 5159.49 ml  Output 3288 ml  Net 1871.49 ml   Last 3 Weights 11/24/2020 11/22/2020 11/21/2020  Weight (lbs) 261 lb 11 oz 249 lb 6.4 oz 252 lb  Weight (kg) 118.7 kg 113.127 kg 114.306 kg      Telemetry    Normal sinus rhythm with no arrhythmia- Personally Reviewed   Physical Exam  Alert, oriented, no distress.  Sitting up in a chair at the bedside. GEN: No acute distress.   Neck: No JVD Cardiac: RRR, no murmurs, rubs, or gallops.  Respiratory: Clear to auscultation bilaterally.  Sternal incision intact. GI: Soft, nontender, non-distended  MS: No edema; No deformity. Neuro:  Nonfocal  Psych: Normal affect   Labs    High  Sensitivity Troponin:   Recent Labs  Lab 11/18/20 1149 11/18/20 1319  TROPONINIHS 44* 180*     Chemistry Recent Labs  Lab 11/18/20 1319 11/19/20 0021 11/23/20 0441 11/23/20 0808 11/23/20 1204 11/23/20 1313 11/23/20 1917 11/23/20 1925 11/24/20 0350  NA  --    < > 135   < > 135   < > 134* 138 134*  K  --    < > 4.1   < > 4.6   < > 4.7 4.3 4.5  CL  --    < > 102   < > 103  --  104  --  107  CO2  --    < > 27  --   --   --  23  --  20*  GLUCOSE  --    < > 115*   < > 161*  --  146*  --  107*  BUN  --    < > 25*   < > 29*  --  29*  --  29*  CREATININE  --    < > 1.64*   < > 1.40*  --  1.55*  --  1.61*  CALCIUM  --    < > 9.4  --   --   --  8.1*  --  7.8*  MG 2.0  --   --   --   --   --  3.2*  --  2.4  PROT 6.3*  --  5.6*  --   --   --   --   --  4.9*  ALBUMIN 3.6  --  3.6  --   --   --   --   --  3.1*  AST 24  --  104*  --   --   --   --   --  68*  ALT 31  --   120*  --   --   --   --   --  61*  ALKPHOS 59  --  62  --   --   --   --   --  37*  BILITOT 1.1  --  1.5*  --   --   --   --   --  1.7*  GFRNONAA  --    < > 44*  --   --   --  47*  --  45*  ANIONGAP  --    < > 6  --   --   --  7  --  7   < > = values in this interval not displayed.    Lipids  Recent Labs  Lab 11/19/20 0021  CHOL 244*  TRIG 198*  HDL 59  LDLCALC 145*  CHOLHDL 4.1    Hematology Recent Labs  Lab 11/23/20 1310 11/23/20 1313 11/23/20 1917 11/23/20 1925 11/24/20 0350  WBC 9.9  --  10.6*  --  12.0*  RBC 3.99*  --  4.03*  --  4.07*  HGB 12.2*   < > 12.6* 10.9* 12.6*  HCT 36.0*   < > 36.0* 32.0* 36.8*  MCV 90.2  --  89.3  --  90.4  MCH 30.6  --  31.3  --  31.0  MCHC 33.9  --  35.0  --  34.2  RDW 12.7  --  12.8  --  13.1  PLT 133*  --  158  --  155   < > = values in this interval not displayed.  Thyroid No results for input(s): TSH, FREET4 in the last 168 hours.  BNPNo results for input(s): BNP, PROBNP in the last 168 hours.  DDimer No results for input(s): DDIMER in the last 168 hours.   Radiology    DG Chest Port 1 View  Result Date: 11/24/2020 CLINICAL DATA:  Status post CABG. EXAM: PORTABLE CHEST 1 VIEW COMPARISON:  Chest x-ray from yesterday. FINDINGS: Interval removal of the endotracheal and enteric tubes. Unchanged right internal jugular sheath. Unchanged mediastinal drain and left chest tube. Stable cardiomegaly status post CABG. Continued low lung volumes with left perihilar and bibasilar atelectasis. No pneumothorax or significant pleural effusion. No acute osseous abnormality. IMPRESSION: 1. Interval removal of the endotracheal and enteric tubes. 2. Continued low lung volumes with bibasilar atelectasis. Electronically Signed   By: Titus Dubin M.D.   On: 11/24/2020 09:02   DG Chest Port 1 View  Result Date: 11/23/2020 CLINICAL DATA:  Coronary bypass EXAM: PORTABLE CHEST 1 VIEW COMPARISON:  11/22/2020 FINDINGS: Endotracheal tube 4.4 cm above the  carina. Right IJ approach central line tip innominate SVC junction. Right base chest tube noted. Mediastinal drain projects over the chest. Patient is status post sternotomy for coronary bypass. Overall low lung volumes with perihilar and bibasilar atelectasis. Negative for edema. No large effusion or pneumothorax. Heart is enlarged. IMPRESSION: Low lung volumes and scattered atelectasis. Expected postoperative findings. No large effusion or pneumothorax. Electronically Signed   By: Jerilynn Mages.  Shick M.D.   On: 11/23/2020 13:33   ECHO INTRAOPERATIVE TEE  Result Date: 11/23/2020  *INTRAOPERATIVE TRANSESOPHAGEAL REPORT *  Patient Name:   JARMAINE EHRLER Date of Exam: 11/23/2020 Medical Rec #:  [id number]          Height:       70.0 in Accession #:    [id number]         Weight:       249.4 lb Date of Birth:  [birth year]         BSA:          2.29 m Patient Age:    75 years           BP:           142/81 mmHg Patient Gender: M                  HR:           87 bpm. Exam Location:  Inpatient Transesophogeal exam was perform intraoperatively during surgical procedure. Patient was closely monitored under general anesthesia during the entirety of examination. Indications:     coronary artery bypass surgery Performing Phys: Suella Broad MD Diagnosing Phys: Suella Broad MD Complications: No known complications during this procedure. POST-OP IMPRESSIONS    s/p CABG x 4 _ Left Ventricle: LVEF improved. CO > 5 L/min, CI > 2.5 L/min/m. No RWMA's noted. _ Right Ventricle: The right ventricle appears unchanged from pre-bypass. _ Aorta: Aorta unchanged, no dissection noted after removal of the cannula. _ Left Atrium: The left atrium appears unchanged from pre-bypass. _ Left Atrial Appendage: The left atrial appendage appears unchanged from pre-bypass. _ Aortic Valve: The aortic valve appears unchanged from pre-bypass. _ Mitral Valve: The mitral valve appears unchanged from pre-bypass. _ Tricuspid Valve: The tricuspid valve appears  unchanged from pre-bypass. _ Pulmonic Valve: The pulmonic valve appears unchanged from pre-bypass. _ Interatrial Septum: The interatrial septum appears unchanged from pre-bypass. _ Interventricular Septum: The interventricular septum appears unchanged from pre-bypass. _ Pericardium: The pericardium  appears unchanged from pre-bypass. PRE-OP FINDINGS  Left Ventricle: The left ventricle has mild-moderately reduced systolic function, with an ejection fraction of 40-45%. The cavity size was normal. Left ventrical global hypokinesis without regional wall motion abnormalities. There is no left ventricular  hypertrophy. Left ventricular diastolic function could not be evaluated. Right Ventricle: The right ventricle has slightly reduced systolic function. The cavity was normal. There is no increase in right ventricular wall thickness. There is no aneurysm seen. Left Atrium: Left atrial size was normal in size. No left atrial/left atrial appendage thrombus was detected. The left atrial appendage is well visualized and there is no evidence of thrombus present. Left atrial appendage velocity is normal at greater than 40 cm/s. Right Atrium: Right atrial size was normal in size. Interatrial Septum: No atrial level shunt detected by color flow Doppler. There is no evidence of a patent foramen ovale. Pericardium: There is no evidence of pericardial effusion. There is no pleural effusion. Mitral Valve: The mitral valve is normal in structure with annular calcification noted. Mitral valve regurgitation is mild by color flow Doppler. The MR jet is centrally-directed. There is no evidence of mitral valve vegetation. There is no evidence of mitral stenosis. Tricuspid Valve: The tricuspid valve was normal in structure. Tricuspid valve regurgitation was not visualized by color flow Doppler. No evidence of tricuspid stenosis is present. There is no evidence of tricuspid valve vegetation. Aortic Valve: The aortic valve is tricuspid Aortic  valve regurgitation was not visualized by color flow Doppler. There is no stenosis of the aortic valve. There is no evidence of aortic valve vegetation. Pulmonic Valve: The pulmonic valve was normal in structure No evidence of pumonic stenosis. Pulmonic valve regurgitation is trivial by color flow Doppler. Aorta: The aortic root, ascending aorta and aortic arch are normal in size and structure. Pulmonary Artery: The pulmonary artery is of normal size. Venous: The inferior vena cava was not well visualized. Shunts: There is no evidence of an atrial septal defect. +--------------+-------++ LEFT VENTRICLE        +--------------+-------++ PLAX 2D               +--------------+-------++ LVIDd:        5.47 cm +--------------+-------++ LVIDs:        3.63 cm +--------------+-------++ LV SV:        90 ml   +--------------+-------++ LV SV Index:  37.29   +--------------+-------++                       +--------------+-------++ +--------------+-----------++ MITRAL VALVE              +--------------+-----------++ MV Area (PHT):1.86 cm    +--------------+-----------++ MV PHT:       118.00 msec +--------------+-----------++  Suella Broad MD Electronically signed by Suella Broad MD Signature Date/Time: 11/23/2020/5:11:38 PM    Final    VAS US DOPPLER PRE CABG  Result Date: 11/22/2020 PREOPERATIVE VASCULAR EVALUATION Patient Name:  Tobias Avitabile  Date of Exam:   11/22/2020 Medical Rec #: [id number]           Accession #:    [id number] Date of Birth: 1945-06-29          Patient Gender: M Patient Age:   73 years Exam [facility]  [facility] Procedure:      VAS US DOPPLER PRE CABG Referring Phys: HARRELL LIGHTFOOT --------------------------------------------------------------------------------  Indications:      Pre-CABG. Risk Factors:     None. Comparison Study: No  prior studies. Performing Technologist: Carlos Levering RVT  Examination Guidelines: A complete evaluation includes  B-mode imaging, spectral Doppler, color Doppler, and power Doppler as needed of all accessible portions of each vessel. Bilateral testing is considered an integral part of a complete examination. Limited examinations for reoccurring indications may be performed as noted.  Right Carotid Findings: +----------+--------+--------+--------+-----------------------+--------+           PSV cm/sEDV cm/sStenosisDescribe               Comments +----------+--------+--------+--------+-----------------------+--------+ CCA Prox  60      10              smooth and heterogenous         +----------+--------+--------+--------+-----------------------+--------+ CCA Distal39      7               smooth and heterogenous         +----------+--------+--------+--------+-----------------------+--------+ ICA Prox  47      12              smooth and heterogenous         +----------+--------+--------+--------+-----------------------+--------+ ICA Distal76      19                                     tortuous +----------+--------+--------+--------+-----------------------+--------+ ECA       91      12                                              +----------+--------+--------+--------+-----------------------+--------+ +----------+--------+-------+--------+------------+           PSV cm/sEDV cmsDescribeArm Pressure +----------+--------+-------+--------+------------+ Subclavian77                                  +----------+--------+-------+--------+------------+ +---------+--------+--+--------+-+---------+ VertebralPSV cm/s42EDV cm/s7Antegrade +---------+--------+--+--------+-+---------+ Left Carotid Findings: +----------+--------+--------+--------+-----------------------+--------+           PSV cm/sEDV cm/sStenosisDescribe               Comments +----------+--------+--------+--------+-----------------------+--------+ CCA Prox  88      11              smooth and heterogenous          +----------+--------+--------+--------+-----------------------+--------+ CCA Distal57      12              smooth and heterogenous         +----------+--------+--------+--------+-----------------------+--------+ ICA Prox  44      12              smooth and heterogenous         +----------+--------+--------+--------+-----------------------+--------+ ICA Distal66      17                                     tortuous +----------+--------+--------+--------+-----------------------+--------+ ECA       98      11                                              +----------+--------+--------+--------+-----------------------+--------+ +----------+--------+--------+--------+------------+ SubclavianPSV cm/sEDV  cm/sDescribeArm Pressure +----------+--------+--------+--------+------------+           156                                  +----------+--------+--------+--------+------------+ +---------+--------+--+--------+--+---------+ VertebralPSV cm/s51EDV cm/s14Antegrade +---------+--------+--+--------+--+---------+  ABI Findings: +--------+------------------+-----+---------+--------+ Right   Rt Pressure (mmHg)IndexWaveform Comment  +--------+------------------+-----+---------+--------+ [id number]                    triphasic         +--------+------------------+-----+---------+--------+ PTA     189               1.21 triphasic         +--------+------------------+-----+---------+--------+ DP      178               1.14 triphasic         +--------+------------------+-----+---------+--------+ +--------+------------------+-----+---------+-------+ Left    Lt Pressure (mmHg)IndexWaveform Comment +--------+------------------+-----+---------+-------+ [id number]                    triphasic        +--------+------------------+-----+---------+-------+ PTA     183               1.17 triphasic         +--------+------------------+-----+---------+-------+ DP      174               1.12 triphasic        +--------+------------------+-----+---------+-------+ +-------+---------------+----------------+ ABI/TBIToday's ABI/TBIPrevious ABI/TBI +-------+---------------+----------------+ Right  1.21                            +-------+---------------+----------------+ Left   1.17                            +-------+---------------+----------------+  Right Doppler Findings: +--------+--------+-----+---------+--------+ Site    PressureIndexDoppler  Comments +--------+--------+-----+---------+--------+ [id number]          triphasic         +--------+--------+-----+---------+--------+ Radial               triphasic         +--------+--------+-----+---------+--------+ Ulnar                triphasic         +--------+--------+-----+---------+--------+  Left Doppler Findings: +--------+--------+-----+---------+--------+ Site    PressureIndexDoppler  Comments +--------+--------+-----+---------+--------+ [id number]          triphasic         +--------+--------+-----+---------+--------+ Radial               triphasic         +--------+--------+-----+---------+--------+ Ulnar                triphasic         +--------+--------+-----+---------+--------+  Summary: Right Carotid: Velocities in the right ICA are consistent with a 1-39% stenosis. Left Carotid: Velocities in the left ICA are consistent with a 1-39% stenosis. Vertebrals: Bilateral vertebral arteries demonstrate antegrade flow. Right ABI: Resting right ankle-brachial index is within normal range. No evidence of significant right lower extremity arterial disease. Left ABI: Resting left ankle-brachial index is within normal range. No evidence of significant left lower extremity arterial disease. Right Upper Extremity: Doppler waveform obliterate with right radial compression. Doppler waveforms remain within  normal limits with right ulnar compression. Left Upper Extremity: Doppler waveform obliterate with left radial compression.  Doppler waveform obliterate with left ulnar compression.  Electronically signed by Deitra Mayo MD on 11/22/2020 at 12:17:14 PM.    Final      Patient Profile     75 y.o. male with CAD, HTN, HLD, elevated blood sugar and CKD stage 3 admitted with chest pain, troponin elevation c/w a NSTEMI.  Patient taken for CABG 11/23/2020.  Assessment & Plan    1.  Non-STEMI: Three-vessel CAD treated with multivessel CABG 11/23/2020, doing very well postoperative day #1.  Continue aspirin and low-dose metoprolol, high intensity statin drug. 2.  Heart failure with reduced ejection fraction: Optimize medical therapy as he progresses from surgery.  Currently on low-dose beta-blockade.  Hold ACE/ARB in early postop period with baseline CKD 3. 3.  CKD 3: Stable creatinine, 1.6 this morning.  Avoid nephrotoxic drugs, optimize volume.  Overall patient is doing very well postoperative day #1 from multivessel CABG.  Management per surgical team.  Heart rhythm stable.  On appropriate medical therapy with aspirin, beta-blocker, high intensity statin drug.    For questions or updates, please contact [place] Please consult [email or web address] for contact info under        Signed, Sherren Mocha, MD  11/24/2020, 10:15 AM

## 2020-11-24 NOTE — Progress Notes (Signed)
Patient ID: Corey Woods, male   DOB: February 06, 1945, 75 y.o.   MRN: [id number]  TCTS Evening Rounds:  Hemodynamically stable in sinus rhythm.   Diuresing well but creat up a little higher tonight.  BMET    Component Value Date/Time   NA 132 (L) 11/24/2020 1631   K 4.4 11/24/2020 1631   CL 100 11/24/2020 1631   CO2 25 11/24/2020 1631   GLUCOSE 153 (H) 11/24/2020 1631   BUN 31 (H) 11/24/2020 1631   CREATININE 1.85 (H) 11/24/2020 1631   CALCIUM 8.5 (L) 11/24/2020 1631   GFRNONAA 38 (L) 11/24/2020 1631

## 2020-11-24 NOTE — Progress Notes (Signed)
NAME:  Corey Woods, MRN:  [id number], DOB:  [birth year], LOS: 6 ADMISSION DATE:  11/18/2020, CONSULTATION DATE:  11/23 REFERRING MD:  [name], CHIEF COMPLAINT:  post CABG   History of Present Illness:  Patient is a 75 yo M with pertinent PMH of CAD, HLD, HTN presents to [facility] on 11/18 with N STEMI.  On 11/18, patient admitted to [facility] with Chest pain. EKG showed NSTEMI. Troponins elevated. Transferred to [facility]. Patient's left heart cath showed severe 3 vessel coronary disease.  Echo showed LVEF of 40 to 45%, with grade 1 diastolic dysfunction.  TCTS consulted and scheduled patient for CABG on 11/23.  Patient has been treated with IV heparin, aspirin, statin, metoprolol.  On 11/23, patient received CABG x4.  Patient tolerated procedure well and sent to cardiac ICU.  Patient remains intubated and on pressors.  Currently being weaned off per protocol.  PCCM consulted for medical management.  Pertinent  Medical History   Past Medical History:  Diagnosis Date   Asthmatic bronchitis    Benign bladder tumor    CAD (coronary artery disease) 2002   BMS x 2 and DES - [place] (multivessel)   Chronic renal failure, stage 3 (moderate), unspecified whether stage 3a or 3b CKD ([place])    Depression    MI (myocardial infarction) ([place]) 2002   Orthostatic hypotension 2002   Prediabetes    Statin intolerance    Crestor - myalgias    Significant Hospital Events: Including procedures, antibiotic start and stop dates in addition to other pertinent events   11/23: patient returned post CABG intubated around 1300 to ICU; weaning per protocol  Interim History / Subjective:   Comfortable post extubation.   Objective   Blood pressure (!) 123/55, pulse 77, temperature 98.6 F (37 C), temperature source Oral, resp. rate (!) 28, height 5\' 10"  (1.778 m), weight 118.7 kg, SpO2 93 %. CVP:  [1 mmHg-23 mmHg] 10 mmHg  Vent Mode: PSV;CPAP FiO2 (%):  [40 %-50 %] 40 % Set Rate:  [4 bmp-12 bmp] 4  bmp Vt Set:  [580 mL] 580 mL PEEP:  [5 cmH20] 5 cmH20 Pressure Support:  [10 cmH20] 10 cmH20 Plateau Pressure:  [21 cmH20] 21 cmH20   Intake/Output Summary (Last 24 hours) at 11/24/2020 0755 Last data filed at 11/24/2020 0700 Gross per 24 hour  Intake 7029.42 ml  Output 3333 ml  Net 3696.42 ml   Filed Weights   11/21/20 0542 11/22/20 0440 11/24/20 0500  Weight: 114.3 kg 113.1 kg 118.7 kg    Examination: General:  elderly male, in chair, comfortable  HEENT: tracking appropriately  Neuro: awake, alert  CV: rrr, S1 s2  PULM:  diminished bilaterally  GI: soft, nt nd  Extremities: no edema  Skin: no rash   Labs: ABG 7.33, 37.5, 69, 20.1 Hgb 12.2  Resolved Hospital Problem list     Assessment & Plan:   CABG x 4 CAD/Non-STEMI HFrEF: Echo showed LVEF of 40 to 51%, grade 1 diastolic dysfunction P: - off drips  - tolerating PO - PRN oxy for pain  - hopefully up walking today  - IS and flutter  - asa per tcts  - mediastinal drains per TCTS   Post Op Respiratory Failure requiring mechanical ventilation P: - weaned succesfully from vent  - continue IS and flutter   HTN HLD P: - statin   Prediabetes P: - SSI with cbg   CKD III P: - follow uop and avoid nephrotoxic agents  Asthmatic bronchitis P: - singulair and prn albuterol   Depression P: -consider restarting home meds tomorrow: wellbutrin, vraylar, cymbalta  Benign bladder tumor/ BPH  P: -consider restarting home tamsulosin tomorrow  Best Practice (right click and "Reselect all SmartList Selections" daily)   Diet/type: NPO DVT prophylaxis: SCD GI prophylaxis: H2B and PPI Lines: Central line and Arterial Line Foley:  Yes, and it is still needed Code Status:  full code Last date of multidisciplinary goals of care discussion pending  Labs   CBC: Recent Labs  Lab 11/22/20 0547 11/23/20 0441 11/23/20 0808 11/23/20 1101 11/23/20 1109 11/23/20 1310 11/23/20 1313 11/23/20 1631  11/23/20 1810 11/23/20 1917 11/23/20 1925 11/24/20 0350  WBC 7.0 7.2  --   --   --  9.9  --   --   --  10.6*  --  12.0*  HGB 16.4 15.6   < > 11.8*   < > 12.2*   < > 10.5* 11.2* 12.6* 10.9* 12.6*  HCT 47.8 45.1   < > 33.7*   < > 36.0*   < > 31.0* 33.0* 36.0* 32.0* 36.8*  MCV 89.5 89.7  --   --   --  90.2  --   --   --  89.3  --  90.4  PLT 193 169  --  168  --  133*  --   --   --  158  --  155   < > = values in this interval not displayed.    Basic Metabolic Panel: Recent Labs  Lab 11/18/20 1319 11/19/20 0021 11/21/20 0136 11/23/20 0441 11/23/20 0808 11/23/20 1041 11/23/20 1109 11/23/20 1137 11/23/20 1201 11/23/20 1204 11/23/20 1313 11/23/20 1631 11/23/20 1810 11/23/20 1917 11/23/20 1925 11/24/20 0350  NA  --  135 136 135   < > 134*   < > 134*   < > 135   < > 139 139 134* 138 134*  K  --  3.6 4.2 4.1   < > 5.0   < > 5.2*   < > 4.6   < > 4.4 4.5 4.7 4.3 4.5  CL  --  99 99 102   < > 101  --  102  --  103  --   --   --  104  --  107  CO2  --  27 29 27   --   --   --   --   --   --   --   --   --  23  --  20*  GLUCOSE  --  218* 151* 115*   < > 137*  --  164*  --  161*  --   --   --  146*  --  107*  BUN  --  20 24* 25*   < > 29*  --  31*  --  29*  --   --   --  29*  --  29*  CREATININE  --  1.48* 1.53* 1.64*   < > 1.50*  --  1.60*  --  1.40*  --   --   --  1.55*  --  1.61*  CALCIUM  --  9.3 9.3 9.4  --   --   --   --   --   --   --   --   --  8.1*  --  7.8*  MG 2.0  --   --   --   --   --   --   --   --   --   --   --   --  3.2*  --  2.4   < > = values in this interval not displayed.   GFR: Estimated Creatinine Clearance: 52.8 mL/min (A) (by C-G formula based on SCr of 1.61 mg/dL (H)). Recent Labs  Lab 11/23/20 0441 11/23/20 1310 11/23/20 1917 11/24/20 0350  WBC 7.2 9.9 10.6* 12.0*    Liver Function Tests: Recent Labs  Lab 11/18/20 1319 11/23/20 0441 11/24/20 0350  AST 24 104* 68*  ALT 31 120* 61*  ALKPHOS 59 62 37*  BILITOT 1.1 1.5* 1.7*  PROT 6.3* 5.6* 4.9*   ALBUMIN 3.6 3.6 3.1*   Recent Labs  Lab 11/18/20 1319  LIPASE 28   No results for input(s): AMMONIA in the last 168 hours.  ABG    Component Value Date/Time   PHART 7.314 (L) 11/23/2020 1925   PCO2ART 41.7 11/23/2020 1925   PO2ART 85 11/23/2020 1925   HCO3 21.1 11/23/2020 1925   TCO2 22 11/23/2020 1925   ACIDBASEDEF 5.0 (H) 11/23/2020 1925   O2SAT 95.0 11/23/2020 1925     Coagulation Profile: Recent Labs  Lab 11/22/20 0547 11/23/20 1310  INR 1.0 1.3*    Cardiac Enzymes: No results for input(s): CKTOTAL, CKMB, CKMBINDEX, TROPONINI in the last 168 hours.  HbA1C: Hgb A1c MFr Bld  Date/Time Value Ref Range Status  11/19/2020 12:21 AM 6.8 (H) 4.8 - 5.6 % Final    Comment:    (NOTE) Pre diabetes:          5.7%-6.4%  Diabetes:              >6.4%  Glycemic control for   <7.0% adults with diabetes     CBG: Recent Labs  Lab 11/23/20 2042 11/23/20 2159 11/24/20 0012 11/24/20 0350 11/24/20 0736  GLUCAP 116* 118* 110* 122* 153*    Review of Systems:   Unable to obtain; patient intubated/sedated.  Past Medical History:  He,  has a past medical history of Asthmatic bronchitis, Benign bladder tumor, CAD (coronary artery disease) (2002), Chronic renal failure, stage 3 (moderate), unspecified whether stage 3a or 3b CKD ([place]), Depression, MI (myocardial infarction) ([place]) (2002), Orthostatic hypotension (2002), Prediabetes, and Statin intolerance.   Surgical History:   Past Surgical History:  Procedure Laterality Date   CORONARY ANGIOPLASTY WITH STENT PLACEMENT     FOOT SURGERY     LEFT HEART CATH AND CORONARY ANGIOGRAPHY N/A 11/21/2020   Procedure: LEFT HEART CATH AND CORONARY ANGIOGRAPHY;  Surgeon: Martinique, Peter M, MD;  Location: [facility];  Service: Cardiovascular;  Laterality: N/A;   TONSILLECTOMY       Social History:   reports that he quit smoking about 20 years ago. His smoking use included cigarettes. He has never used smokeless tobacco. He  reports current alcohol use. He reports that he does not use drugs.   Family History:  His family history includes Heart disease in his father; Leukemia in his mother; Stroke in his father.   Allergies Allergies  Allergen Reactions   Ciprofloxacin      Home Medications  Prior to Admission medications   Medication Sig Start Date End Date Taking? Authorizing Provider  albuterol (VENTOLIN HFA) 108 (90 Base) MCG/ACT inhaler albuterol sulfate HFA 90 mcg/actuation aerosol inhaler  Inhale 2 puffs every 4 hours by inhalation route as needed.   Yes [provider]  buPROPion (WELLBUTRIN XL) 300 MG 24 hr tablet Take 300 mg by mouth daily.   Yes [provider]  cariprazine (VRAYLAR) 1.5 MG capsule Take 1.5 mg  by mouth daily.   Yes [provider]  DULoxetine (CYMBALTA) 60 MG capsule Take 120 mg by mouth daily.   Yes [provider]  montelukast (SINGULAIR) 10 MG tablet Take 10 mg by mouth at bedtime.   Yes [provider]  tamsulosin (FLOMAX) 0.4 MG CAPS capsule Take 0.4 mg by mouth daily.   Yes [provider]  TESTOSTERONE AQUEOUS IM Inject 400 mg into the muscle See admin instructions. 400 mg once a week every Friday   Yes [provider]     CCM will sign off. Please call us if needed.   Garner Nash, DO [facility] 11/24/2020 7:56 AM

## 2020-11-25 ENCOUNTER — Telehealth: Payer: Self-pay | Admitting: Physician Assistant

## 2020-11-25 ENCOUNTER — Inpatient Hospital Stay (HOSPITAL_COMMUNITY): Payer: Medicare Other

## 2020-11-25 DIAGNOSIS — I214 Non-ST elevation (NSTEMI) myocardial infarction: Secondary | ICD-10-CM | POA: Diagnosis not present

## 2020-11-25 LAB — BASIC METABOLIC PANEL
Anion gap: 8 (ref 5–15)
BUN: 31 mg/dL — ABNORMAL HIGH (ref 8–23)
CO2: 25 mmol/L (ref 22–32)
Calcium: 8.5 mg/dL — ABNORMAL LOW (ref 8.9–10.3)
Chloride: 98 mmol/L (ref 98–111)
Creatinine, Ser: 1.8 mg/dL — ABNORMAL HIGH (ref 0.61–1.24)
GFR, Estimated: 39 mL/min — ABNORMAL LOW (ref >60)
Glucose, Bld: 130 mg/dL — ABNORMAL HIGH (ref 70–99)
Potassium: 4.1 mmol/L (ref 3.5–5.1)
Sodium: 131 mmol/L — ABNORMAL LOW (ref 135–145)

## 2020-11-25 LAB — CBC
HCT: 37.5 % — ABNORMAL LOW (ref 39.0–52.0)
Hemoglobin: 12.5 g/dL — ABNORMAL LOW (ref 13.0–17.0)
MCH: 30.7 pg (ref 26.0–34.0)
MCHC: 33.3 g/dL (ref 30.0–36.0)
MCV: 92.1 fL (ref 80.0–100.0)
Platelets: 146 10*3/uL — ABNORMAL LOW (ref 150–400)
RBC: 4.07 MIL/uL — ABNORMAL LOW (ref 4.22–5.81)
RDW: 13.3 % (ref 11.5–15.5)
WBC: 11.5 10*3/uL — ABNORMAL HIGH (ref 4.0–10.5)
nRBC: 0 % (ref 0.0–0.2)

## 2020-11-25 LAB — GLUCOSE, CAPILLARY
Glucose-Capillary: 111 mg/dL — ABNORMAL HIGH (ref 70–99)
Glucose-Capillary: 124 mg/dL — ABNORMAL HIGH (ref 70–99)
Glucose-Capillary: 145 mg/dL — ABNORMAL HIGH (ref 70–99)
Glucose-Capillary: 155 mg/dL — ABNORMAL HIGH (ref 70–99)
Glucose-Capillary: 163 mg/dL — ABNORMAL HIGH (ref 70–99)
Glucose-Capillary: 165 mg/dL — ABNORMAL HIGH (ref 70–99)
Glucose-Capillary: 173 mg/dL — ABNORMAL HIGH (ref 70–99)

## 2020-11-25 LAB — HEPATIC FUNCTION PANEL
ALT: 41 U/L (ref 0–44)
AST: 38 U/L (ref 15–41)
Albumin: 3.2 g/dL — ABNORMAL LOW (ref 3.5–5.0)
Alkaline Phosphatase: 44 U/L (ref 38–126)
Bilirubin, Direct: 0.3 mg/dL — ABNORMAL HIGH (ref 0.0–0.2)
Indirect Bilirubin: 1.5 mg/dL — ABNORMAL HIGH (ref 0.3–0.9)
Total Bilirubin: 1.8 mg/dL — ABNORMAL HIGH (ref 0.3–1.2)
Total Protein: 5.6 g/dL — ABNORMAL LOW (ref 6.5–8.1)

## 2020-11-25 MED ORDER — POTASSIUM CHLORIDE CRYS ER 20 MEQ PO TBCR
40.0000 meq | EXTENDED_RELEASE_TABLET | Freq: Two times a day (BID) | ORAL | Status: DC
  Administered 2020-11-25 – 2020-11-27 (×5): 40 meq via ORAL
  Filled 2020-11-25 (×5): qty 2

## 2020-11-25 MED ORDER — METOPROLOL TARTRATE 25 MG PO TABS
25.0000 mg | ORAL_TABLET | Freq: Two times a day (BID) | ORAL | Status: DC
  Administered 2020-11-25 – 2020-11-26 (×3): 25 mg via ORAL
  Filled 2020-11-25 (×3): qty 1

## 2020-11-25 MED ORDER — SODIUM CHLORIDE 0.9% FLUSH
3.0000 mL | Freq: Two times a day (BID) | INTRAVENOUS | Status: DC
  Administered 2020-11-25 – 2020-11-29 (×8): 3 mL via INTRAVENOUS

## 2020-11-25 MED ORDER — ~~LOC~~ CARDIAC SURGERY, PATIENT & FAMILY EDUCATION: Freq: Once | Status: AC

## 2020-11-25 MED ORDER — SODIUM CHLORIDE 0.9 % IV SOLN: 250.0000 mL | INTRAVENOUS | Status: DC | PRN

## 2020-11-25 MED ORDER — SODIUM CHLORIDE 0.9% FLUSH: 3.0000 mL | INTRAVENOUS | Status: DC | PRN

## 2020-11-25 MED ORDER — FUROSEMIDE 40 MG PO TABS
40.0000 mg | ORAL_TABLET | Freq: Every day | ORAL | Status: DC
  Administered 2020-11-25 – 2020-11-26 (×2): 40 mg via ORAL
  Filled 2020-11-25 (×2): qty 1

## 2020-11-25 NOTE — Discharge Summary (Signed)
[address]       [place] [address]             [phone]    Physician Discharge Summary  Patient ID: Corey Woods MRN: [id number] DOB/AGE: March 23, 1945 75 y.o.  Admit date: 11/18/2020 Discharge date: 12/01/2020  Admission Diagnoses:  Patient Active Problem List   Diagnosis Date Noted   S/P CABG x 4 11/23/2020   Coronary artery disease with hx of myocardial infarct w/o hx of CABG    Prediabetes    Primary pulmonary hypertension ([place])    Hyperlipidemia    Respiratory failure, post-operative (HCC)    NSTEMI (non-ST elevated myocardial infarction) ([place]) 11/18/2020     Discharge Diagnoses:  Patient Active Problem List   Diagnosis Date Noted   S/P CABG x 4 11/23/2020   Coronary artery disease with hx of myocardial infarct w/o hx of CABG    Prediabetes    Primary pulmonary hypertension ([place])    Hyperlipidemia    Respiratory failure, post-operative (HCC)    NSTEMI (non-ST elevated myocardial infarction) ([place]) 11/18/2020     Discharged Condition: good  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  He also has a known history of coronary artery disease having a heart attack in 2002 and subsequent percutaneous interventions.  He is unsure about which vessels were treated and what procedures were used.  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  He reported having chest pain off and on for the past few months but was awakened from sleep on 11/19/2020 with chest pain that was protracted.  In emergency room, EKG showed sinus rhythm with a few PVCs.  There was evidence of a septal infarct, age undetermined.  Initial troponin was 44 and a subsequent lab a few hours later was 180.  Having ruled in for  non-ST elevation myocardial infarction, cardiology consult was requested.  Patient was seen by [name] and admitted to the hospital.  He was started on heparin infusion.  He was transferred to [facility] for further evaluation.  He remained stable with no further chest pain.  Left heart catheterization was conducted earlier today and demonstrates severe three-vessel coronary artery disease. CT surgery has been asked to evaluate [name] for consideration of coronary bypass grafting. [name] is resting in bed following his left heart catheterization earlier today.  He tells me he has had persistent cough and prior to the admission but has not had any coughing in the last 24 hours.  He denies having any lower extremity varicosities or symptoms of claudication.  He has no current dental issues.  His last dental visit was in the past 6 months.  The patient and all relevant studies were reviewed by [name] who recommended proceeding with coronary artery surgical revascularization.  Hospital course patient remained medically stable to proceed and on 11/23/2020 he was taken the operating room at which time he underwent the following procedure: Coronary artery bypass grafting x4.  He tolerated the procedure well and was taken to the surgical intensive care unit in stable condition.  Postoperative hospital course:  Patient is doing well.  He was weaned from the ventilator using standard post cardiac surgical protocols without difficulty.   His chest  tubes were removed on postoperative day #1.  He does have some expected post operative volume overload requiring diuretics.  Blood sugars have been managed with standard protocols.  He was on no preoperative diabetic medications with a hemoglobin A1c of 6.8.  He will need close follow-up as an outpatient.  He has been started on beta-blocker/statin/aspirin.  Epicardial pacer wires were removed on postop day #2.    He was transferred to the  telemetry unit on postoperative day #2 for further rehab and pulmonary hygiene;however a bed was not available until 11/26. Events of 11/26 and earlier this am noted. Patient felt lousy earlier this am as he "broke out in a sweat" and per nurse report, he looked "mottled". He denied chest pain or shortness of breath. He states after given Lasix he "peed a lot" his breathing has improved. He may have had flash pulmonary edema. Plavix was also started due to NSTEMI and ec asa was decreased to 81 mg daily. He was still volume overloaded and diuresed accordingly. Of note, his HGA1C is 6.8. He has a history of pre diabetes. He will need close medical surveillance as an outpatient after discharge. He will be provided nutritional information with discharge paperwork. On POD 5 he had episodes of atrial fibrillation and was started on oral amiodarone and has converted to sinus rhythm.  As he was placed on amiodarone TSH was obtained and noted to be elevated at 7.4.  He has no history of hypothyroidism.  A free T4 has been ordered and result is normal.   He has diuresed well but creatinine has been somewhat variable.  It typically ranges in the 1.4-1.8 range.  Most recent value is1.67.  Weight is stable and he has not required any further diuretics. Incisions are healing well. Oxygen has been weaned off. He is tolerating routine rehab activities. At the time of discharge, the patient was felt to be quite stable.  Consults: cardiology  Significant Diagnostic Studies:  DG Chest 2 View  Result Date: 11/28/2020 CLINICAL DATA:  Pleural effusion EXAM: CHEST - 2 VIEW COMPARISON:  11/27/2020 FINDINGS: Coronary bypass changes noted. Stable cardiac enlargement and similar bibasilar atelectasis, worse on the left. Trace left effusion suspected. No right pleural effusion. Negative for pneumothorax. Trachea midline. IMPRESSION: Stable cardiomegaly without CHF. Minor persistent basilar atelectasis and trace left effusion.  Electronically Signed   By: Jerilynn Mages.  Shick M.D.   On: 11/28/2020 08:10   DG Chest 2 View  Result Date: 11/22/2020 CLINICAL DATA:  Preoperative study for CABG. EXAM: CHEST - 2 VIEW COMPARISON:  Chest x-ray 11/15/2020. FINDINGS: Mediastinum hilar structures normal. Heart size normal. Low lung volumes. Mild left base pleural-parenchymal thickening consistent scarring again noted. No acute infiltrate. No pleural effusion or pneumothorax. Degenerative changes thoracic spine. IMPRESSION: Low lung volumes. Mild left base pleural-parenchymal thickening consistent with scarring again noted. No acute cardiopulmonary disease identified. Electronically Signed   By: Marcello Moores  Register M.D.   On: 11/22/2020 07:49   DG Chest 2 View  Result Date: 11/16/2020 CLINICAL DATA:  Dyspnea with activity worsening over the last few weeks. EXAM: CHEST - 2 VIEW COMPARISON:  None. FINDINGS: Mild left basilar atelectasis. No focal consolidation. No pleural effusion or pneumothorax. Heart and mediastinal contours are unremarkable. No acute osseous abnormality. IMPRESSION: No active cardiopulmonary disease. Electronically Signed   By: Kathreen Devoid M.D.   On: 11/16/2020 11:19   CARDIAC CATHETERIZATION  Result Date: 11/21/2020   Prox LAD to Mid LAD lesion is 95% stenosed.  Mid LAD lesion is 50% stenosed.   Dist LAD lesion is 80% stenosed.   1st Diag lesion is 90% stenosed.   Mid Cx to Dist Cx lesion is 80% stenosed.   2nd Mrg lesion is 75% stenosed.   3rd Mrg lesion is 75% stenosed.   Prox RCA to Mid RCA lesion is 90% stenosed.   Mid RCA to Dist RCA lesion is 100% stenosed.   LV end diastolic pressure is normal. Severe 3 vessel obstructive CAD Normal LVEDP Plan: CT surgery consult for CABG   DG Chest Port 1 View  Result Date: 11/27/2020 CLINICAL DATA:  Acute heart failure. EXAM: PORTABLE CHEST 1 VIEW COMPARISON:  November 25, 2020 FINDINGS: Multiple sternal wires are seen. The right-sided venous catheter noted on the prior study has  been removed. Mild right perihilar linear atelectasis is seen. There is no evidence of a pleural effusion or pneumothorax. The cardiac silhouette is mildly enlarged and unchanged in size. Degenerative changes are seen throughout the thoracic spine. IMPRESSION: 1. Evidence of prior median sternotomy/CABG. 2. Mild right perihilar linear atelectasis. Electronically Signed   By: Virgina Norfolk M.D.   On: 11/27/2020 00:16   DG Chest Port 1 View  Result Date: 11/25/2020 CLINICAL DATA:  Shortness of breath, chest pain EXAM: PORTABLE CHEST 1 VIEW COMPARISON:  11/24/2020 FINDINGS: Prior CABG. Cardiomegaly. Right central line remains in place, unchanged. Left base atelectasis. Mild vascular congestion. Low lung volumes. No pneumothorax. IMPRESSION: Low lung volumes with left base atelectasis and mild vascular congestion. Electronically Signed   By: Rolm Baptise M.D.   On: 11/25/2020 08:11   DG Chest Port 1 View  Result Date: 11/24/2020 CLINICAL DATA:  Status post CABG. EXAM: PORTABLE CHEST 1 VIEW COMPARISON:  Chest x-ray from yesterday. FINDINGS: Interval removal of the endotracheal and enteric tubes. Unchanged right internal jugular sheath. Unchanged mediastinal drain and left chest tube. Stable cardiomegaly status post CABG. Continued low lung volumes with left perihilar and bibasilar atelectasis. No pneumothorax or significant pleural effusion. No acute osseous abnormality. IMPRESSION: 1. Interval removal of the endotracheal and enteric tubes. 2. Continued low lung volumes with bibasilar atelectasis. Electronically Signed   By: Titus Dubin M.D.   On: 11/24/2020 09:02   DG Chest Port 1 View  Result Date: 11/23/2020 CLINICAL DATA:  Coronary bypass EXAM: PORTABLE CHEST 1 VIEW COMPARISON:  11/22/2020 FINDINGS: Endotracheal tube 4.4 cm above the carina. Right IJ approach central line tip innominate SVC junction. Right base chest tube noted. Mediastinal drain projects over the chest. Patient is status post  sternotomy for coronary bypass. Overall low lung volumes with perihilar and bibasilar atelectasis. Negative for edema. No large effusion or pneumothorax. Heart is enlarged. IMPRESSION: Low lung volumes and scattered atelectasis. Expected postoperative findings. No large effusion or pneumothorax. Electronically Signed   By: Jerilynn Mages.  Shick M.D.   On: 11/23/2020 13:33   ECHOCARDIOGRAM COMPLETE  Result Date: 11/19/2020    ECHOCARDIOGRAM REPORT   Patient Name:   UGOCHUKWU CHICHESTER Date of Exam: 11/19/2020 Medical Rec #:  [id number]          Height:       70.0 in Accession #:    [id number]         Weight:       258.1 lb Date of Birth:  [birth year]         BSA:          2.326 m Patient Age:    71 years  BP:           126/68 mmHg Patient Gender: M                  HR:           69 bpm. Exam Location:  Inpatient Procedure: 2D Echo, 3D Echo, Cardiac Doppler, Color Doppler and Intracardiac            Opacification Agent Indications:    R07.9* Chest pain, unspecified; 122-I22.9 Subsequent ST                 elevation (STEM) and non-ST elevation (NSTEMI) myocardial                 infarction  History:        Patient has no prior history of Echocardiogram examinations.                 Acute MI and CAD; Signs/Symptoms:Chest Pain and Hypotension.  Sonographer:    Roseanna Rainbow RDCS Referring Phys: [id number] [facility] [employer]  Sonographer Comments: Technically difficult study due to poor echo windows. Image acquisition challenging due to patient body habitus. IMPRESSIONS  1. Left ventricular ejection fraction, by estimation, is 40 to 45%. The left ventricle has mild to moderately decreased function. The left ventricle has no regional wall motion abnormalities. Left ventricular diastolic parameters are consistent with Grade I diastolic dysfunction (impaired relaxation).  2. Right ventricular systolic function is normal. The right ventricular size is normal.  3. The mitral valve is normal in structure. Mild to moderate mitral valve  regurgitation. No evidence of mitral stenosis.  4. The aortic valve is tricuspid. Aortic valve regurgitation is not visualized. Aortic valve sclerosis is present, with no evidence of aortic valve stenosis.  5. The inferior vena cava is normal in size with greater than 50% respiratory variability, suggesting right atrial pressure of 3 mmHg. FINDINGS  Left Ventricle: Left ventricular ejection fraction, by estimation, is 40 to 45%. The left ventricle has mild to moderately decreased function. The left ventricle has no regional wall motion abnormalities. Definity contrast agent was given IV to delineate the left ventricular endocardial borders. The left ventricular internal cavity size was normal in size. There is borderline left ventricular hypertrophy. Left ventricular diastolic parameters are consistent with Grade I diastolic dysfunction (impaired relaxation). Right Ventricle: The right ventricular size is normal. Right ventricular systolic function is normal. Left Atrium: Left atrial size was normal in size. Right Atrium: Right atrial size was normal in size. Pericardium: There is no evidence of pericardial effusion. Mitral Valve: The mitral valve is normal in structure. Mild mitral annular calcification. Mild to moderate mitral valve regurgitation. No evidence of mitral valve stenosis. Tricuspid Valve: The tricuspid valve is normal in structure. Tricuspid valve regurgitation is trivial. No evidence of tricuspid stenosis. Aortic Valve: The aortic valve is tricuspid. Aortic valve regurgitation is not visualized. Aortic valve sclerosis is present, with no evidence of aortic valve stenosis. Pulmonic Valve: The pulmonic valve was normal in structure. Pulmonic valve regurgitation is trivial. No evidence of pulmonic stenosis. Aorta: The aortic root is normal in size and structure. Venous: The inferior vena cava is normal in size with greater than 50% respiratory variability, suggesting right atrial pressure of 3 mmHg.  IAS/Shunts: No atrial level shunt detected by color flow Doppler.  LEFT VENTRICLE PLAX 2D LVIDd:         5.30 cm      Diastology LVIDs:  4.50 cm      LV e' medial:    5.87 cm/s LV PW:         1.20 cm      LV E/e' medial:  11.3 LV IVS:        1.10 cm      LV e' lateral:   8.16 cm/s LVOT diam:     2.40 cm      LV E/e' lateral: 8.2 LV SV:         95 LV SV Index:   41 LVOT Area:     4.52 cm                              3D Volume EF: LV Volumes (MOD)            3D EF:        40 % LV vol d, MOD A2C: 139.0 ml LV EDV:       147 ml LV vol d, MOD A4C: 176.0 ml LV ESV:       88 ml LV vol s, MOD A2C: 71.0 ml  LV SV:        59 ml LV vol s, MOD A4C: 55.0 ml LV SV MOD A2C:     68.0 ml LV SV MOD A4C:     176.0 ml LV SV MOD BP:      98.3 ml RIGHT VENTRICLE             IVC RV S prime:     11.70 cm/s  IVC diam: 2.00 cm TAPSE (M-mode): 2.0 cm LEFT ATRIUM             Index        RIGHT ATRIUM          Index LA diam:        3.60 cm 1.55 cm/m   RA Area:     9.15 cm LA Vol (A2C):   54.7 ml 23.51 ml/m  RA Volume:   18.30 ml 7.87 ml/m LA Vol (A4C):   22.6 ml 9.72 ml/m LA Biplane Vol: 36.3 ml 15.60 ml/m  AORTIC VALVE             PULMONIC VALVE LVOT Vmax:   94.90 cm/s  PR End Diast Vel: 1.25 msec LVOT Vmean:  65.000 cm/s LVOT VTI:    0.209 m  AORTA Ao Root diam: 3.60 cm Ao Asc diam:  3.40 cm MITRAL VALVE MV Area (PHT): 3.48 cm       SHUNTS MV Decel Time: 218 msec       Systemic VTI:  0.21 m MR Peak grad:    103.6 mmHg   Systemic Diam: 2.40 cm MR Mean grad:    64.0 mmHg MR Vmax:         509.00 cm/s MR Vmean:        376.0 cm/s MR PISA:         1.57 cm MR PISA Eff ROA: 12 mm MR PISA Radius:  0.50 cm MV E velocity: 66.60 cm/s MV A velocity: 96.90 cm/s MV E/A ratio:  0.69 Kirk Ruths MD Electronically signed by Kirk Ruths MD Signature Date/Time: 11/19/2020/2:13:10 PM    Final    ECHO INTRAOPERATIVE TEE  Result Date: 11/23/2020  *INTRAOPERATIVE TRANSESOPHAGEAL REPORT *  Patient Name:   ATTIKUS BARTOSZEK Date of Exam:  11/23/2020 Medical Rec #:  [id number]  Height:       70.0 in Accession #:    [id number]         Weight:       249.4 lb Date of Birth:  01-02-1946         BSA:          2.29 m Patient Age:    49 years           BP:           142/81 mmHg Patient Gender: M                  HR:           87 bpm. Exam Location:  Inpatient Transesophogeal exam was perform intraoperatively during surgical procedure. Patient was closely monitored under general anesthesia during the entirety of examination. Indications:     coronary artery bypass surgery Performing Phys: Suella Broad MD Diagnosing Phys: Suella Broad MD Complications: No known complications during this procedure. POST-OP IMPRESSIONS    s/p CABG x 4 _ Left Ventricle: LVEF improved. CO > 5 L/min, CI > 2.5 L/min/m. No RWMA's noted. _ Right Ventricle: The right ventricle appears unchanged from pre-bypass. _ Aorta: Aorta unchanged, no dissection noted after removal of the cannula. _ Left Atrium: The left atrium appears unchanged from pre-bypass. _ Left Atrial Appendage: The left atrial appendage appears unchanged from pre-bypass. _ Aortic Valve: The aortic valve appears unchanged from pre-bypass. _ Mitral Valve: The mitral valve appears unchanged from pre-bypass. _ Tricuspid Valve: The tricuspid valve appears unchanged from pre-bypass. _ Pulmonic Valve: The pulmonic valve appears unchanged from pre-bypass. _ Interatrial Septum: The interatrial septum appears unchanged from pre-bypass. _ Interventricular Septum: The interventricular septum appears unchanged from pre-bypass. _ Pericardium: The pericardium appears unchanged from pre-bypass. PRE-OP FINDINGS  Left Ventricle: The left ventricle has mild-moderately reduced systolic function, with an ejection fraction of 40-45%. The cavity size was normal. Left ventrical global hypokinesis without regional wall motion abnormalities. There is no left ventricular  hypertrophy. Left ventricular diastolic function could not be  evaluated. Right Ventricle: The right ventricle has slightly reduced systolic function. The cavity was normal. There is no increase in right ventricular wall thickness. There is no aneurysm seen. Left Atrium: Left atrial size was normal in size. No left atrial/left atrial appendage thrombus was detected. The left atrial appendage is well visualized and there is no evidence of thrombus present. Left atrial appendage velocity is normal at greater than 40 cm/s. Right Atrium: Right atrial size was normal in size. Interatrial Septum: No atrial level shunt detected by color flow Doppler. There is no evidence of a patent foramen ovale. Pericardium: There is no evidence of pericardial effusion. There is no pleural effusion. Mitral Valve: The mitral valve is normal in structure with annular calcification noted. Mitral valve regurgitation is mild by color flow Doppler. The MR jet is centrally-directed. There is no evidence of mitral valve vegetation. There is no evidence of mitral stenosis. Tricuspid Valve: The tricuspid valve was normal in structure. Tricuspid valve regurgitation was not visualized by color flow Doppler. No evidence of tricuspid stenosis is present. There is no evidence of tricuspid valve vegetation. Aortic Valve: The aortic valve is tricuspid Aortic valve regurgitation was not visualized by color flow Doppler. There is no stenosis of the aortic valve. There is no evidence of aortic valve vegetation. Pulmonic Valve: The pulmonic valve was normal in structure No evidence of pumonic stenosis. Pulmonic valve regurgitation is trivial by color flow  Doppler. Aorta: The aortic root, ascending aorta and aortic arch are normal in size and structure. Pulmonary Artery: The pulmonary artery is of normal size. Venous: The inferior vena cava was not well visualized. Shunts: There is no evidence of an atrial septal defect. +--------------+-------++ LEFT VENTRICLE        +--------------+-------++ PLAX 2D                +--------------+-------++ LVIDd:        5.47 cm +--------------+-------++ LVIDs:        3.63 cm +--------------+-------++ LV SV:        90 ml   +--------------+-------++ LV SV Index:  37.29   +--------------+-------++                       +--------------+-------++ +--------------+-----------++ MITRAL VALVE              +--------------+-----------++ MV Area (PHT):1.86 cm    +--------------+-----------++ MV PHT:       118.00 msec +--------------+-----------++  Suella Broad MD Electronically signed by Suella Broad MD Signature Date/Time: 11/23/2020/5:11:38 PM    Final    VAS US DOPPLER PRE CABG  Result Date: 11/22/2020 PREOPERATIVE VASCULAR EVALUATION Patient Name:  Baldomero Mirarchi  Date of Exam:   11/22/2020 Medical Rec #: [id number]           Accession #:    [id number] Date of Birth: 02-Sep-1945          Patient Gender: M Patient Age:   57 years Exam Location:  [facility] Procedure:      VAS US DOPPLER PRE CABG Referring Phys: HARRELL LIGHTFOOT --------------------------------------------------------------------------------  Indications:      Pre-CABG. Risk Factors:     None. Comparison Study: No prior studies. Performing Technologist: Carlos Levering RVT  Examination Guidelines: A complete evaluation includes B-mode imaging, spectral Doppler, color Doppler, and power Doppler as needed of all accessible portions of each vessel. Bilateral testing is considered an integral part of a complete examination. Limited examinations for reoccurring indications may be performed as noted.  Right Carotid Findings: +----------+--------+--------+--------+-----------------------+--------+           PSV cm/sEDV cm/sStenosisDescribe               Comments +----------+--------+--------+--------+-----------------------+--------+ CCA Prox  60      10              smooth and heterogenous         +----------+--------+--------+--------+-----------------------+--------+ CCA  Distal39      7               smooth and heterogenous         +----------+--------+--------+--------+-----------------------+--------+ ICA Prox  47      12              smooth and heterogenous         +----------+--------+--------+--------+-----------------------+--------+ ICA Distal76      19                                     tortuous +----------+--------+--------+--------+-----------------------+--------+ ECA       91      12                                              +----------+--------+--------+--------+-----------------------+--------+ +----------+--------+-------+--------+------------+  PSV cm/sEDV cmsDescribeArm Pressure +----------+--------+-------+--------+------------+ Subclavian77                                  +----------+--------+-------+--------+------------+ +---------+--------+--+--------+-+---------+ VertebralPSV cm/s42EDV cm/s7Antegrade +---------+--------+--+--------+-+---------+ Left Carotid Findings: +----------+--------+--------+--------+-----------------------+--------+           PSV cm/sEDV cm/sStenosisDescribe               Comments +----------+--------+--------+--------+-----------------------+--------+ CCA Prox  88      11              smooth and heterogenous         +----------+--------+--------+--------+-----------------------+--------+ CCA Distal57      12              smooth and heterogenous         +----------+--------+--------+--------+-----------------------+--------+ ICA Prox  44      12              smooth and heterogenous         +----------+--------+--------+--------+-----------------------+--------+ ICA Distal66      17                                     tortuous +----------+--------+--------+--------+-----------------------+--------+ ECA       98      11                                              +----------+--------+--------+--------+-----------------------+--------+  +----------+--------+--------+--------+------------+ SubclavianPSV cm/sEDV cm/sDescribeArm Pressure +----------+--------+--------+--------+------------+           156                                  +----------+--------+--------+--------+------------+ +---------+--------+--+--------+--+---------+ VertebralPSV cm/s51EDV cm/s14Antegrade +---------+--------+--+--------+--+---------+  ABI Findings: +--------+------------------+-----+---------+--------+ Right   Rt Pressure (mmHg)IndexWaveform Comment  +--------+------------------+-----+---------+--------+ [id number]                    triphasic         +--------+------------------+-----+---------+--------+ PTA     189               1.21 triphasic         +--------+------------------+-----+---------+--------+ DP      178               1.14 triphasic         +--------+------------------+-----+---------+--------+ +--------+------------------+-----+---------+-------+ Left    Lt Pressure (mmHg)IndexWaveform Comment +--------+------------------+-----+---------+-------+ [id number]                    triphasic        +--------+------------------+-----+---------+-------+ PTA     183               1.17 triphasic        +--------+------------------+-----+---------+-------+ DP      174               1.12 triphasic        +--------+------------------+-----+---------+-------+ +-------+---------------+----------------+ ABI/TBIToday's ABI/TBIPrevious ABI/TBI +-------+---------------+----------------+ Right  1.21                            +-------+---------------+----------------+ Left   1.17                            +-------+---------------+----------------+  Right Doppler Findings: +--------+--------+-----+---------+--------+ Site    PressureIndexDoppler  Comments +--------+--------+-----+---------+--------+ [id number]          triphasic         +--------+--------+-----+---------+--------+  Radial               triphasic         +--------+--------+-----+---------+--------+ Ulnar                triphasic         +--------+--------+-----+---------+--------+  Left Doppler Findings: +--------+--------+-----+---------+--------+ Site    PressureIndexDoppler  Comments +--------+--------+-----+---------+--------+ [id number]          triphasic         +--------+--------+-----+---------+--------+ Radial               triphasic         +--------+--------+-----+---------+--------+ Ulnar                triphasic         +--------+--------+-----+---------+--------+  Summary: Right Carotid: Velocities in the right ICA are consistent with a 1-39% stenosis. Left Carotid: Velocities in the left ICA are consistent with a 1-39% stenosis. Vertebrals: Bilateral vertebral arteries demonstrate antegrade flow. Right ABI: Resting right ankle-brachial index is within normal range. No evidence of significant right lower extremity arterial disease. Left ABI: Resting left ankle-brachial index is within normal range. No evidence of significant left lower extremity arterial disease. Right Upper Extremity: Doppler waveform obliterate with right radial compression. Doppler waveforms remain within normal limits with right ulnar compression. Left Upper Extremity: Doppler waveform obliterate with left radial compression. Doppler waveform obliterate with left ulnar compression.  Electronically signed by Deitra Mayo MD on 11/22/2020 at 12:17:14 PM.    Final      Treatments: surgery:  11/23/2020 Patient:  Kaylyn Lim Pre-Op Dx: NSTEMI 3V CAD DM HTN   Post-op Dx:  same Procedure: CABG X 4.  LIMA LAD, RSVG PDA, OM2, 2nd Diagonal   Endoscopic greater saphenous vein harvest on the right     Surgeon and Role:      * Lightfoot, Lucile Crater, MD - Primary    Evonnie Pat, [facility] - assisting  Discharge Exam: Blood pressure 105/79, pulse 82, temperature 97.7 F (36.5 C), temperature  source Oral, resp. rate 18, height 5\' 10"  (1.778 m), weight 107.5 kg, SpO2 98 %.  General appearance: alert, cooperative, and no distress Heart: regular rate and rhythm Lungs: clear Abdomen: benign Extremities: no edema Wound: incis healng well  Discharge Medications:  The patient has been discharged on:   1.Beta Blocker:  Yes [  y ]                              No   [   ]                              If No, reason:  2.Ace Inhibitor/ARB: Yes [   ]                                     No  [n    ]  If No, reason:  3.Statin:   Yes [ y  ]                  No  [   ]                  If No, reason:  4.Ecasa:  Yes  [ y  ]                  No   [   ]                  If No, reason:  Patient had ACS upon admission:  Plavix/P2Y12 inhibitor: Yes [ y  ]                                      No  [   ]     Discharge Instructions     Amb Referral to Cardiac Rehabilitation   Complete by: As directed    Diagnosis:  CABG NSTEMI     CABG X ___: 4   After initial evaluation and assessments completed: Virtual Based Care may be provided alone or in conjunction with Phase 2 Cardiac Rehab based on patient barriers.: Yes   Discharge patient   Complete by: As directed    Discharge disposition: 01-Home or Self Care   Discharge patient date: 11/30/2020      Allergies as of 11/30/2020       Reactions   Ciprofloxacin         Medication List     TAKE these medications    albuterol 108 (90 Base) MCG/ACT inhaler Commonly known as: VENTOLIN HFA albuterol sulfate HFA 90 mcg/actuation aerosol inhaler  Inhale 2 puffs every 4 hours by inhalation route as needed.   amiodarone 200 MG tablet Commonly known as: PACERONE Take 2 tablets (400 mg total) by mouth 2 (two) times daily for 7 days then take 2 tablets (400 mg) daily.   aspirin 81 MG EC tablet Take 1 tablet (81 mg total) by mouth daily. Swallow whole.   atorvastatin 80 MG tablet Commonly  known as: LIPITOR Take 1 tablet (80 mg total) by mouth daily at 6 PM.   buPROPion 300 MG 24 hr tablet Commonly known as: WELLBUTRIN XL Take 300 mg by mouth daily.   cariprazine 1.5 MG capsule Commonly known as: VRAYLAR Take 1.5 mg by mouth daily.   clopidogrel 75 MG tablet Commonly known as: PLAVIX Take 1 tablet (75 mg total) by mouth daily.   DULoxetine 60 MG capsule Commonly known as: CYMBALTA Take 120 mg by mouth daily.   metoprolol tartrate 50 MG tablet Commonly known as: LOPRESSOR Take 1 tablet (50 mg total) by mouth 2 (two) times daily.   montelukast 10 MG tablet Commonly known as: SINGULAIR Take 10 mg by mouth at bedtime.   oxyCODONE 5 MG immediate release tablet Commonly known as: Oxy IR/ROXICODONE Take 1 tablet (5 mg total) by mouth every 6 (six) hours as needed for up to 7 days for severe pain.   tamsulosin 0.4 MG Caps capsule Commonly known as: FLOMAX Take 0.4 mg by mouth daily.   TESTOSTERONE AQUEOUS IM Inject 400 mg into the muscle See admin instructions. 400 mg once a week every Friday        Follow-up Information     Lightfoot, Lucile Crater, MD Follow up.  Specialty: Cardiothoracic Surgery Why: Please see discharge paperwork. Your first appointment with surgeon wil be virtual, do not go to the office. Future appointments will be in person. Contact information: [address] [place] [address] [phone]         Imogene Burn, PA-C Follow up.   Specialty: Cardiology Why: [place] location - your cardiology follow-up has been moved to Tuesday Dec 06, 2020 at 12:45 PM (Arrive by 12:30 PM). Note this will be in our [place] location, not [facility]. Contact information: [address] [place] [address] [phone]         Celene Squibb, MD. Call.   Specialty: Internal Medicine Why: for a follow up of pre op HGA1C 6.8 Contact information: [address] [place]ella [place]ichert [facility]  [address] [phone]         Satira Sark, MD .   Specialty: Cardiology Contact information: [place] [address] [phone]                 Signed: John Giovanni, PA-C  12/01/2020, 2:49 PM

## 2020-11-25 NOTE — Progress Notes (Addendum)
      [address]       ,[place] [address]             [phone]                 2 Days Post-Op Procedure(s) (LRB): CORONARY ARTERY BYPASS GRAFTING (CABG) X FOUR, ON PUMP, USING LEFT INTERNAL MAMMARY ARTERY AND ENDOSCOPIALLY HARVESTED RIGHT GREATER SAPHENOUS VEIN CONDUITS (N/A) TRANSESOPHAGEAL ECHOCARDIOGRAM (TEE) (N/A) APPLICATION OF CELL SAVER ENDOVEIN HARVEST OF GREATER SAPHENOUS VEIN (Right)   Events: No events _______________________________________________________________ Vitals: BP 114/67   Pulse 79   Temp 98.6 F (37 C) (Oral)   Resp (!) 24   Ht 5\' 10"  (1.778 m)   Wt 117.4 kg   SpO2 96%   BMI 37.14 kg/m  Filed Weights   11/22/20 0440 11/24/20 0500 11/25/20 0500  Weight: 113.1 kg 118.7 kg 117.4 kg     - Neuro: alert NAD  - Cardiovascular: sinus  Drips: none.      - Pulm: EWOB    ABG    Component Value Date/Time   PHART 7.314 (L) 11/23/2020 1925   PCO2ART 41.7 11/23/2020 1925   PO2ART 85 11/23/2020 1925   HCO3 21.1 11/23/2020 1925   TCO2 22 11/23/2020 1925   ACIDBASEDEF 5.0 (H) 11/23/2020 1925   O2SAT 95.0 11/23/2020 1925    - Abd: ND - Extremity: cool  .Intake/Output      11/24 0701 11/25 0700 11/25 0701 11/26 0700   P.Woods. 1200    I.V. (mL/kg) 217 (1.8) 20 (0.2)   Blood     IV Piggyback 300.1    Total Intake(mL/kg) 1717 (14.6) 20 (0.2)   Urine (mL/kg/hr) 2720 (1) 20 (0.1)   Blood     Chest Tube 80    Total Output 2800 20   Net -1083 0           _______________________________________________________________ Labs: CBC Latest Ref Rng & Units 11/25/2020 11/24/2020 11/24/2020  WBC 4.0 - 10.5 K/uL 11.5(H) 12.1(H) 12.0(H)  Hemoglobin 13.0 - 17.0 g/dL 12.5(L) 12.7(L) 12.6(L)  Hematocrit 39.0 - 52.0 % 37.5(L) 38.0(L) 36.8(L)  Platelets 150 - 400 K/uL 146(L) 153 155   CMP Latest Ref Rng & Units 11/25/2020 11/24/2020 11/24/2020  Glucose 70 - 99 mg/dL 130(H) 153(H) 107(H)  BUN 8 - 23 mg/dL 31(H) 31(H) 29(H)   Creatinine 0.61 - 1.24 mg/dL 1.80(H) 1.85(H) 1.61(H)  Sodium 135 - 145 mmol/L 131(L) 132(L) 134(L)  Potassium 3.5 - 5.1 mmol/L 4.1 4.4 4.5  Chloride 98 - 111 mmol/L 98 100 107  CO2 22 - 32 mmol/L 25 25 20(L)  Calcium 8.9 - 10.3 mg/dL 8.5(L) 8.5(L) 7.8(L)  Total Protein 6.5 - 8.1 g/dL 5.6(L) - 4.9(L)  Total Bilirubin 0.3 - 1.2 mg/dL 1.8(H) - 1.7(H)  Alkaline Phos 38 - 126 U/L 44 - 37(L)  AST 15 - 41 U/L 38 - 68(H)  ALT 0 - 44 U/L 41 - 61(H)    CXR: PV congestion  _______________________________________________________________  Assessment and Plan: POD 2 s/p CABG  Neuro: pain controlled CV: will remove wires.  Increasing BB.  Will start plavix prior to discharge for NSTEMI Pulm: pulm hygiene Renal: creat down.  Will continue diuresis GI: on diet Heme: stable ID: [email or web address] Endo: SSI Dispo: floor today   Corey Woods Brittony Billick 11/25/2020 9:05 AM

## 2020-11-25 NOTE — Telephone Encounter (Addendum)
   I am covering cardmaster inbox today and received appt request from [facility] to arrange tentative post-CABG follow-up. Pt was originally scheduled to see Dr. Gardiner Rhyme in [place] office as new patient appointment. However, since he has been hospitalized, he was seen by [name] first. He lives in [place] so anticipate [name] to now be his primary cardiologist. There are currently no available close post-hospital f/u appts within appropriate timeframe in [place]. We will transition his appt to a post-hospital APP visit on 12/6 at [facility] location, appt info placed on AVS which includes notation of changed appt. Also spoke with pt so he is aware. He is agreeable.  Attention TOC pool,  This patient will need a TOC phone call after discharge. Discharge date TBD (had CABG 11/23/20). Follow-up appointment has already been arranged with: Ermalinda Barrios PA-C on 12/6 at [facility] location. They are a patient of Rozann Lesches, MD.  Thank you! Charlie Pitter, PA-C

## 2020-11-25 NOTE — Progress Notes (Signed)
Progress Note  Patient Name: Corey Woods Date of Encounter: 11/25/2020  [facility] Cardiologist: Rozann Lesches, MD   Subjective   Chest wall is sore.   Inpatient Medications    Scheduled Meds:  acetaminophen  1,000 mg Oral Q6H   Or   acetaminophen (TYLENOL) oral liquid 160 mg/5 mL  1,000 mg Per Tube Q6H   aspirin EC  325 mg Oral Daily   Or   aspirin  324 mg Per Tube Daily   atorvastatin  80 mg Oral q1800   bisacodyl  10 mg Oral Daily   Or   bisacodyl  10 mg Rectal Daily   buPROPion  300 mg Oral Daily   cariprazine  1.5 mg Oral Daily   chlorhexidine  15 mL Mouth Rinse BID   Chlorhexidine Gluconate Cloth  6 each Topical Daily   docusate sodium  200 mg Oral Daily   DULoxetine  60 mg Oral BID   enoxaparin (LOVENOX) injection  40 mg Subcutaneous QHS   insulin aspart  0-24 Units Subcutaneous Q4H   mouth rinse  15 mL Mouth Rinse q12n4p   metoprolol tartrate  12.5 mg Oral BID   Or   metoprolol tartrate  12.5 mg Per Tube BID   montelukast  10 mg Oral QHS   pantoprazole  40 mg Oral Daily   sodium chloride flush  3 mL Intravenous Q12H   Continuous Infusions:  sodium chloride 10 mL/hr at 11/25/20 0600   sodium chloride     sodium chloride     lactated ringers     lactated ringers Stopped (11/24/20 1149)   nitroGLYCERIN 0 mcg/min (11/23/20 1300)   PRN Meds: sodium chloride, albuterol, metoprolol tartrate, morphine injection, ondansetron (ZOFRAN) IV, oxyCODONE, pneumococcal 23 valent vaccine, sodium chloride flush, traMADol   Vital Signs    Vitals:   11/25/20 0300 11/25/20 0400 11/25/20 0500 11/25/20 0630  BP: (!) 122/54 112/60 (!) 154/66 (!) 155/67  Pulse: 79 74 (!) 115 82  Resp: (!) 27 (!) 26 (!) 33 (!) 21  Temp: 98.9 F (37.2 C)     TempSrc: Oral     SpO2: 96% 98% 100% 100%  Weight:   117.4 kg   Height:        Intake/Output Summary (Last 24 hours) at 11/25/2020 0740 Last data filed at 11/25/2020 0600 Gross per 24 hour  Intake 1717.01 ml   Output 2700 ml  Net -982.99 ml   Last 3 Weights 11/25/2020 11/24/2020 11/22/2020  Weight (lbs) 258 lb 13.1 oz 261 lb 11 oz 249 lb 6.4 oz  Weight (kg) 117.4 kg 118.7 kg 113.127 kg      Telemetry    Sinus- Personally Reviewed   Physical Exam   General: Well developed, well nourished, NAD  HEENT: OP clear, mucus membranes moist  SKIN: warm, dry. No rashes. Neuro: No focal deficits  Musculoskeletal: Muscle strength 5/5 all ext  Psychiatric: Mood and affect normal  Neck: No JVD,  Lungs: Clear  Cardiovascular: Regular rate and rhythm. No murmurs, gallops or rubs. Abdomen:Soft. Bowel sounds present. Non-tender.  Extremities: No lower extremity edema. Pulses are 2 + in the bilateral DP/PT.  Labs    High Sensitivity Troponin:   Recent Labs  Lab 11/18/20 1149 11/18/20 1319  TROPONINIHS 44* 180*     Chemistry Recent Labs  Lab 11/23/20 0441 11/23/20 0808 11/23/20 1917 11/23/20 1925 11/24/20 0350 11/24/20 1631 11/25/20 0432  NA 135   < > 134*   < > 134*  132* 131*  K 4.1   < > 4.7   < > 4.5 4.4 4.1  CL 102   < > 104  --  107 100 98  CO2 27  --  23  --  20* 25 25  GLUCOSE 115*   < > 146*  --  107* 153* 130*  BUN 25*   < > 29*  --  29* 31* 31*  CREATININE 1.64*   < > 1.55*  --  1.61* 1.85* 1.80*  CALCIUM 9.4  --  8.1*  --  7.8* 8.5* 8.5*  MG  --   --  3.2*  --  2.4 2.5*  --   PROT 5.6*  --   --   --  4.9*  --  5.6*  ALBUMIN 3.6  --   --   --  3.1*  --  3.2*  AST 104*  --   --   --  68*  --  38  ALT 120*  --   --   --  61*  --  41  ALKPHOS 62  --   --   --  37*  --  44  BILITOT 1.5*  --   --   --  1.7*  --  1.8*  GFRNONAA 44*  --  47*  --  45* 38* 39*  ANIONGAP 6  --  7  --  7 7 8    < > = values in this interval not displayed.    Lipids  Recent Labs  Lab 11/19/20 0021  CHOL 244*  TRIG 198*  HDL 59  LDLCALC 145*  CHOLHDL 4.1    Hematology Recent Labs  Lab 11/24/20 0350 11/24/20 1631 11/25/20 0432  WBC 12.0* 12.1* 11.5*  RBC 4.07* 4.15* 4.07*  HGB  12.6* 12.7* 12.5*  HCT 36.8* 38.0* 37.5*  MCV 90.4 91.6 92.1  MCH 31.0 30.6 30.7  MCHC 34.2 33.4 33.3  RDW 13.1 13.3 13.3  PLT 155 153 146*   Thyroid No results for input(s): TSH, FREET4 in the last 168 hours.  BNPNo results for input(s): BNP, PROBNP in the last 168 hours.  DDimer No results for input(s): DDIMER in the last 168 hours.   Radiology    DG Chest Port 1 View  Result Date: 11/24/2020 CLINICAL DATA:  Status post CABG. EXAM: PORTABLE CHEST 1 VIEW COMPARISON:  Chest x-ray from yesterday. FINDINGS: Interval removal of the endotracheal and enteric tubes. Unchanged right internal jugular sheath. Unchanged mediastinal drain and left chest tube. Stable cardiomegaly status post CABG. Continued low lung volumes with left perihilar and bibasilar atelectasis. No pneumothorax or significant pleural effusion. No acute osseous abnormality. IMPRESSION: 1. Interval removal of the endotracheal and enteric tubes. 2. Continued low lung volumes with bibasilar atelectasis. Electronically Signed   By: Titus Dubin M.D.   On: 11/24/2020 09:02   DG Chest Port 1 View  Result Date: 11/23/2020 CLINICAL DATA:  Coronary bypass EXAM: PORTABLE CHEST 1 VIEW COMPARISON:  11/22/2020 FINDINGS: Endotracheal tube 4.4 cm above the carina. Right IJ approach central line tip innominate SVC junction. Right base chest tube noted. Mediastinal drain projects over the chest. Patient is status post sternotomy for coronary bypass. Overall low lung volumes with perihilar and bibasilar atelectasis. Negative for edema. No large effusion or pneumothorax. Heart is enlarged. IMPRESSION: Low lung volumes and scattered atelectasis. Expected postoperative findings. No large effusion or pneumothorax. Electronically Signed   By: Jerilynn Mages.  Shick M.D.   On: 11/23/2020 13:33  Patient Profile     75 y.o. male with CAD, HTN, HLD, elevated blood sugar and CKD stage 3 admitted with chest pain, troponin elevation c/w a NSTEMI.  Patient taken  for CABG 11/23/2020.  Assessment & Plan    1.  Non-STEMI: Three-vessel CAD treated with multivessel CABG 11/23/2020. He is doing well today. Continue ASA, statin and beta blocker.   2.  Heart failure with reduced ejection fraction: Optimize medical therapy as he progresses from surgery.  Continue beta blocker. For now, hold on ACE/ARB in early postop period with baseline CKD 3.  3.  CKD 3: Creatinine overall stable today at 1.8.  UOP is good.    For questions or updates, please contact [place] Please consult [email or web address] for contact info under      Signed, Lauree Chandler, MD  11/25/2020, 7:40 AM

## 2020-11-26 DIAGNOSIS — I214 Non-ST elevation (NSTEMI) myocardial infarction: Secondary | ICD-10-CM | POA: Diagnosis not present

## 2020-11-26 LAB — BPAM RBC
Blood Product Expiration Date: 202212242359
Blood Product Expiration Date: 202212242359
Unit Type and Rh: 6200
Unit Type and Rh: 6200

## 2020-11-26 LAB — CBC
HCT: 37.6 % — ABNORMAL LOW (ref 39.0–52.0)
Hemoglobin: 12.6 g/dL — ABNORMAL LOW (ref 13.0–17.0)
MCH: 30.4 pg (ref 26.0–34.0)
MCHC: 33.5 g/dL (ref 30.0–36.0)
MCV: 90.8 fL (ref 80.0–100.0)
Platelets: 167 10*3/uL (ref 150–400)
RBC: 4.14 MIL/uL — ABNORMAL LOW (ref 4.22–5.81)
RDW: 13.2 % (ref 11.5–15.5)
WBC: 9.7 10*3/uL (ref 4.0–10.5)
nRBC: 0 % (ref 0.0–0.2)

## 2020-11-26 LAB — HEPATIC FUNCTION PANEL
ALT: 25 U/L (ref 0–44)
AST: 27 U/L (ref 15–41)
Albumin: 3 g/dL — ABNORMAL LOW (ref 3.5–5.0)
Alkaline Phosphatase: 45 U/L (ref 38–126)
Bilirubin, Direct: 0.4 mg/dL — ABNORMAL HIGH (ref 0.0–0.2)
Indirect Bilirubin: 1.5 mg/dL — ABNORMAL HIGH (ref 0.3–0.9)
Total Bilirubin: 1.9 mg/dL — ABNORMAL HIGH (ref 0.3–1.2)
Total Protein: 5.7 g/dL — ABNORMAL LOW (ref 6.5–8.1)

## 2020-11-26 LAB — BASIC METABOLIC PANEL
Anion gap: 8 (ref 5–15)
BUN: 29 mg/dL — ABNORMAL HIGH (ref 8–23)
CO2: 27 mmol/L (ref 22–32)
Calcium: 8.6 mg/dL — ABNORMAL LOW (ref 8.9–10.3)
Chloride: 98 mmol/L (ref 98–111)
Creatinine, Ser: 1.63 mg/dL — ABNORMAL HIGH (ref 0.61–1.24)
GFR, Estimated: 44 mL/min — ABNORMAL LOW (ref >60)
Glucose, Bld: 131 mg/dL — ABNORMAL HIGH (ref 70–99)
Potassium: 3.9 mmol/L (ref 3.5–5.1)
Sodium: 133 mmol/L — ABNORMAL LOW (ref 135–145)

## 2020-11-26 LAB — TYPE AND SCREEN
ABO/RH(D): A POS
Antibody Screen: NEGATIVE
Unit division: 0
Unit division: 0

## 2020-11-26 LAB — GLUCOSE, CAPILLARY
Glucose-Capillary: 124 mg/dL — ABNORMAL HIGH (ref 70–99)
Glucose-Capillary: 193 mg/dL — ABNORMAL HIGH (ref 70–99)
Glucose-Capillary: 147 mg/dL — ABNORMAL HIGH (ref 70–99)
Glucose-Capillary: 176 mg/dL — ABNORMAL HIGH (ref 70–99)

## 2020-11-26 MED ORDER — FUROSEMIDE 10 MG/ML IJ SOLN
80.0000 mg | Freq: Two times a day (BID) | INTRAMUSCULAR | Status: DC
  Administered 2020-11-27 (×2): 80 mg via INTRAVENOUS
  Filled 2020-11-26: qty 8

## 2020-11-26 MED ORDER — ALBUTEROL SULFATE (2.5 MG/3ML) 0.083% IN NEBU
2.5000 mg | INHALATION_SOLUTION | Freq: Two times a day (BID) | RESPIRATORY_TRACT | Status: DC
  Administered 2020-11-26 – 2020-11-30 (×8): 2.5 mg via RESPIRATORY_TRACT
  Filled 2020-11-26 (×8): qty 3

## 2020-11-26 MED ORDER — METOPROLOL TARTRATE 50 MG PO TABS
50.0000 mg | ORAL_TABLET | Freq: Two times a day (BID) | ORAL | Status: DC
  Administered 2020-11-26 – 2020-11-30 (×8): 50 mg via ORAL
  Filled 2020-11-26 (×8): qty 1

## 2020-11-26 MED ORDER — INSULIN ASPART 100 UNIT/ML IJ SOLN
0.0000 [IU] | Freq: Three times a day (TID) | INTRAMUSCULAR | Status: DC
  Administered 2020-11-26 – 2020-11-27 (×3): 4 [IU] via SUBCUTANEOUS
  Administered 2020-11-27: 13:00:00 8 [IU] via SUBCUTANEOUS
  Administered 2020-11-28: 07:00:00 2 [IU] via SUBCUTANEOUS
  Administered 2020-11-28 (×2): 4 [IU] via SUBCUTANEOUS
  Administered 2020-11-29: 2 [IU] via SUBCUTANEOUS
  Administered 2020-11-29 (×2): 4 [IU] via SUBCUTANEOUS
  Administered 2020-11-30: 8 [IU] via SUBCUTANEOUS
  Administered 2020-11-30: 2 [IU] via SUBCUTANEOUS

## 2020-11-26 MED ORDER — TAMSULOSIN HCL 0.4 MG PO CAPS
0.4000 mg | ORAL_CAPSULE | Freq: Every day | ORAL | Status: DC
  Administered 2020-11-26 – 2020-11-30 (×5): 0.4 mg via ORAL
  Filled 2020-11-26 (×5): qty 1

## 2020-11-26 NOTE — Progress Notes (Signed)
Progress Note  Patient Name: Corey Woods Date of Encounter: 11/26/2020  [facility] Cardiologist: Rozann Lesches, MD   Subjective   Thirsty, otherwise well.   Inpatient Medications    Scheduled Meds:  acetaminophen  1,000 mg Oral Q6H   Or   acetaminophen (TYLENOL) oral liquid 160 mg/5 mL  1,000 mg Per Tube Q6H   aspirin EC  325 mg Oral Daily   Or   aspirin  324 mg Per Tube Daily   atorvastatin  80 mg Oral q1800   bisacodyl  10 mg Oral Daily   Or   bisacodyl  10 mg Rectal Daily   buPROPion  300 mg Oral Daily   cariprazine  1.5 mg Oral Daily   chlorhexidine  15 mL Mouth Rinse BID   Chlorhexidine Gluconate Cloth  6 each Topical Daily   docusate sodium  200 mg Oral Daily   DULoxetine  60 mg Oral BID   enoxaparin (LOVENOX) injection  40 mg Subcutaneous QHS   furosemide  40 mg Oral Daily   insulin aspart  0-24 Units Subcutaneous Q4H   mouth rinse  15 mL Mouth Rinse q12n4p   metoprolol tartrate  25 mg Oral BID   montelukast  10 mg Oral QHS   pantoprazole  40 mg Oral Daily   potassium chloride  40 mEq Oral BID   sodium chloride flush  3 mL Intravenous Q12H   sodium chloride flush  3 mL Intravenous Q12H   Continuous Infusions:  sodium chloride Stopped (11/25/20 0925)   sodium chloride     sodium chloride     sodium chloride     lactated ringers     lactated ringers Stopped (11/24/20 1149)   nitroGLYCERIN 0 mcg/min (11/23/20 1300)   PRN Meds: sodium chloride, sodium chloride, albuterol, metoprolol tartrate, morphine injection, ondansetron (ZOFRAN) IV, oxyCODONE, pneumococcal 23 valent vaccine, sodium chloride flush, sodium chloride flush, traMADol   Vital Signs    Vitals:   11/26/20 0500 11/26/20 0600 11/26/20 0700 11/26/20 0837  BP:  (!) 145/111  134/86  Pulse:    92  Resp:  (!) 27  (!) 22  Temp:   98.3 F (36.8 C)   TempSrc:   Oral   SpO2:    96%  Weight: 113 kg     Height:        Intake/Output Summary (Last 24 hours) at 11/26/2020 1128 Last  data filed at 11/26/2020 1000 Gross per 24 hour  Intake --  Output 2450 ml  Net -2450 ml   Last 3 Weights 11/26/2020 11/25/2020 11/24/2020  Weight (lbs) 249 lb 1.9 oz 258 lb 13.1 oz 261 lb 11 oz  Weight (kg) 113 kg 117.4 kg 118.7 kg      Telemetry    Sinus rhythm- Personally Reviewed   Physical Exam   GEN: No acute distress.   Neck: No JVD Cardiac: RRR, no murmurs, rubs, or gallops. Sternotomy dressed, c/d/I. Respiratory: Clear to auscultation bilaterally. GI: Soft, nontender, non-distended  MS: No edema; No deformity. Neuro:  Nonfocal  Psych: Normal affect    Labs    High Sensitivity Troponin:   Recent Labs  Lab 11/18/20 1149 11/18/20 1319  TROPONINIHS 44* 180*     Chemistry Recent Labs  Lab 11/23/20 1917 11/23/20 1925 11/24/20 0350 11/24/20 1631 11/25/20 0432 11/26/20 0145  NA 134*   < > 134* 132* 131* 133*  K 4.7   < > 4.5 4.4 4.1 3.9  CL 104  --  107 100 98 98  CO2 23  --  20* 25 25 27   GLUCOSE 146*  --  107* 153* 130* 131*  BUN 29*  --  29* 31* 31* 29*  CREATININE 1.55*  --  1.61* 1.85* 1.80* 1.63*  CALCIUM 8.1*  --  7.8* 8.5* 8.5* 8.6*  MG 3.2*  --  2.4 2.5*  --   --   PROT  --   --  4.9*  --  5.6* 5.7*  ALBUMIN  --   --  3.1*  --  3.2* 3.0*  AST  --   --  68*  --  38 27  ALT  --   --  61*  --  41 25  ALKPHOS  --   --  37*  --  44 45  BILITOT  --   --  1.7*  --  1.8* 1.9*  GFRNONAA 47*  --  45* 38* 39* 44*  ANIONGAP 7  --  7 7 8 8    < > = values in this interval not displayed.    Lipids  No results for input(s): CHOL, TRIG, HDL, LABVLDL, LDLCALC, CHOLHDL in the last 168 hours.   Hematology Recent Labs  Lab 11/24/20 1631 11/25/20 0432 11/26/20 0145  WBC 12.1* 11.5* 9.7  RBC 4.15* 4.07* 4.14*  HGB 12.7* 12.5* 12.6*  HCT 38.0* 37.5* 37.6*  MCV 91.6 92.1 90.8  MCH 30.6 30.7 30.4  MCHC 33.4 33.3 33.5  RDW 13.3 13.3 13.2  PLT 153 146* 167   Thyroid No results for input(s): TSH, FREET4 in the last 168 hours.  BNPNo results for  input(s): BNP, PROBNP in the last 168 hours.  DDimer No results for input(s): DDIMER in the last 168 hours.   Radiology    DG Chest Port 1 View  Result Date: 11/25/2020 CLINICAL DATA:  Shortness of breath, chest pain EXAM: PORTABLE CHEST 1 VIEW COMPARISON:  11/24/2020 FINDINGS: Prior CABG. Cardiomegaly. Right central line remains in place, unchanged. Left base atelectasis. Mild vascular congestion. Low lung volumes. No pneumothorax. IMPRESSION: Low lung volumes with left base atelectasis and mild vascular congestion. Electronically Signed   By: Rolm Baptise M.D.   On: 11/25/2020 08:11     Patient Profile     75 y.o. male with CAD, HTN, HLD, elevated blood sugar and CKD stage 3 admitted with chest pain, troponin elevation c/w a NSTEMI.  Patient taken for CABG 11/23/2020.  Assessment & Plan    1.  Non-STEMI: Three-vessel CAD treated with multivessel CABG 11/23/2020. He is doing well today. Continue ASA, statin and beta blocker.   2.  Heart failure with reduced ejection fraction: Optimize medical therapy as he progresses from surgery.  Continue beta blocker - may want to transition to Toprol XL for mildly reduced EF. For now, hold on ACE/ARB/MRA in early postop period with baseline CKD 3. - can consider adding SGLT2I prior to discharge.  3.  CKD 3: Creatinine overall improving today at 1.63.  UOP is good.    For questions or updates, please contact [place] Please consult [email or web address] for contact info under      Signed, Elouise Munroe, MD  11/26/2020, 11:28 AM

## 2020-11-26 NOTE — Progress Notes (Signed)
Patient arrived to 4E08 from [facility] after CABGx4 11/23 w/[name].  Telemetry monitor applied and CCMD notified.  CHG bath and skin assessment completed.  Patient oriented to unit and room to include call light and phone.  Will continue to monitor.

## 2020-11-26 NOTE — Progress Notes (Signed)
Report given to Nicanor Bake 4E RN. Pt transferred to room 4E08. All pt belongings gathered and sent with pt. No other needs identified.

## 2020-11-27 ENCOUNTER — Inpatient Hospital Stay (HOSPITAL_COMMUNITY): Payer: Medicare Other

## 2020-11-27 LAB — BLOOD GAS, ARTERIAL
Acid-Base Excess: 0.7 mmol/L (ref 0.0–2.0)
Bicarbonate: 23.9 mmol/L (ref 20.0–28.0)
Drawn by: 24487
FIO2: 28
O2 Saturation: 99 %
Patient temperature: 37
pCO2 arterial: 32 mmHg (ref 32.0–48.0)
pH, Arterial: 7.486 — ABNORMAL HIGH (ref 7.350–7.450)
pO2, Arterial: 117 mmHg — ABNORMAL HIGH (ref 83.0–108.0)

## 2020-11-27 LAB — HEPATIC FUNCTION PANEL
ALT: 21 U/L (ref 0–44)
AST: 25 U/L (ref 15–41)
Albumin: 3.1 g/dL — ABNORMAL LOW (ref 3.5–5.0)
Alkaline Phosphatase: 49 U/L (ref 38–126)
Bilirubin, Direct: 0.3 mg/dL — ABNORMAL HIGH (ref 0.0–0.2)
Indirect Bilirubin: 1.4 mg/dL — ABNORMAL HIGH (ref 0.3–0.9)
Total Bilirubin: 1.7 mg/dL — ABNORMAL HIGH (ref 0.3–1.2)
Total Protein: 6.4 g/dL — ABNORMAL LOW (ref 6.5–8.1)

## 2020-11-27 LAB — BASIC METABOLIC PANEL
Anion gap: 10 (ref 5–15)
BUN: 29 mg/dL — ABNORMAL HIGH (ref 8–23)
CO2: 27 mmol/L (ref 22–32)
Calcium: 9 mg/dL (ref 8.9–10.3)
Chloride: 98 mmol/L (ref 98–111)
Creatinine, Ser: 1.65 mg/dL — ABNORMAL HIGH (ref 0.61–1.24)
GFR, Estimated: 44 mL/min — ABNORMAL LOW (ref >60)
Glucose, Bld: 171 mg/dL — ABNORMAL HIGH (ref 70–99)
Potassium: 4.3 mmol/L (ref 3.5–5.1)
Sodium: 135 mmol/L (ref 135–145)

## 2020-11-27 LAB — GLUCOSE, CAPILLARY
Glucose-Capillary: 189 mg/dL — ABNORMAL HIGH (ref 70–99)
Glucose-Capillary: 165 mg/dL — ABNORMAL HIGH (ref 70–99)
Glucose-Capillary: 214 mg/dL — ABNORMAL HIGH (ref 70–99)
Glucose-Capillary: 190 mg/dL — ABNORMAL HIGH (ref 70–99)
Glucose-Capillary: 223 mg/dL — ABNORMAL HIGH (ref 70–99)

## 2020-11-27 LAB — LACTIC ACID, PLASMA
Lactic Acid, Venous: 1.5 mmol/L (ref 0.5–1.9)
Lactic Acid, Venous: 1.3 mmol/L (ref 0.5–1.9)

## 2020-11-27 MED ORDER — ASPIRIN EC 81 MG PO TBEC
81.0000 mg | DELAYED_RELEASE_TABLET | Freq: Every day | ORAL | Status: DC
  Administered 2020-11-27 – 2020-11-30 (×3): 81 mg via ORAL
  Filled 2020-11-27 (×3): qty 1

## 2020-11-27 MED ORDER — CLOPIDOGREL BISULFATE 75 MG PO TABS
75.0000 mg | ORAL_TABLET | Freq: Every day | ORAL | Status: DC
  Administered 2020-11-27 – 2020-11-30 (×4): 75 mg via ORAL
  Filled 2020-11-27 (×4): qty 1

## 2020-11-27 MED ORDER — FUROSEMIDE 10 MG/ML IJ SOLN: 40.0000 mg | Freq: Once | INTRAMUSCULAR | Status: DC

## 2020-11-27 MED ORDER — POTASSIUM CHLORIDE CRYS ER 20 MEQ PO TBCR
20.0000 meq | EXTENDED_RELEASE_TABLET | Freq: Once | ORAL | Status: DC
  Filled 2020-11-27: qty 1

## 2020-11-27 MED ORDER — LACTULOSE 10 GM/15ML PO SOLN
20.0000 g | Freq: Once | ORAL | Status: AC
  Administered 2020-11-27: 09:00:00 20 g via ORAL
  Filled 2020-11-27: qty 30

## 2020-11-27 MED ORDER — TRAMADOL HCL 50 MG PO TABS: 50.0000 mg | ORAL_TABLET | ORAL | Status: DC | PRN

## 2020-11-27 MED ORDER — OXYCODONE HCL 5 MG PO TABS
5.0000 mg | ORAL_TABLET | ORAL | Status: DC | PRN
  Administered 2020-11-28 – 2020-11-30 (×6): 5 mg via ORAL
  Filled 2020-11-27 (×6): qty 1

## 2020-11-27 MED ORDER — ASPIRIN 81 MG PO CHEW
81.0000 mg | CHEWABLE_TABLET | Freq: Every day | ORAL | Status: DC
  Administered 2020-11-28: 10:00:00 81 mg
  Filled 2020-11-27: qty 1

## 2020-11-27 MED ORDER — FUROSEMIDE 10 MG/ML IJ SOLN
INTRAMUSCULAR | Status: AC
  Filled 2020-11-27: qty 8

## 2020-11-27 NOTE — Progress Notes (Addendum)
Patient Breathing remains lab. Using abdo. Muscles decrease in bases wheezing in neck throat area.resp. 24-28 skin warm diaphoric. Thighs mottled,. Tim R.N. aware and into assess patient. Resp  Ther. Sarah called to eval. Rapid Rapid Courtney aware and into assess patient. Dr. Humphrey Rolls text paged and made aware see new orders. LAsix 80 mg I.V. given PCXR done ABG's done,L.A. done.See vital signs flow sheet.

## 2020-11-27 NOTE — Progress Notes (Signed)
Upon returning from using the bathroom and sitting in the chair, pt went into afib/rvr with HR 210.  Pt was returned to bed and prn metoprolol 5mg  IV was given.  Spoke with CTS PA. EKG completed.  BP's cycled.  RR nurse at bedside.  Pt returned to NSR w/rate in the 80's after approximately 15 minutes.  Vital signs stable.  Patient comfortable in bed with call bell.  Will continue to monitor.

## 2020-11-27 NOTE — Progress Notes (Signed)
Asked to lay eyes on the pt who is wheezy in upper airway clear and diminished in lower, RR 26, SpO2 96 [place] 2L, diaphoretic, HR 88. Recommend calling md for lasix. Please call RRT if further assistance needed.

## 2020-11-27 NOTE — TOC Initial Note (Signed)
Transition of Care [facility]) - Initial/Assessment Note    Patient Details  Name: Corey Woods MRN: [id number] Date of Birth: 03/14/1945  [facility]) CM/SW Contact:    Verdell Carmine, RN Phone Number: 11/27/2020, 3:38 PM  Clinical Narrative:                  Patient presented with STEMI, CABG x4 on 11/23 Since that time recovery has been complicated by acute sweating episode,  and atrial fibrillation with RVR. That responded well to Lopressor.   The patient has had a couple of Rapid response calls on the stepdown unit.   Preparing for DC this week, PT consult obtained for recommendations. Is ambulating in room with support.   CM will follow for needs, recommendations, and transitions.  Expected Discharge Plan: [place] Barriers to Discharge: Continued Medical Work up   Patient Goals and CMS Choice        Expected Discharge Plan and Services Expected Discharge Plan: [place]       Living arrangements for the past 2 months: Single Family Home                                      Prior Living Arrangements/Services Living arrangements for the past 2 months: Single Family Home Lives with:: Spouse Patient language and need for interpreter reviewed:: Yes        Need for Family Participation in Patient Care: Yes (Comment) Care giver support system in place?: Yes (comment)   Criminal Activity/Legal Involvement Pertinent to Current Situation/Hospitalization: No - Comment as needed  Activities of Daily Living Home Assistive Devices/Equipment: CPAP ADL Screening (condition at time of admission) Patient's cognitive ability adequate to safely complete daily activities?: No Is the patient deaf or have difficulty hearing?: No Does the patient have difficulty seeing, even when wearing glasses/contacts?: No Does the patient have difficulty concentrating, remembering, or making decisions?: No Patient able to express  need for assistance with ADLs?: Yes Does the patient have difficulty dressing or bathing?: No Independently performs ADLs?: Yes (appropriate for developmental age) Does the patient have difficulty walking or climbing stairs?: No Weakness of Legs: None Weakness of Arms/Hands: None  Permission Sought/Granted                  Emotional Assessment       Orientation: : Oriented to Self, Oriented to Place, Oriented to  Time, Oriented to Situation Alcohol / Substance Use: Not Applicable Psych Involvement: No (comment)  Admission diagnosis:  PVC (premature ventricular contraction) [I49.3] NSTEMI (non-ST elevated myocardial infarction) ([place]) [I21.4] S/P CABG x 4 [Z95.1] Patient Active Problem List   Diagnosis Date Noted   S/P CABG x 4 11/23/2020   Coronary artery disease with hx of myocardial infarct w/o hx of CABG    Prediabetes    Primary pulmonary hypertension ([place])    Hyperlipidemia    Respiratory failure, post-operative ([place])    NSTEMI (non-ST elevated myocardial infarction) ([place]) 11/18/2020   PCP:  Celene Squibb, MD Pharmacy:   [facility] [address] [place] [address] Phone: [phone] Fax: [phone]     Social Determinants of Health (SDOH) Interventions    Readmission Risk Interventions No flowsheet data found.

## 2020-11-27 NOTE — Progress Notes (Signed)
Patient states , "I feel much better after the Lasix." Patient voided 2950 after Lasix given. Total for shift 3475 (7pm- 7 am)

## 2020-11-27 NOTE — Progress Notes (Signed)
   11/26/20 2304  Vitals  Temp 97.6 F (36.4 C)  Temp Source Oral  BP (!) 155/86  MAP (mmHg) 100  BP Location Left Arm  BP Method Automatic  Patient Position (if appropriate) Lying  Pulse Rate 86  Pulse Rate Source Monitor  ECG Heart Rate 86  Resp (!) 24  Level of Consciousness  Level of Consciousness Alert  MEWS COLOR  MEWS Score Color Green  Oxygen Therapy  SpO2 97 %  O2 Device Nasal Cannula  O2 Flow Rate (L/min) 2 L/min  Pain Assessment  Pain Scale 0-10  Pain Score 0  MEWS Score  MEWS Temp 0  MEWS Systolic 0  MEWS Pulse 0  MEWS RR 1  MEWS LOC 0  MEWS Score 1

## 2020-11-27 NOTE — Discharge Instructions (Addendum)
Discharge Instructions:  1. You may shower, please wash incisions daily with soap and water and keep dry.  If you wish to cover wounds with dressing you may do so but please keep clean and change daily.  No tub baths or swimming until incisions have completely healed.  If your incisions become red or develop any drainage please call our office at [phone]  2. No Driving until cleared by Dr. Abran Duke office and you are no longer using narcotic pain medications  3. Monitor your weight daily.. Please use the same scale and weigh at same time... If you gain 5-10 lbs in 48 hours with associated lower extremity swelling, please contact our office at [phone]  4. Fever of 101.5 for at least 24 hours with no source, please contact our office at [phone]  5. Activity- up as tolerated, please walk at least 3 times per day.  Avoid strenuous activity, no lifting, pushing, or pulling with your arms over 8-10 lbs for a minimum of 6 weeks  6. If any questions or concerns arise, please do not hesitate to contact our office at [phone]   Prediabetes Eating Plan Prediabetes is a condition that causes blood sugar (glucose) levels to be higher than normal. This increases the risk for developing type 2 diabetes (type 2 diabetes mellitus). Working with a health care provider or nutrition specialist (dietitian) to make diet and lifestyle changes can help prevent the onset of diabetes. These changes may help you: Control your blood glucose levels. Improve your cholesterol levels. Manage your blood pressure. What are tips for following this plan? Reading food labels Read food labels to check the amount of fat, salt (sodium), and sugar in prepackaged foods. Avoid foods that have: Saturated fats. Trans fats. Added sugars. Avoid foods that have more than 300 milligrams (mg) of sodium per serving. Limit your sodium intake to less than 2,300 mg each day. Shopping Avoid buying pre-made and processed  foods. Avoid buying drinks with added sugar. Cooking Cook with olive oil. Do not use butter, lard, or ghee. Bake, broil, grill, steam, or boil foods. Avoid frying. Meal planning Work with your dietitian to create an eating plan that is right for you. This may include tracking how many calories you take in each day. Use a food diary, notebook, or mobile application to track what you eat at each meal. Consider following a Mediterranean diet. This includes: Eating several servings of fresh fruits and vegetables each day. Eating fish at least twice a week. Eating one serving each day of whole grains, beans, nuts, and seeds. Using olive oil instead of other fats. Limiting alcohol. Limiting red meat. Using nonfat or low-fat dairy products. Consider following a plant-based diet. This includes dietary choices that focus on eating mostly vegetables and fruit, grains, beans, nuts, and seeds. If you have high blood pressure, you may need to limit your sodium intake or follow a diet such as the DASH (Dietary Approaches to Stop Hypertension) eating plan. The DASH diet aims to lower high blood pressure. Lifestyle Set weight loss goals with help from your health care team. It is recommended that most people with prediabetes lose 7% of their body weight. Exercise for at least 30 minutes 5 or more days a week. Attend a support group or seek support from a mental health counselor. Take over-the-counter and prescription medicines only as told by your health care provider. What foods are recommended? Fruits Berries. Bananas. Apples. Oranges. Grapes. Papaya. Mango. Pomegranate. Kiwi. Grapefruit. Cherries. Vegetables Lettuce. Spinach.  Peas. Beets. Cauliflower. Cabbage. Broccoli. Carrots. Tomatoes. Squash. Eggplant. Herbs. Peppers. Onions. Cucumbers. Brussels sprouts. Grains Whole grains, such as whole-wheat or whole-grain breads, crackers, cereals, and pasta. Unsweetened oatmeal. Bulgur. Barley. Quinoa. Brown  rice. Corn or whole-wheat flour tortillas or taco shells. Meats and other proteins Seafood. Poultry without skin. Lean cuts of pork and beef. Tofu. Eggs. Nuts. Beans. Dairy Low-fat or fat-free dairy products, such as yogurt, cottage cheese, and cheese. Beverages Water. Tea. Coffee. Sugar-free or diet soda. Seltzer water. Low-fat or nonfat milk. Milk alternatives, such as soy or almond milk. Fats and oils Olive oil. Canola oil. Sunflower oil. Grapeseed oil. Avocado. Walnuts. Sweets and desserts Sugar-free or low-fat pudding. Sugar-free or low-fat ice cream and other frozen treats. Seasonings and condiments Herbs. Sodium-free spices. Mustard. Relish. Low-salt, low-sugar ketchup. Low-salt, low-sugar barbecue sauce. Low-fat or fat-free mayonnaise. The items listed above may not be a complete list of recommended foods and beverages. Contact a dietitian for more information. What foods are not recommended? Fruits Fruits canned with syrup. Vegetables Canned vegetables. Frozen vegetables with butter or cream sauce. Grains Refined white flour and flour products, such as bread, pasta, snack foods, and cereals. Meats and other proteins Fatty cuts of meat. Poultry with skin. Breaded or fried meat. Processed meats. Dairy Full-fat yogurt, cheese, or milk. Beverages Sweetened drinks, such as iced tea and soda. Fats and oils Butter. Lard. Ghee. Sweets and desserts Baked goods, such as cake, cupcakes, pastries, cookies, and cheesecake. Seasonings and condiments Spice mixes with added salt. Ketchup. Barbecue sauce. Mayonnaise. The items listed above may not be a complete list of foods and beverages that are not recommended. Contact a dietitian for more information. Where to find more information American Diabetes Association: www.diabetes.org Summary You may need to make diet and lifestyle changes to help prevent the onset of diabetes. These changes can help you control blood sugar, improve  cholesterol levels, and manage blood pressure. Set weight loss goals with help from your health care team. It is recommended that most people with prediabetes lose 7% of their body weight. Consider following a Mediterranean diet. This includes eating plenty of fresh fruits and vegetables, whole grains, beans, nuts, seeds, fish, and low-fat dairy, and using olive oil instead of other fats. This information is not intended to replace advice given to you by your health care provider. Make sure you discuss any questions you have with your health care provider. Document Revised: 03/19/2019 Document Reviewed: 03/19/2019 Elsevier Patient Education  [place].

## 2020-11-27 NOTE — Progress Notes (Signed)
PT Cancellation Note  Patient Details Name: Corey Woods MRN: [id number] DOB: [birth year]   Cancelled Treatment:    Reason Eval/Treat Not Completed: Patient not medically ready. RN requesting hold on PT today due to HR jumping up to 210 bpm earlier with mobility. Will plan to follow-up tomorrow.   Moishe Spice, PT, DPT Acute Rehabilitation Services  Pager: [phone] Office: [place] 11/27/2020, 4:29 PM

## 2020-11-27 NOTE — Progress Notes (Signed)
While rounding on this patient he was found to be in Afib with RVR. He stated that he was having mild dyspnea associated with this. MD already aware. Metoprolol administered IVP. Patient converted back into NSR and remains in NSR. His dyspnea is resolved. All other vital signs stable.  BP 128/59 HR 89 RR 27 O2 96

## 2020-11-27 NOTE — Progress Notes (Addendum)
[address]       [place] [address]             [phone]        4 Days Post-Op Procedure(s) (LRB): CORONARY ARTERY BYPASS GRAFTING (CABG) X FOUR, ON PUMP, USING LEFT INTERNAL MAMMARY ARTERY AND ENDOSCOPIALLY HARVESTED RIGHT GREATER SAPHENOUS VEIN CONDUITS (N/A) TRANSESOPHAGEAL ECHOCARDIOGRAM (TEE) (N/A) APPLICATION OF CELL SAVER ENDOVEIN HARVEST OF GREATER SAPHENOUS VEIN (Right)  Subjective: Events last evening, earlier this am noted. Patient felt lousy earlier this am as he "broke out in a sweat" and per nurse report, he looked "mottled". He denied chest pain or shortness of breath. He states after given Lasix he "peed a lot" his breathing has improved.  Objective: Vital signs in last 24 hours: Temp:  [97.5 F (36.4 C)-98.2 F (36.8 C)] 98.2 F (36.8 C) (11/27 0600) Pulse Rate:  [86-100] 100 (11/27 0600) Cardiac Rhythm: Normal sinus rhythm (11/26 2015) Resp:  [20-28] 21 (11/27 0600) BP: (129-163)/(69-89) 156/81 (11/27 0600) SpO2:  [91 %-100 %] 91 % (11/27 0721) Weight:  [110.5 kg] 110.5 kg (11/27 0400)   Current Weight  11/27/20 110.5 kg       Intake/Output from previous day: 11/26 0701 - 11/27 0700 In: 720 [P.O.:720] Out: 4925 [Urine:4925]   Physical Exam:  Cardiovascular: Slightly tachycardic this am Pulmonary: Expiratory wheezing Abdomen: Soft, non tender, sporadic bowel sounds present. Extremities: Mild bilateral lower extremity edema. B/l legs/feet cool and hands warm. Palpable pulses in feet, slight discoloration Wounds: Clean and dry.  No erythema or signs of infection.  Lab Results: CBC: Recent Labs    11/25/20 0432 11/26/20 0145  WBC 11.5* 9.7  HGB 12.5* 12.6*  HCT 37.5* 37.6*  PLT 146* 167   BMET:  Recent Labs    11/26/20 0145 11/27/20 0116  NA 133* 135  K 3.9 4.3  CL 98 98  CO2 27 27  GLUCOSE 131* 171*  BUN 29* 29*  CREATININE 1.63* 1.65*  CALCIUM 8.6* 9.0    PT/INR:  Lab Results  Component Value  Date   INR 1.3 (H) 11/23/2020   INR 1.0 11/22/2020   ABG:  INR: Will add last result for INR, ABG once components are confirmed Will add last 4 CBG results once components are confirmed  Assessment/Plan:  1. CV - S/p NSTEMI. Slightly tachy this am. On Lopressor 50 mg bid. Will start Plavix and decrease ec asa. 2.  Pulmonary - On room air. Results of ABG and CXR earlier this am noted. Check PA/LAT CXR in am. Of note, patient wears CPAP at night. Ordered. Encourage incentive spirometer 3. CKD (stage III)-Creatinine 1.65 (1.63 yesterday). Admission creatinine 1.37 4.  Expected post op acute blood loss anemia - H and H yesterday stable at 12.6 and 37.6 5. LFTS "wnl", t bili slightly decreased to 1.7 6. CBGs 176/189/165. History of pre diabetes. Pre op HGA1C 6.8. He will need further surveillance of HGA1C as outpatient 7. Volume overload-given Lasix 80 mg IV after midnight. As discussed with Dr. Cyndia Bent, will give Lasix 40 mg IV this am 8. Unsure of etiology of this am's diaphoresis but clinically at the time of my exam, patient feeling better and not in distress. He is taking narcotics for pain  Donielle M ZimmermanPA-C 11/27/2020,7:30 AM    Chart reviewed, patient examined, agree with above. Episode of shortness of breath, wheezing and looking in distress last pm. Rapid response was called and then a Dr. Humphrey Rolls who  ordered lasix, CXR ABG. I was never called.   He made 4L of urine with 80 lasix overnight and -4200 cc/24 hrs. Wt down 6 lbs and below preop. He looks ok this am and sats 93% on 2L [place]. CXR overnight looked ok. No pulmonary edema, effusion or significant atelectasis.  He is flirting with atrial fib today with frequent PAC's and had run of SVT this am. On Lopressor 50 bid and received replacement K+

## 2020-11-28 ENCOUNTER — Inpatient Hospital Stay (HOSPITAL_COMMUNITY): Payer: Medicare Other

## 2020-11-28 LAB — BASIC METABOLIC PANEL
Anion gap: 12 (ref 5–15)
BUN: 45 mg/dL — ABNORMAL HIGH (ref 8–23)
CO2: 28 mmol/L (ref 22–32)
Calcium: 9.1 mg/dL (ref 8.9–10.3)
Chloride: 92 mmol/L — ABNORMAL LOW (ref 98–111)
Creatinine, Ser: 1.92 mg/dL — ABNORMAL HIGH (ref 0.61–1.24)
GFR, Estimated: 36 mL/min — ABNORMAL LOW (ref >60)
Glucose, Bld: 170 mg/dL — ABNORMAL HIGH (ref 70–99)
Potassium: 4.5 mmol/L (ref 3.5–5.1)
Sodium: 132 mmol/L — ABNORMAL LOW (ref 135–145)

## 2020-11-28 LAB — GLUCOSE, CAPILLARY
Glucose-Capillary: 145 mg/dL — ABNORMAL HIGH (ref 70–99)
Glucose-Capillary: 135 mg/dL — ABNORMAL HIGH (ref 70–99)
Glucose-Capillary: 196 mg/dL — ABNORMAL HIGH (ref 70–99)
Glucose-Capillary: 184 mg/dL — ABNORMAL HIGH (ref 70–99)
Glucose-Capillary: 170 mg/dL — ABNORMAL HIGH (ref 70–99)

## 2020-11-28 LAB — HEPATIC FUNCTION PANEL
ALT: 18 U/L (ref 0–44)
AST: 20 U/L (ref 15–41)
Albumin: 2.8 g/dL — ABNORMAL LOW (ref 3.5–5.0)
Alkaline Phosphatase: 60 U/L (ref 38–126)
Bilirubin, Direct: 0.2 mg/dL (ref 0.0–0.2)
Indirect Bilirubin: 1 mg/dL — ABNORMAL HIGH (ref 0.3–0.9)
Total Bilirubin: 1.2 mg/dL (ref 0.3–1.2)
Total Protein: 6.5 g/dL (ref 6.5–8.1)

## 2020-11-28 LAB — TSH: TSH: 7.437 u[IU]/mL — ABNORMAL HIGH (ref 0.350–4.500)

## 2020-11-28 MED ORDER — AMIODARONE HCL 200 MG PO TABS
400.0000 mg | ORAL_TABLET | Freq: Two times a day (BID) | ORAL | Status: DC
  Administered 2020-11-28 – 2020-11-30 (×5): 400 mg via ORAL
  Filled 2020-11-28 (×5): qty 2

## 2020-11-28 NOTE — Care Management Important Message (Signed)
Important Message  Patient Details  Name: Corey Woods MRN: [id number] Date of Birth: 1945/07/18   Medicare Important Message Given:  Yes     Shelda Altes 11/28/2020, 10:34 AM

## 2020-11-28 NOTE — Progress Notes (Addendum)
[address]       [place] [address]             [phone]      5 Days Post-Op Procedure(s) (LRB): CORONARY ARTERY BYPASS GRAFTING (CABG) X FOUR, ON PUMP, USING LEFT INTERNAL MAMMARY ARTERY AND ENDOSCOPIALLY HARVESTED RIGHT GREATER SAPHENOUS VEIN CONDUITS (N/A) TRANSESOPHAGEAL ECHOCARDIOGRAM (TEE) (N/A) APPLICATION OF CELL SAVER ENDOVEIN HARVEST OF GREATER SAPHENOUS VEIN (Right) Subjective: Feels pretty well overall  Objective: Vital signs in last 24 hours: Temp:  [97.8 F (36.6 C)-98.4 F (36.9 C)] 98.2 F (36.8 C) (11/28 0327) Pulse Rate:  [72-103] 81 (11/28 0400) Cardiac Rhythm: Atrial fibrillation (11/28 0548) Resp:  [20-27] 22 (11/28 0615) BP: (121-155)/(57-88) 121/71 (11/28 0615) SpO2:  [91 %-96 %] 93 % (11/28 0600) Weight:  [110.5 kg] 110.5 kg (11/28 0500)  Hemodynamic parameters for last 24 hours:    Intake/Output from previous day: 11/27 0701 - 11/28 0700 In: 480 [P.O.:480] Out: -  Intake/Output this shift: No intake/output data recorded.  General appearance: alert, cooperative, and no distress Heart: regular rate and rhythm Lungs: dim in bases Abdomen: benign Extremities: no edema Wound: incis healing well  Lab Results: Recent Labs    11/26/20 0145  WBC 9.7  HGB 12.6*  HCT 37.6*  PLT 167   BMET:  Recent Labs    11/27/20 0116 11/28/20 0116  NA 135 132*  K 4.3 4.5  CL 98 92*  CO2 27 28  GLUCOSE 171* 170*  BUN 29* 45*  CREATININE 1.65* 1.92*  CALCIUM 9.0 9.1    PT/INR: No results for input(s): LABPROT, INR in the last 72 hours. ABG    Component Value Date/Time   PHART 7.486 (H) 11/27/2020 0030   HCO3 23.9 11/27/2020 0030   TCO2 22 11/23/2020 1925   ACIDBASEDEF 5.0 (H) 11/23/2020 1925   O2SAT 99.0 11/27/2020 0030   CBG (last 3)  Recent Labs    11/27/20 1808 11/27/20 2130 11/28/20 0611  GLUCAP 190* 223* 135*    Meds Scheduled Meds:  acetaminophen  1,000 mg Oral Q6H   Or   acetaminophen (TYLENOL)  oral liquid 160 mg/5 mL  1,000 mg Per Tube Q6H   albuterol  2.5 mg Nebulization BID   aspirin EC  81 mg Oral Daily   Or   aspirin  81 mg Per Tube Daily   atorvastatin  80 mg Oral q1800   bisacodyl  10 mg Oral Daily   Or   bisacodyl  10 mg Rectal Daily   buPROPion  300 mg Oral Daily   cariprazine  1.5 mg Oral Daily   chlorhexidine  15 mL Mouth Rinse BID   clopidogrel  75 mg Oral Daily   docusate sodium  200 mg Oral Daily   DULoxetine  60 mg Oral BID   enoxaparin (LOVENOX) injection  40 mg Subcutaneous QHS   furosemide  40 mg Intravenous Once   insulin aspart  0-24 Units Subcutaneous TID WC   mouth rinse  15 mL Mouth Rinse q12n4p   metoprolol tartrate  50 mg Oral BID   montelukast  10 mg Oral QHS   pantoprazole  40 mg Oral Daily   potassium chloride  20 mEq Oral Once   sodium chloride flush  3 mL Intravenous Q12H   sodium chloride flush  3 mL Intravenous Q12H   tamsulosin  0.4 mg Oral Daily   Continuous Infusions:  sodium chloride Stopped (11/25/20 0925)   sodium chloride  sodium chloride     sodium chloride     lactated ringers     lactated ringers Stopped (11/24/20 1149)   PRN Meds:.sodium chloride, sodium chloride, albuterol, metoprolol tartrate, morphine injection, ondansetron (ZOFRAN) IV, oxyCODONE, pneumococcal 23 valent vaccine, sodium chloride flush, sodium chloride flush, traMADol  Xrays DG Chest Port 1 View  Result Date: 11/27/2020 CLINICAL DATA:  Acute heart failure. EXAM: PORTABLE CHEST 1 VIEW COMPARISON:  November 25, 2020 FINDINGS: Multiple sternal wires are seen. The right-sided venous catheter noted on the prior study has been removed. Mild right perihilar linear atelectasis is seen. There is no evidence of a pleural effusion or pneumothorax. The cardiac silhouette is mildly enlarged and unchanged in size. Degenerative changes are seen throughout the thoracic spine. IMPRESSION: 1. Evidence of prior median sternotomy/CABG. 2. Mild right perihilar linear  atelectasis. Electronically Signed   By: Virgina Norfolk M.D.   On: 11/27/2020 00:16    Assessment/Plan: S/P Procedure(s) (LRB): CORONARY ARTERY BYPASS GRAFTING (CABG) X FOUR, ON PUMP, USING LEFT INTERNAL MAMMARY ARTERY AND ENDOSCOPIALLY HARVESTED RIGHT GREATER SAPHENOUS VEIN CONDUITS (N/A) TRANSESOPHAGEAL ECHOCARDIOGRAM (TEE) (N/A) APPLICATION OF CELL SAVER ENDOVEIN HARVEST OF GREATER SAPHENOUS VEIN (Right)  1 afeb, VSS sBP 120's -150's, atrial fibrillation, RVR at times- will start po amio- only has PIV currently 2 sats good on RA 3 voiding, not measured- weight below preop, will d/c lasix 4 CBG's 135-223 yesterday 5 creat up 1.92 today, typically range 1.4-1.8 6 CXR l basilar atx/ASD 7 push rehab and pulm toilet as able   LOS: 10 days    John Giovanni 11/28/2020  Agree with above Started on amiodarone p.o. Will continue to follow creatinine Continue rehab  [place]

## 2020-11-28 NOTE — Progress Notes (Signed)
CARDIAC REHAB PHASE I   PRE:  Rate/Rhythm: 94 SR    BP: sitting 136/65    SaO2: 96 3 1/2L supine, 97 RA EOB  MODE:  Ambulation: 400 ft   POST:  Rate/Rhythm: 108 ST, sitting 186 afib    BP: sitting 138/120, recheck 131/101     SaO2: 98 RA  Pt needed reminders of sternal precautions getting out of bed. He admits to being forgetful after surgery. Able to decrease O2 to RA and pt ambulated with min assist, no AD. Rested periodically for SOB but overall tolerated well. Upon sitting in recliner pt converted to Afib RVR. Max 186, mostly 160s. Pt sts he can feel afib. BP registered elevated. RN present and giving meds. Pt performing 1500 ml on IS. Encouraged x2 more walks with staff when HR controlled.  [phone]  [place], ACSM 11/28/2020 10:06 AM

## 2020-11-28 NOTE — Evaluation (Signed)
Physical Therapy Evaluation Patient Details Name: Corey Woods MRN: [id number] DOB: Aug 13, 1945 Today's Date: 11/28/2020  History of Present Illness  Pt is 75 yo male admitted on 11/18/20 with chest pain and NSTEMI and s/p CABG x 4 on 11/23/20. Pt with hx of CAD, HTN, HLD, CKD 3  Clinical Impression  Pt admitted with above diagnosis. At baseline, pt is very independent.  He lives with wife and will have assist as needed at d/c.  Today, pt ambulating 70' but easily fatigued and mild unsteadiness.  Requiring cues for sternal precautions. Pt currently with functional limitations due to the deficits listed below (see PT Problem List). Pt will benefit from skilled PT to increase their independence and safety with mobility to allow discharge to the venue listed below.          Recommendations for follow up therapy are one component of a multi-disciplinary discharge planning process, led by the attending physician.  Recommendations may be updated based on patient status, additional functional criteria and insurance authorization.  Follow Up Recommendations No PT follow up    Assistance Recommended at Discharge Intermittent Supervision/Assistance  Functional Status Assessment Patient has had a recent decline in their functional status and demonstrates the ability to make significant improvements in function in a reasonable and predictable amount of time.  Equipment Recommendations  Other (comment) (likely no AD needs)    Recommendations for Other Services       Precautions / Restrictions Precautions Precautions: Sternal Precaution Booklet Issued: Yes (comment) (sternal)      Mobility  Bed Mobility               General bed mobility comments: in chair; educated on log roll    Transfers Overall transfer level: Needs assistance Equipment used: None Transfers: Sit to/from Stand Sit to Stand: Min guard           General transfer comment: cues for sternal precautions with  sitting and standing    Ambulation/Gait Ambulation/Gait assistance: Min guard Gait Distance (Feet): 90 Feet Assistive device: None Gait Pattern/deviations: Step-through pattern Gait velocity: decreased     General Gait Details: mild unsteadiness and easily fatiguing  Stairs            Wheelchair Mobility    Modified Rankin (Stroke Patients Only)       Balance Overall balance assessment: Needs assistance Sitting-balance support: No upper extremity supported Sitting balance-Leahy Scale: Normal     Standing balance support: No upper extremity supported Standing balance-Leahy Scale: Good                               Pertinent Vitals/Pain Pain Assessment: 0-10 Pain Score: 4  Pain Location: sternum-surgical site Pain Descriptors / Indicators: Discomfort Pain Intervention(s): Limited activity within patient's tolerance;Monitored during session    Home Living Family/patient expects to be discharged to:: Private residence Living Arrangements: Spouse/significant other Available Help at Discharge: Family;Available 24 hours/day Type of Home: House Home Access: Stairs to enter Entrance Stairs-Rails: Left Entrance Stairs-Number of Steps: 3   Home Layout: One level Home Equipment: Grab bars - tub/shower;Shower seat - built in      Prior Function Prior Level of Function : Independent/Modified Independent;Driving                     Journalist, newspaper        Extremity/Trunk Assessment   Upper Extremity Assessment Upper Extremity Assessment: Overall [facility]  for tasks assessed (within sternal prec)    Lower Extremity Assessment Lower Extremity Assessment: Overall WFL for tasks assessed    Cervical / Trunk Assessment Cervical / Trunk Assessment: Normal  Communication   Communication: No difficulties  Cognition Arousal/Alertness: Awake/alert Behavior During Therapy: WFL for tasks assessed/performed Overall Cognitive Status: Within Functional  Limits for tasks assessed                                 General Comments: pt reports feeling like he is in "a fog"        General Comments General comments (skin integrity, edema, etc.): Pt's HR in 80's throughout Session (was up to 170 bpm earlier today with activity); O2 sats 97%; BP stable    Exercises     Assessment/Plan    PT Assessment Patient needs continued PT services  PT Problem List Decreased strength;Decreased mobility;Decreased safety awareness;Decreased knowledge of precautions;Decreased activity tolerance;Cardiopulmonary status limiting activity;Decreased balance;Decreased knowledge of use of DME       PT Treatment Interventions DME instruction;Therapeutic activities;Gait training;Therapeutic exercise;Patient/family education;Stair training;Balance training;Functional mobility training    PT Goals (Current goals can be found in the Care Plan section)  Acute Rehab PT Goals Patient Stated Goal: return home PT Goal Formulation: With patient Time For Goal Achievement: 12/12/20 Potential to Achieve Goals: Good    Frequency Min 3X/week   Barriers to discharge        Co-evaluation               AM-PAC PT "6 Clicks" Mobility  Outcome Measure Help needed turning from your back to your side while in a flat bed without using bedrails?: None Help needed moving from lying on your back to sitting on the side of a flat bed without using bedrails?: A Little Help needed moving to and from a bed to a chair (including a wheelchair)?: A Little Help needed standing up from a chair using your arms (e.g., wheelchair or bedside chair)?: A Little Help needed to walk in hospital room?: A Little Help needed climbing 3-5 steps with a railing? : A Little 6 Click Score: 19    End of Session Equipment Utilized During Treatment: Gait belt Activity Tolerance: Patient tolerated treatment well Patient left: in chair;with call bell/phone within reach Nurse  Communication: Mobility status PT Visit Diagnosis: Other abnormalities of gait and mobility (R26.89)    Time: 5697-9480 PT Time Calculation (min) (ACUTE ONLY): 13 min   Charges:   PT Evaluation $PT Eval Low Complexity: 1 Low          Jashae Wiggs, PT Acute Rehab Services Pager [phone] [facility] [phone]   Karlton Lemon 11/28/2020, 1:14 PM

## 2020-11-28 NOTE — Progress Notes (Signed)
When ask, patient states No assistance is needed w/ home unit CPAP. No complications noted

## 2020-11-29 LAB — GLUCOSE, CAPILLARY
Glucose-Capillary: 158 mg/dL — ABNORMAL HIGH (ref 70–99)
Glucose-Capillary: 132 mg/dL — ABNORMAL HIGH (ref 70–99)
Glucose-Capillary: 193 mg/dL — ABNORMAL HIGH (ref 70–99)
Glucose-Capillary: 173 mg/dL — ABNORMAL HIGH (ref 70–99)
Glucose-Capillary: 173 mg/dL — ABNORMAL HIGH (ref 70–99)

## 2020-11-29 LAB — T4, FREE: Free T4: 1.2 ng/dL — ABNORMAL HIGH (ref 0.61–1.12)

## 2020-11-29 NOTE — Progress Notes (Signed)
[address]       [place] [address]             [phone]      6 Days Post-Op Procedure(s) (LRB): CORONARY ARTERY BYPASS GRAFTING (CABG) X FOUR, ON PUMP, USING LEFT INTERNAL MAMMARY ARTERY AND ENDOSCOPIALLY HARVESTED RIGHT GREATER SAPHENOUS VEIN CONDUITS (N/A) TRANSESOPHAGEAL ECHOCARDIOGRAM (TEE) (N/A) APPLICATION OF CELL SAVER ENDOVEIN HARVEST OF GREATER SAPHENOUS VEIN (Right) Subjective: Feels ok, some sternal discomfort  Objective: Vital signs in last 24 hours: Temp:  [97.6 F (36.4 C)-98 F (36.7 C)] 97.8 F (36.6 C) (11/29 0759) Pulse Rate:  [32-97] 85 (11/29 0759) Cardiac Rhythm: Normal sinus rhythm (11/28 1931) Resp:  [16-20] 19 (11/29 0759) BP: (118-156)/(65-120) 137/79 (11/29 0759) SpO2:  [92 %-100 %] 96 % (11/29 0816) Weight:  [109.4 kg] 109.4 kg (11/29 0400)  Hemodynamic parameters for last 24 hours:    Intake/Output from previous day: 11/28 0701 - 11/29 0700 In: 600 [P.O.:600] Out: -  Intake/Output this shift: Total I/O In: 240 [P.O.:240] Out: -   General appearance: alert, cooperative, and no distress Heart: regular rate and rhythm Lungs: min dim in bases Abdomen: benign Extremities: no edema Wound: incis healing well  Lab Results: No results for input(s): WBC, HGB, HCT, PLT in the last 72 hours. BMET:  Recent Labs    11/27/20 0116 11/28/20 0116  NA 135 132*  K 4.3 4.5  CL 98 92*  CO2 27 28  GLUCOSE 171* 170*  BUN 29* 45*  CREATININE 1.65* 1.92*  CALCIUM 9.0 9.1    PT/INR: No results for input(s): LABPROT, INR in the last 72 hours. ABG    Component Value Date/Time   PHART 7.486 (H) 11/27/2020 0030   HCO3 23.9 11/27/2020 0030   TCO2 22 11/23/2020 1925   ACIDBASEDEF 5.0 (H) 11/23/2020 1925   O2SAT 99.0 11/27/2020 0030   CBG (last 3)  Recent Labs    11/28/20 1717 11/28/20 2034 11/29/20 0608  GLUCAP 184* 170* 158*    Meds Scheduled Meds:  albuterol  2.5 mg Nebulization BID   amiodarone  400 mg  Oral BID   aspirin EC  81 mg Oral Daily   Or   aspirin  81 mg Per Tube Daily   atorvastatin  80 mg Oral q1800   bisacodyl  10 mg Oral Daily   Or   bisacodyl  10 mg Rectal Daily   buPROPion  300 mg Oral Daily   cariprazine  1.5 mg Oral Daily   chlorhexidine  15 mL Mouth Rinse BID   clopidogrel  75 mg Oral Daily   docusate sodium  200 mg Oral Daily   DULoxetine  60 mg Oral BID   enoxaparin (LOVENOX) injection  40 mg Subcutaneous QHS   insulin aspart  0-24 Units Subcutaneous TID WC   mouth rinse  15 mL Mouth Rinse q12n4p   metoprolol tartrate  50 mg Oral BID   montelukast  10 mg Oral QHS   pantoprazole  40 mg Oral Daily   potassium chloride  20 mEq Oral Once   sodium chloride flush  3 mL Intravenous Q12H   sodium chloride flush  3 mL Intravenous Q12H   tamsulosin  0.4 mg Oral Daily   Continuous Infusions:  sodium chloride Stopped (11/25/20 0925)   sodium chloride     sodium chloride     sodium chloride     lactated ringers     lactated ringers Stopped (11/24/20 1149)  PRN Meds:.sodium chloride, sodium chloride, albuterol, metoprolol tartrate, morphine injection, ondansetron (ZOFRAN) IV, oxyCODONE, pneumococcal 23 valent vaccine, sodium chloride flush, sodium chloride flush, traMADol  Xrays DG Chest 2 View  Result Date: 11/28/2020 CLINICAL DATA:  Pleural effusion EXAM: CHEST - 2 VIEW COMPARISON:  11/27/2020 FINDINGS: Coronary bypass changes noted. Stable cardiac enlargement and similar bibasilar atelectasis, worse on the left. Trace left effusion suspected. No right pleural effusion. Negative for pneumothorax. Trachea midline. IMPRESSION: Stable cardiomegaly without CHF. Minor persistent basilar atelectasis and trace left effusion. Electronically Signed   By: Jerilynn Mages.  Shick M.D.   On: 11/28/2020 08:10    Assessment/Plan: S/P Procedure(s) (LRB): CORONARY ARTERY BYPASS GRAFTING (CABG) X FOUR, ON PUMP, USING LEFT INTERNAL MAMMARY ARTERY AND ENDOSCOPIALLY HARVESTED RIGHT GREATER  SAPHENOUS VEIN CONDUITS (N/A) TRANSESOPHAGEAL ECHOCARDIOGRAM (TEE) (N/A) APPLICATION OF CELL SAVER ENDOVEIN HARVEST OF GREATER SAPHENOUS VEIN (Right)   1 afeb, VSS SBP 110's to 150's, sinus on po amio/metop- no afib since 11:00 yesterday, on ASA/Statin/plavix- no ARB/ACE-I with renal insuff 2 sats good on RA 3 voiding, not measured, BMET pending, weight below preop 4 TSH elevated at 7.4, checked when placed on amio- will check Free T4- may need synthroid, will need outpatient management  5 BS fair control- no meds preop, Hg A1c 6.8- cont SSI will need good diet control /medical follow up 6 sees Dr Kenton Kingfisher for primary care - knows he needs close follow up of DM/Thyroid/renal insuff for longterm management 7 cont pulm hygiene and cardiac rehab 8 home 1-2 days   LOS: 11 days    John Giovanni PA-C Pager [phone] 11/29/2020

## 2020-11-29 NOTE — Progress Notes (Addendum)
Mobility Specialist: Progress Note   11/29/20 1539  Mobility  Activity Ambulated in hall  Level of Assistance Modified independent, requires aide device or extra time  Assistive Device Front wheel walker  Distance Ambulated (ft) 290 ft  Mobility Ambulated with assistance in hallway  Mobility Response Tolerated well  Mobility performed by Mobility specialist  Bed Position Chair  $Mobility charge 1 Mobility   Pre-Mobility:       Supine: 76 HR, 121/102 BP, 95% SpO2      Sitting EOB: 110/64 BP      Standing: 94/56 BP Post-Mobility: 81 HR, 133/80 BP  Pt independent with bed mobility as well as to stand. Pt c/o feeling SOB during ambulation but had no c/o feeling dizzy or light headed. Pt to recliner after walk with call bell in his lap.  [facility] Marguarite Markov Mobility Specialist Mobility Specialist Phone #1: [phone] Mobility Specialist Phone #2: [phone]

## 2020-11-29 NOTE — Progress Notes (Signed)
Cardiology Office Note    Date:  12/06/2020   ID:  Corey Woods, Corey Woods Jan 21, 1945, MRN [id number]   PCP:  Celene Squibb, MD   [place]  Cardiologist:  Rozann Lesches, MD   Advanced Practice Provider:  No care team member to display Electrophysiologist:  None   [id number]}   Chief Complaint  Patient presents with   Follow-up   Hospitalization Follow-up     History of Present Illness:  Corey Woods is a 75 y.o. male  with CAD, HTN, HLD, elevated blood sugar and CKD stage 3 admitted with chest pain, troponin elevation c/w a NSTEMI.  Patient taken for CABG 11/23/2020.  LVEF 40 to 45% on preop echo, postop improved with no wall motion abnormalities noted but no EF given.  Not started on ACE/ARB/MRA with CKD stage III.  Creatinine 1.67.  Patient comes in for f/u. Has been wheezing a lot. Not using albuterol inhaler as much as he should. He's not sleeping at all. Constipated from oxycodone.    Past Medical History:  Diagnosis Date   Asthmatic bronchitis    Benign bladder tumor    CAD (coronary artery disease) 2002   BMS x 2 and DES - [place] (multivessel)   Chronic renal failure, stage 3 (moderate), unspecified whether stage 3a or 3b CKD (HCC)    Depression    MI (myocardial infarction) ([place]) 2002   Orthostatic hypotension 2002   Prediabetes    Statin intolerance    Crestor - myalgias    Past Surgical History:  Procedure Laterality Date   CORONARY ANGIOPLASTY WITH STENT PLACEMENT     CORONARY ARTERY BYPASS GRAFT N/A 11/23/2020   Procedure: CORONARY ARTERY BYPASS GRAFTING (CABG) X FOUR, ON PUMP, USING LEFT INTERNAL MAMMARY ARTERY AND ENDOSCOPIALLY HARVESTED RIGHT GREATER SAPHENOUS VEIN CONDUITS;  Surgeon: Lajuana Matte, MD;  Location: [facility];  Service: Open Heart Surgery;  Laterality: N/A;   ENDOVEIN HARVEST OF GREATER SAPHENOUS VEIN Right 11/23/2020   Procedure: ENDOVEIN HARVEST OF GREATER SAPHENOUS VEIN;  Surgeon: Lajuana Matte, MD;  Location: [facility];  Service: Open Heart Surgery;  Laterality: Right;   FOOT SURGERY     LEFT HEART CATH AND CORONARY ANGIOGRAPHY N/A 11/21/2020   Procedure: LEFT HEART CATH AND CORONARY ANGIOGRAPHY;  Surgeon: Martinique, Peter M, MD;  Location: [facility];  Service: Cardiovascular;  Laterality: N/A;   TEE WITHOUT CARDIOVERSION N/A 11/23/2020   Procedure: TRANSESOPHAGEAL ECHOCARDIOGRAM (TEE);  Surgeon: Lajuana Matte, MD;  Location: [facility];  Service: Open Heart Surgery;  Laterality: N/A;   TONSILLECTOMY      Current Medications: Current Meds  Medication Sig   albuterol (VENTOLIN HFA) 108 (90 Base) MCG/ACT inhaler albuterol sulfate HFA 90 mcg/actuation aerosol inhaler  Inhale 2 puffs every 4 hours by inhalation route as needed.   aspirin EC 81 MG EC tablet Take 1 tablet (81 mg total) by mouth daily. Swallow whole.   buPROPion (WELLBUTRIN XL) 300 MG 24 hr tablet Take 300 mg by mouth daily.   cariprazine (VRAYLAR) 1.5 MG capsule Take 1.5 mg by mouth daily.   DULoxetine (CYMBALTA) 60 MG capsule Take 120 mg by mouth daily.   montelukast (SINGULAIR) 10 MG tablet Take 10 mg by mouth at bedtime.   oxyCODONE (OXY IR/ROXICODONE) 5 MG immediate release tablet Take 1 tablet (5 mg total) by mouth every 6 (six) hours as needed for up to 7 days for severe pain.   tamsulosin (  FLOMAX) 0.4 MG CAPS capsule Take 0.4 mg by mouth daily.   TESTOSTERONE AQUEOUS IM Inject 400 mg into the muscle See admin instructions. 400 mg once a week every Friday   [DISCONTINUED] amiodarone (PACERONE) 200 MG tablet Take 2 tablets (400 mg total) by mouth 2 (two) times daily for 7 days then take 2 tablets (400 mg) daily.   [DISCONTINUED] atorvastatin (LIPITOR) 80 MG tablet Take 1 tablet (80 mg total) by mouth daily at 6 PM.   [DISCONTINUED] clopidogrel (PLAVIX) 75 MG tablet Take 1 tablet (75 mg total) by mouth daily.   [DISCONTINUED] metoprolol tartrate (LOPRESSOR) 50 MG tablet Take 1 tablet (50 mg total) by  mouth 2 (two) times daily.     Allergies:   Bee venom and Ciprofloxacin   Social History   Socioeconomic History   Marital status: Unknown    Spouse name: Not on file   Number of children: Not on file   Years of education: Not on file   Highest education level: Not on file  Occupational History   Not on file  Tobacco Use   Smoking status: Former    Types: Cigarettes    Quit date: 2002    Years since quitting: 20.9   Smokeless tobacco: Never  Substance and Sexual Activity   Alcohol use: Yes    Comment: Regularly - out with friends   Drug use: Never   Sexual activity: Not on file  Other Topics Concern   Not on file  Social History Narrative   Not on file   Social Determinants of Health   Financial Resource Strain: Not on file  Food Insecurity: Not on file  Transportation Needs: Not on file  Physical Activity: Not on file  Stress: Not on file  Social Connections: Not on file     Family History:  The patient's  family history includes Heart disease in his father; Leukemia in his mother; Stroke in his father.   ROS:   Please see the history of present illness.    ROS All other systems reviewed and are negative.   PHYSICAL EXAM:   VS:  BP 118/74 (BP Location: Right Arm, Patient Position: Sitting, Cuff Size: Normal)   Pulse 65   Ht 5\' 10"  (1.778 m)   Wt 247 lb 3.2 oz (112.1 kg)   SpO2 97%   BMI 35.47 kg/m   Physical Exam  GEN: Obese, in no acute distress  Neck: no JVD, carotid bruits, or masses Cardiac:incisions healing well,RRR; no murmurs, rubs, or gallops  Respiratory:  clear to auscultation bilaterally, normal work of breathing GI: soft, nontender, nondistended, + BS Ext: without cyanosis, clubbing, or edema, Good distal pulses bilaterally Neuro:  Alert and Oriented x 3, Psych: euthymic mood, full affect  Wt Readings from Last 3 Encounters:  12/06/20 247 lb 3.2 oz (112.1 kg)  11/30/20 236 lb 15.9 oz (107.5 kg)      Studies/Labs Reviewed:   EKG:   EKG is  ordered today.  The ekg ordered today demonstrates NSR with lateral TWI  Recent Labs: 11/24/2020: Magnesium 2.5 11/26/2020: Hemoglobin 12.6; Platelets 167 11/27/2020: TSH 7.437 11/28/2020: ALT 18 11/30/2020: BUN 45; Creatinine, Ser 1.67; Potassium 4.6; Sodium 132   Lipid Panel    Component Value Date/Time   CHOL 244 (H) 11/19/2020 0021   TRIG 198 (H) 11/19/2020 0021   HDL 59 11/19/2020 0021   CHOLHDL 4.1 11/19/2020 0021   VLDL 40 11/19/2020 0021   LDLCALC 145 (H) 11/19/2020 0021  Additional studies/ records that were reviewed today include:  TEE 11/23/2020 POST-OP IMPRESSIONS     s/p CABG x 4  _ Left Ventricle: LVEF improved. CO > 5 L/min, CI > 2.5 L/min/m. No RWMA's  noted.  _ Right Ventricle: The right ventricle appears unchanged from pre-bypass.  _ Aorta: Aorta unchanged, no dissection noted after removal of the  cannula.  _ Left Atrium: The left atrium appears unchanged from pre-bypass.  _ Left Atrial Appendage: The left atrial appendage appears unchanged from  pre-bypass.  _ Aortic Valve: The aortic valve appears unchanged from pre-bypass.  _ Mitral Valve: The mitral valve appears unchanged from pre-bypass.  _ Tricuspid Valve: The tricuspid valve appears unchanged from pre-bypass.  _ Pulmonic Valve: The pulmonic valve appears unchanged from pre-bypass.  _ Interatrial Septum: The interatrial septum appears unchanged from  pre-bypass.  _ Interventricular Septum: The interventricular septum appears unchanged  from  pre-bypass.  _ Pericardium: The pericardium appears unchanged from pre-bypass.   PRE-OP FINDINGS   Left Ventricle: The left ventricle has mild-moderately reduced systolic  function, with an ejection fraction of 40-45%. The cavity size was normal.  Left ventrical global hypokinesis without regional wall motion  abnormalities. There is no left ventricular   hypertrophy. Left ventricular diastolic function could not be evaluated.   Right  Ventricle: The right ventricle has slightly reduced systolic  function. The cavity was normal. There is no increase in right ventricular  wall thickness. There is no aneurysm seen.   Left Atrium: Left atrial size was normal in size. No left atrial/left  atrial appendage thrombus was detected. The left atrial appendage is well  visualized and there is no evidence of thrombus present. Left atrial  appendage velocity is normal at greater  than 40 cm/s.   Right Atrium: Right atrial size was normal in size.   Interatrial Septum: No atrial level shunt detected by color flow Doppler.  There is no evidence of a patent foramen ovale.   Pericardium: There is no evidence of pericardial effusion. There is no  pleural effusion.   Mitral Valve: The mitral valve is normal in structure with annular  calcification noted. Mitral valve regurgitation is mild by color flow  Doppler. The MR jet is centrally-directed. There is no evidence of mitral  valve vegetation. There is no evidence of  mitral stenosis.   Tricuspid Valve: The tricuspid valve was normal in structure. Tricuspid  valve regurgitation was not visualized by color flow Doppler. No evidence  of tricuspid stenosis is present. There is no evidence of tricuspid valve  vegetation.   Aortic Valve: The aortic valve is tricuspid Aortic valve regurgitation was  not visualized by color flow Doppler. There is no stenosis of the aortic  valve. There is no evidence of aortic valve vegetation.   Pulmonic Valve: The pulmonic valve was normal in structure No evidence of  pumonic stenosis.  Pulmonic valve regurgitation is trivial by color flow Doppler.    Aorta: The aortic root, ascending aorta and aortic arch are normal in size  and structure.   Pulmonary Artery: The pulmonary artery is of normal size.   Venous: The inferior vena cava was not well visualized.   Shunts: There is no evidence of an atrial septal defect.   Cardiac cath  11/21/2020   Prox LAD to Mid LAD lesion is 95% stenosed.   Mid LAD lesion is 50% stenosed.   Dist LAD lesion is 80% stenosed.   1st Diag lesion is 90% stenosed.  Mid Cx to Dist Cx lesion is 80% stenosed.   2nd Mrg lesion is 75% stenosed.   3rd Mrg lesion is 75% stenosed.   Prox RCA to Mid RCA lesion is 90% stenosed.   Mid RCA to Dist RCA lesion is 100% stenosed.   LV end diastolic pressure is normal.   Severe 3 vessel obstructive CAD Normal LVEDP   Plan: CT surgery consult for CABG    Risk Assessment/Calculations:    CHA2DS2-VASc Score =     This indicates a  % annual risk of stroke. The patient's score is based upon:          ASSESSMENT:    1. Coronary artery disease involving coronary bypass graft of native heart without angina pectoris   2. Ischemic cardiomyopathy   3. Stage 3 chronic kidney disease, unspecified whether stage 3a or 3b CKD (HCC)   4. Paroxysmal atrial fibrillation (HCC)      PLAN:  In order of problems listed above:  CAD status post CABG 11/23/2020 Will f/u with [name] in [place] on ASA, Plavix, lipitor and metoprolol. Trouble with insomnia-can take benadryl and increase activity.  Post op Afib in NSR on amiodarone-plan to stop after 2 months.   Ischemic cardiomyopathy on beta-blocker.  ACE/ARB/MRA held early postop because of CKD stage III-can check echo in 3 months  CKD stage III Crt 1.67 11/30/20  Shared Decision Making/Informed Consent        Medication Adjustments/Labs and Tests Ordered: Current medicines are reviewed at length with the patient today.  Concerns regarding medicines are outlined above.  Medication changes, Labs and Tests ordered today are listed in the Patient Instructions below. Patient Instructions  Medication Instructions:   Your physician recommends that you continue on your current medications as directed. Please refer to the Current Medication list given to you today.   ( YOU HAVE BEEN RECOMMENDED TO  TAKE BENADRYL UP  25 -50 MG ONCE A DAY AS NEEDED)   *If you need a refill on your cardiac medications before your next appointment, please call your pharmacy*   Lab Work: [place]   If you have labs (blood work) drawn today and your tests are completely normal, you will receive your results only by: [place] (if you have MyChart) OR A paper copy in the mail If you have any lab test that is abnormal or we need to change your treatment, we will call you to review the results.   Testing/Procedures: NONE ORDERED  TODAY   Follow-Up: At [facility], you and your health needs are our priority.  As part of our continuing mission to provide you with exceptional heart care, we have created designated Provider Care Teams.  These Care Teams include your primary Cardiologist (physician) and Advanced Practice Providers (APPs -  Physician Assistants and Nurse Practitioners) who all work together to provide you with the care you need, when you need it.  We recommend signing up for the patient portal called "MyChart".  Sign up information is provided on this After Visit Summary.  MyChart is used to connect with patients for Virtual Visits (Telemedicine).  Patients are able to view lab/test results, encounter notes, upcoming appointments, etc.  Non-urgent messages can be sent to your provider as well.   To learn more about what you can do with MyChart, go to [email or web address].    Your next appointment:   2 month(s)  The format for your next  appointment:   In Person  Provider:   You may see Rozann Lesches, MD or one of the following Advanced Practice Providers on your designated Care Team:    Ermalinda Barrios, PA-C     Other Instructions     Signed, Ermalinda Barrios, PA-C  12/06/2020 1:46 PM    [facility] [address], [place], [place]  [address] Phone: [phone]; Fax: [phone]

## 2020-11-29 NOTE — Progress Notes (Signed)
Pt self administers home CPAP. 

## 2020-11-29 NOTE — Progress Notes (Signed)
CARDIAC REHAB PHASE I   PRE:  Rate/Rhythm: 77 SR    BP: sitting 141/69    SaO2: 97 RA  MODE:  Ambulation: 290 ft   POST:  Rate/Rhythm: 84 SR    BP: sitting 129/69     SaO2: 100 RA  Pt needed verbal cues and mod assist to get out of bed. Slightly unsteady walking, min assist. Might do better in shoes. Initially felt well however after 150 ft pt c/o sudden weakness and SOB, requested to sit. Sat and rested 3 min. Able to stand and walk back to room however BP now lower. Encouraged being with staff when he gets up and taking orthostatics later. Left in recliner.  [place], ACSM 11/29/2020 11:46 AM

## 2020-11-30 ENCOUNTER — Other Ambulatory Visit (HOSPITAL_COMMUNITY): Payer: Self-pay

## 2020-11-30 LAB — BASIC METABOLIC PANEL
Anion gap: 7 (ref 5–15)
BUN: 45 mg/dL — ABNORMAL HIGH (ref 8–23)
CO2: 29 mmol/L (ref 22–32)
Calcium: 8.9 mg/dL (ref 8.9–10.3)
Chloride: 96 mmol/L — ABNORMAL LOW (ref 98–111)
Creatinine, Ser: 1.67 mg/dL — ABNORMAL HIGH (ref 0.61–1.24)
GFR, Estimated: 43 mL/min — ABNORMAL LOW (ref >60)
Glucose, Bld: 150 mg/dL — ABNORMAL HIGH (ref 70–99)
Potassium: 4.6 mmol/L (ref 3.5–5.1)
Sodium: 132 mmol/L — ABNORMAL LOW (ref 135–145)

## 2020-11-30 LAB — GLUCOSE, CAPILLARY
Glucose-Capillary: 143 mg/dL — ABNORMAL HIGH (ref 70–99)
Glucose-Capillary: 221 mg/dL — ABNORMAL HIGH (ref 70–99)

## 2020-11-30 MED ORDER — METOPROLOL TARTRATE 50 MG PO TABS
50.0000 mg | ORAL_TABLET | Freq: Two times a day (BID) | ORAL | 1 refills | Status: DC
  Filled 2020-11-30: qty 60, 30d supply, fill #0

## 2020-11-30 MED ORDER — CLOPIDOGREL BISULFATE 75 MG PO TABS
75.0000 mg | ORAL_TABLET | Freq: Every day | ORAL | 1 refills | Status: DC
  Filled 2020-11-30: qty 30, 30d supply, fill #0

## 2020-11-30 MED ORDER — AMIODARONE HCL 200 MG PO TABS
400.0000 mg | ORAL_TABLET | Freq: Two times a day (BID) | ORAL | 1 refills | Status: DC
  Filled 2020-11-30: qty 74, 30d supply, fill #0

## 2020-11-30 MED ORDER — ASPIRIN 81 MG PO TBEC: 81.0000 mg | DELAYED_RELEASE_TABLET | Freq: Every day | ORAL | Status: AC

## 2020-11-30 MED ORDER — ATORVASTATIN CALCIUM 80 MG PO TABS
80.0000 mg | ORAL_TABLET | Freq: Every day | ORAL | 1 refills | Status: DC
  Filled 2020-11-30: qty 30, 30d supply, fill #0

## 2020-11-30 MED ORDER — OXYCODONE HCL 5 MG PO TABS
5.0000 mg | ORAL_TABLET | Freq: Four times a day (QID) | ORAL | 0 refills | Status: AC | PRN
  Filled 2020-11-30: qty 28, 7d supply, fill #0

## 2020-11-30 NOTE — Progress Notes (Signed)
Mobility Specialist: Progress Note   11/30/20 1137  Mobility  Activity Ambulated in hall  Level of Assistance Independent  Assistive Device None  Distance Ambulated (ft) 400 ft  Mobility Ambulated independently in hallway  Mobility Response Tolerated well  Mobility performed by Mobility specialist  Bed Position Chair  $Mobility charge 1 Mobility   Pre-Mobility: 83 HR, 105/79 BP, 97% SpO2 Post-Mobility: 86 HR, 92% SpO2  Pt had no c/o throughout ambulation. Pt to recliner after walk with call bell and phone in reach. Pt hopeful for discharge later today.   [facility] Trayson Stitely Mobility Specialist Mobility Specialist Phone #1: [phone] Mobility Specialist Phone #2: [phone]

## 2020-11-30 NOTE — Progress Notes (Signed)
Discussed IS, sternal precautions, diet, exercise, and CRPII. Pt receptive. Will refer to [place].  [id number] Yves Dill CES, ACSM 10:11 AM 11/30/2020

## 2020-11-30 NOTE — Plan of Care (Signed)
  Problem: Health Behavior/Discharge Planning: Goal: Ability to manage health-related needs will improve Outcome: Progressing   Problem: Clinical Measurements: Goal: Will remain free from infection Outcome: Progressing Goal: Respiratory complications will improve Outcome: Progressing Goal: Cardiovascular complication will be avoided Outcome: Progressing   

## 2020-11-30 NOTE — Progress Notes (Signed)
Pt discharging home. AVS reviewed. Belongings packed. Family at bedside. Volunteers coming to transport pt to front entrance.

## 2020-11-30 NOTE — Progress Notes (Addendum)
[address]       [place] [address]             [phone]      7 Days Post-Op Procedure(s) (LRB): CORONARY ARTERY BYPASS GRAFTING (CABG) X FOUR, ON PUMP, USING LEFT INTERNAL MAMMARY ARTERY AND ENDOSCOPIALLY HARVESTED RIGHT GREATER SAPHENOUS VEIN CONDUITS (N/A) TRANSESOPHAGEAL ECHOCARDIOGRAM (TEE) (N/A) APPLICATION OF CELL SAVER ENDOVEIN HARVEST OF GREATER SAPHENOUS VEIN (Right) Subjective: Feels well, anxious to go home  Objective: Vital signs in last 24 hours: Temp:  [97.6 F (36.4 C)-98.4 F (36.9 C)] 97.6 F (36.4 C) (11/30 0507) Pulse Rate:  [66-85] 75 (11/30 0507) Cardiac Rhythm: Normal sinus rhythm (11/29 2300) Resp:  [17-19] 17 (11/30 0507) BP: (133-152)/(63-80) 151/63 (11/30 0507) SpO2:  [94 %-99 %] 98 % (11/30 0507) Weight:  [107.5 kg] 107.5 kg (11/30 0507)  Hemodynamic parameters for last 24 hours:    Intake/Output from previous day: 11/29 0701 - 11/30 0700 In: 1130.5 [P.O.:720; I.V.:410.5] Out: -  Intake/Output this shift: No intake/output data recorded.  General appearance: alert, cooperative, and no distress Heart: regular rate and rhythm Lungs: clear Abdomen: benign Extremities: no edema Wound: incis healng well  Lab Results: No results for input(s): WBC, HGB, HCT, PLT in the last 72 hours. BMET:  Recent Labs    11/28/20 0116 11/30/20 0146  NA 132* 132*  K 4.5 4.6  CL 92* 96*  CO2 28 29  GLUCOSE 170* 150*  BUN 45* 45*  CREATININE 1.92* 1.67*  CALCIUM 9.1 8.9    PT/INR: No results for input(s): LABPROT, INR in the last 72 hours. ABG    Component Value Date/Time   PHART 7.486 (H) 11/27/2020 0030   HCO3 23.9 11/27/2020 0030   TCO2 22 11/23/2020 1925   ACIDBASEDEF 5.0 (H) 11/23/2020 1925   O2SAT 99.0 11/27/2020 0030   CBG (last 3)  Recent Labs    11/29/20 1617 11/29/20 2123 11/30/20 0633  GLUCAP 173* 173* 143*    Meds Scheduled Meds:  albuterol  2.5 mg Nebulization BID   amiodarone  400 mg Oral BID    aspirin EC  81 mg Oral Daily   Or   aspirin  81 mg Per Tube Daily   atorvastatin  80 mg Oral q1800   bisacodyl  10 mg Oral Daily   Or   bisacodyl  10 mg Rectal Daily   cariprazine  1.5 mg Oral Daily   chlorhexidine  15 mL Mouth Rinse BID   clopidogrel  75 mg Oral Daily   docusate sodium  200 mg Oral Daily   DULoxetine  60 mg Oral BID   enoxaparin (LOVENOX) injection  40 mg Subcutaneous QHS   insulin aspart  0-24 Units Subcutaneous TID WC   mouth rinse  15 mL Mouth Rinse q12n4p   metoprolol tartrate  50 mg Oral BID   montelukast  10 mg Oral QHS   pantoprazole  40 mg Oral Daily   potassium chloride  20 mEq Oral Once   tamsulosin  0.4 mg Oral Daily   Continuous Infusions:  lactated ringers Stopped (11/24/20 1149)   PRN Meds:.albuterol, metoprolol tartrate, ondansetron (ZOFRAN) IV, oxyCODONE, pneumococcal 23 valent vaccine, traMADol  Xrays No results found.  Assessment/Plan: S/P Procedure(s) (LRB): CORONARY ARTERY BYPASS GRAFTING (CABG) X FOUR, ON PUMP, USING LEFT INTERNAL MAMMARY ARTERY AND ENDOSCOPIALLY HARVESTED RIGHT GREATER SAPHENOUS VEIN CONDUITS (N/A) TRANSESOPHAGEAL ECHOCARDIOGRAM (TEE) (N/A) APPLICATION OF CELL SAVER ENDOVEIN HARVEST OF GREATER SAPHENOUS VEIN (Right)  1 afeb, VSS sBP 130's-150's, maintaining sinus rhythm 2 sats ok on RA 3 voiding, weight below preop, UOP not fully measured 4 Free T4 elevated , 1.2- doubt hypothyroid but would need further testing - can be done as outpatient if PMD feels necessary 5 BS adeq control but will need stricter diet as outpatient- he is committed to better nutrition 6 creat better, around baseline 7 stable for d/c     LOS: 12 days    John Giovanni PA-C Pager [phone] 11/30/2020

## 2020-11-30 NOTE — Progress Notes (Signed)
Physical Therapy Treatment & Discharge Patient Details Name: Corey Woods MRN: [id number] DOB: 02-21-1945 Today's Date: 11/30/2020   History of Present Illness Pt is 75 yo male admitted on 11/18/20 with chest pain and NSTEMI. S/p CABG x 4 on 11/23. PMH includes CAD, HTN, HLD, CKD 3.   PT Comments    Pt progressing well with mobility, preparing for d/c home today. Pt demonstrates independence with mobility and ADL tasks with good ability to maintain sternal precautions. Reviewed educ. Pt has met short-term acute PT goals, report sno further questions or concerns. Will d/c acute PT.   Recommendations for follow up therapy are one component of a multi-disciplinary discharge planning process, led by the attending physician.  Recommendations may be updated based on patient status, additional functional criteria and insurance authorization.  Follow Up Recommendations  No PT follow up     Assistance Recommended at Discharge Intermittent Supervision/Assistance  Equipment Recommendations  None recommended by PT    Recommendations for Other Services       Precautions / Restrictions Precautions Precautions: Sternal     Mobility  Bed Mobility               General bed mobility comments: Received sitting in recliner; reports no issue getting in/out of flat bed    Transfers Overall transfer level: Independent Equipment used: None Transfers: Sit to/from Stand                  Ambulation/Gait Ambulation/Gait assistance: Independent Social research officer, government (Feet): 240 Feet Assistive device: None Gait Pattern/deviations: Step-through pattern;Decreased stride length Gait velocity: Decreased     General Gait Details: Slow, steady gait indep without DME; pt declined gait training with rollator   Stairs Stairs: Yes Stairs assistance: Modified independent (Device/Increase time) Stair Management: One rail Right;Alternating pattern;Forwards;Backwards Number of Stairs:  11 General stair comments: mod indep with rail support; standing rest break after ascending 11 steps before descent   Wheelchair Mobility    Modified Rankin (Stroke Patients Only)       Balance Overall balance assessment: Independent Sitting-balance support: No upper extremity supported Sitting balance-Leahy Scale: Normal     Standing balance support: No upper extremity supported Standing balance-Leahy Scale: Good               High level balance activites: Side stepping;Backward walking;Direction changes;Turns;Sudden stops;Head turns High Level Balance Comments: No overt instability or LOB with higher level balance tasks            Cognition Arousal/Alertness: Awake/alert Behavior During Therapy: WFL for tasks assessed/performed Overall Cognitive Status: Within Functional Limits for tasks assessed                                          Exercises Other Exercises Other Exercises: incentive spirometer x5 - pt pulling 750-1252m with good technique    General Comments General comments (skin integrity, edema, etc.): Reviewed educ re: sternal prec, incentive spirometer use, activity recommendations, importance of mobility. HR 95 with ambulation, SpO2 94% on RA      Pertinent Vitals/Pain Pain Assessment: No/denies pain Pain Intervention(s): Monitored during session    Home Living                          Prior Function            PT Goals (current goals can  now be found in the care plan section) Progress towards PT goals: Goals met/education completed, patient discharged from PT    Frequency    Min 3X/week      PT Plan Current plan remains appropriate    Co-evaluation              AM-PAC PT "6 Clicks" Mobility   Outcome Measure  Help needed turning from your back to your side while in a flat bed without using bedrails?: None Help needed moving from lying on your back to sitting on the side of a flat bed without  using bedrails?: None Help needed moving to and from a bed to a chair (including a wheelchair)?: None Help needed standing up from a chair using your arms (e.g., wheelchair or bedside chair)?: None Help needed to walk in hospital room?: None Help needed climbing 3-5 steps with a railing? : None 6 Click Score: 24    End of Session   Activity Tolerance: Patient tolerated treatment well Patient left: in chair;with call bell/phone within reach Nurse Communication: Mobility status PT Visit Diagnosis: Other abnormalities of gait and mobility (R26.89)     Time: 2694-8546 PT Time Calculation (min) (ACUTE ONLY): 14 min  Charges:  $Gait Training: 8-22 mins                    Corey Woods, PT, DPT Acute Rehabilitation Services  Pager [phone] Office [phone]  Derry Lory 11/30/2020, 9:55 AM

## 2020-11-30 NOTE — Progress Notes (Signed)
Chest tube sutures removed. Benzoin and steri-strips applied. Sites clean and dry. 

## 2020-12-01 MED FILL — Heparin Sodium (Porcine) Inj 1000 Unit/ML: Qty: 1000 | Status: AC

## 2020-12-01 MED FILL — Lidocaine HCl Local Preservative Free (PF) Inj 2%: INTRAMUSCULAR | Qty: 15 | Status: AC

## 2020-12-01 MED FILL — Potassium Chloride Inj 2 mEq/ML: INTRAVENOUS | Qty: 40 | Status: AC

## 2020-12-01 NOTE — Telephone Encounter (Signed)
**Note De-Identified  Obfuscation** Patient contacted regarding discharge from [facility] on 11/30/2020.  Patient understands to follow up with provider Ermalinda Barrios, PA-c on 12/06/2020 at 12:45 at [address] in [place]. He is also aware of his hosp f/u scheduled with Dr Kipp Brood at [facility] on 12/9. Patient understands discharge instructions? Yes Patient understands medications and regiment? Yes Patient understands to bring all medications to this visit? Yes  Ask patient:  Are you enrolled in My Chart: Yes  Postop Surgical Patients:                What is your wound status? Per the pt "good".  Any signs/ symptoms of infection (Temp, redness/ red streaks, swelling, purulent drainage, foul odor or smell)? Per the pt no.               Please do not place any creams/ lotions/ or antibiotic ointment on any surgical incisions/ wounds without physician approval. The pt verbalized understanding.               Do you have any questions about your medications?  No  All medications (except pain medications) are to be filled by your Cardiologist AFTER your first post op appointment with them.   The pt verbalized understanding.  Are you taking your pain medication? Yes              How is your pain controlled? Per the pt "well".  Pain level? Per the pt he has had very little pain since being at home.   If you require a refill on pain medications, know that the same medication/ amount may not be prescribed or a refill may not be given.  Please contact your pharmacy for refill requests.  The pt verbalized understanding.               Do you have help at home with ADL's? Yes               Please refer to your Pre/post surgery booklet, there is a lot of useful information in it that may answer any questions you may have. The pt verbalized understanding.          Please note that it is ok to remove your surgical dressing, shower (soap/ water), and pat the incision dry. The pt verbalized  understanding.  The pt states that he is doing well at this time with no CP/discomfort, SOB, dizziness nausea, or diaphoresis.  He does have [facility] and [facility] phone numbers to call if he as any questions or concerns. He thanked me for my call.

## 2020-12-01 NOTE — Telephone Encounter (Signed)
Patient was returning a phone call for TCM follow up

## 2020-12-01 NOTE — Telephone Encounter (Signed)
**Note De-Identified  Obfuscation** Transition Care Management Unsuccessful Follow-up Telephone Call  Date of discharge and from where:  11/30/2020 from [facility]  Attempts:  1st Attempt  Reason for unsuccessful TCM follow-up call:  No answer so I left a message on the pts VM asking him to call Jeani Hawking back at [facility] at [phone].

## 2020-12-02 MED FILL — Sodium Bicarbonate IV Soln 8.4%: INTRAVENOUS | Qty: 50 | Status: AC

## 2020-12-02 MED FILL — Electrolyte-R (PH 7.4) Solution: INTRAVENOUS | Qty: 3000 | Status: AC

## 2020-12-02 MED FILL — Heparin Sodium (Porcine) Inj 1000 Unit/ML: INTRAMUSCULAR | Qty: 20 | Status: AC

## 2020-12-02 MED FILL — Mannitol IV Soln 20%: INTRAVENOUS | Qty: 500 | Status: AC

## 2020-12-02 MED FILL — Sodium Chloride IV Soln 0.9%: INTRAVENOUS | Qty: 2000 | Status: AC

## 2020-12-02 MED FILL — Calcium Chloride Inj 10%: INTRAVENOUS | Qty: 10 | Status: AC

## 2020-12-06 ENCOUNTER — Other Ambulatory Visit: Payer: Self-pay

## 2020-12-06 ENCOUNTER — Ambulatory Visit (INDEPENDENT_AMBULATORY_CARE_PROVIDER_SITE_OTHER): Payer: Medicare Other | Admitting: Physician Assistant

## 2020-12-06 ENCOUNTER — Encounter: Payer: Self-pay | Admitting: Physician Assistant

## 2020-12-06 VITALS — BP 118/74 | HR 65 | Ht 70.0 in | Wt 247.2 lb

## 2020-12-06 DIAGNOSIS — I480 Paroxysmal atrial fibrillation: Secondary | ICD-10-CM | POA: Diagnosis not present

## 2020-12-06 DIAGNOSIS — I255 Ischemic cardiomyopathy: Secondary | ICD-10-CM

## 2020-12-06 DIAGNOSIS — N183 Chronic kidney disease, stage 3 unspecified: Secondary | ICD-10-CM | POA: Diagnosis not present

## 2020-12-06 DIAGNOSIS — I25810 Atherosclerosis of coronary artery bypass graft(s) without angina pectoris: Secondary | ICD-10-CM | POA: Diagnosis not present

## 2020-12-06 MED ORDER — ATORVASTATIN CALCIUM 80 MG PO TABS: 80.0000 mg | ORAL_TABLET | Freq: Every day | ORAL | 2 refills | Status: DC

## 2020-12-06 MED ORDER — METOPROLOL TARTRATE 50 MG PO TABS: 50.0000 mg | ORAL_TABLET | Freq: Two times a day (BID) | ORAL | 2 refills | Status: DC

## 2020-12-06 MED ORDER — AMIODARONE HCL 200 MG PO TABS: 200.0000 mg | ORAL_TABLET | Freq: Every day | ORAL | 2 refills | Status: DC

## 2020-12-06 MED ORDER — CLOPIDOGREL BISULFATE 75 MG PO TABS: 75.0000 mg | ORAL_TABLET | Freq: Every day | ORAL | 2 refills | Status: DC

## 2020-12-06 NOTE — Patient Instructions (Signed)
Medication Instructions:   Your physician recommends that you continue on your current medications as directed. Please refer to the Current Medication list given to you today.   ( YOU HAVE BEEN RECOMMENDED TO TAKE BENADRYL UP  25 -50 MG ONCE A DAY AS NEEDED)   *If you need a refill on your cardiac medications before your next appointment, please call your pharmacy*   Lab Work: [place]   If you have labs (blood work) drawn today and your tests are completely normal, you will receive your results only by: [place] (if you have MyChart) OR A paper copy in the mail If you have any lab test that is abnormal or we need to change your treatment, we will call you to review the results.   Testing/Procedures: NONE ORDERED  TODAY   Follow-Up: At [facility], you and your health needs are our priority.  As part of our continuing mission to provide you with exceptional heart care, we have created designated Provider Care Teams.  These Care Teams include your primary Cardiologist (physician) and Advanced Practice Providers (APPs -  Physician Assistants and Nurse Practitioners) who all work together to provide you with the care you need, when you need it.  We recommend signing up for the patient portal called "MyChart".  Sign up information is provided on this After Visit Summary.  MyChart is used to connect with patients for Virtual Visits (Telemedicine).  Patients are able to view lab/test results, encounter notes, upcoming appointments, etc.  Non-urgent messages can be sent to your provider as well.   To learn more about what you can do with MyChart, go to [email or web address].    Your next appointment:   2 month(s)  The format for your next appointment:   In Person  Provider:   You may see Rozann Lesches, MD or one of the following Advanced Practice Providers on your designated Care Team:    Ermalinda Barrios, PA-C     Other  Instructions

## 2020-12-07 DIAGNOSIS — F319 Bipolar disorder, unspecified: Secondary | ICD-10-CM | POA: Diagnosis not present

## 2020-12-07 DIAGNOSIS — F331 Major depressive disorder, recurrent, moderate: Secondary | ICD-10-CM | POA: Diagnosis not present

## 2020-12-07 DIAGNOSIS — J4520 Mild intermittent asthma, uncomplicated: Secondary | ICD-10-CM | POA: Diagnosis not present

## 2020-12-07 DIAGNOSIS — I10 Essential (primary) hypertension: Secondary | ICD-10-CM | POA: Diagnosis not present

## 2020-12-07 DIAGNOSIS — D509 Iron deficiency anemia, unspecified: Secondary | ICD-10-CM | POA: Diagnosis not present

## 2020-12-07 DIAGNOSIS — I2510 Atherosclerotic heart disease of native coronary artery without angina pectoris: Secondary | ICD-10-CM | POA: Diagnosis not present

## 2020-12-07 DIAGNOSIS — E291 Testicular hypofunction: Secondary | ICD-10-CM | POA: Diagnosis not present

## 2020-12-07 DIAGNOSIS — F5221 Male erectile disorder: Secondary | ICD-10-CM | POA: Diagnosis not present

## 2020-12-07 DIAGNOSIS — R0600 Dyspnea, unspecified: Secondary | ICD-10-CM | POA: Diagnosis not present

## 2020-12-07 DIAGNOSIS — E663 Overweight: Secondary | ICD-10-CM | POA: Diagnosis not present

## 2020-12-07 DIAGNOSIS — N1831 Chronic kidney disease, stage 3a: Secondary | ICD-10-CM | POA: Diagnosis not present

## 2020-12-07 DIAGNOSIS — E782 Mixed hyperlipidemia: Secondary | ICD-10-CM | POA: Diagnosis not present

## 2020-12-08 ENCOUNTER — Ambulatory Visit: Payer: Medicare Other | Admitting: Cardiology

## 2020-12-09 ENCOUNTER — Ambulatory Visit (INDEPENDENT_AMBULATORY_CARE_PROVIDER_SITE_OTHER): Payer: Self-pay | Admitting: Thoracic Surgery (Cardiothoracic Vascular Surgery)

## 2020-12-09 ENCOUNTER — Other Ambulatory Visit: Payer: Self-pay

## 2020-12-09 DIAGNOSIS — Z951 Presence of aortocoronary bypass graft: Secondary | ICD-10-CM

## 2020-12-09 MED ORDER — OXYCODONE HCL 5 MG PO TABS: 5.0000 mg | ORAL_TABLET | ORAL | 0 refills | Status: DC | PRN

## 2020-12-09 NOTE — Progress Notes (Signed)
     [address]       [place] [address]             [phone]       Patient: Home Provider: Office Consent for Telemedicine visit obtained.  Today's visit was completed via a real-time telehealth (see specific modality noted below). The patient/authorized person provided oral consent at the time of the visit to engage in a telemedicine encounter with the present provider at [facility]. The patient/authorized person was informed of the potential benefits, limitations, and risks of telemedicine. The patient/authorized person expressed understanding that the laws that protect confidentiality also apply to telemedicine. The patient/authorized person acknowledged understanding that telemedicine does not provide emergency services and that he or she would need to call 911 or proceed to the nearest hospital for help if such a need arose.   Total time spent in the clinical discussion 10 minutes.  Telehealth Modality: Phone visit (audio only)  I had a telephone visit with  Corey Woods who is s/p CABG.  Overall doing well.  Poor sleep.  He is taking melatonin, and benadryl.  Pain is minimal.  Ambulating well. Vitals have been  stable.  Corey Woods will see [place] back in 1 month with a chest x-ray for cardiac rehab clearance.  Corey Woods

## 2020-12-16 ENCOUNTER — Other Ambulatory Visit (HOSPITAL_COMMUNITY): Payer: Self-pay

## 2020-12-18 DIAGNOSIS — I951 Orthostatic hypotension: Secondary | ICD-10-CM

## 2020-12-20 ENCOUNTER — Other Ambulatory Visit (HOSPITAL_COMMUNITY): Payer: Self-pay

## 2020-12-21 ENCOUNTER — Telehealth (HOSPITAL_COMMUNITY): Payer: Self-pay

## 2020-12-21 NOTE — Telephone Encounter (Signed)
Transitions of Care Pharmacy   Call attempted for a pharmacy transitions of care follow-up. HIPAA appropriate voicemail was left with call back information provided.   Call attempt #3. Will no longer attempt follow up for TOC pharmacy.   

## 2020-12-23 NOTE — Telephone Encounter (Signed)
Spoke with pt, he reports that he is only taking 50 mg of metoprolol tartrate once daily. Patient was instructed to stop the metoprolol. He was told to eat a little salt and make sure to drink plenty of fluids. Lab orders placed and the patient will go to [facility] for labs on Tuesday. Aware will forward to michele lenze to make her aware.

## 2020-12-27 DIAGNOSIS — R946 Abnormal results of thyroid function studies: Secondary | ICD-10-CM | POA: Diagnosis not present

## 2020-12-28 DIAGNOSIS — G4700 Insomnia, unspecified: Secondary | ICD-10-CM | POA: Insufficient documentation

## 2020-12-28 DIAGNOSIS — I951 Orthostatic hypotension: Secondary | ICD-10-CM | POA: Diagnosis not present

## 2020-12-28 DIAGNOSIS — E039 Hypothyroidism, unspecified: Secondary | ICD-10-CM | POA: Insufficient documentation

## 2020-12-29 LAB — BASIC METABOLIC PANEL
BUN/Creatinine Ratio: 16 (calc) (ref 6–22)
BUN: 24 mg/dL (ref 7–25)
CO2: 27 mmol/L (ref 20–32)
Calcium: 9.4 mg/dL (ref 8.6–10.3)
Chloride: 99 mmol/L (ref 98–110)
Creat: 1.51 mg/dL — ABNORMAL HIGH (ref 0.70–1.28)
Glucose, Bld: 91 mg/dL (ref 65–99)
Potassium: 4.2 mmol/L (ref 3.5–5.3)
Sodium: 135 mmol/L (ref 135–146)

## 2020-12-29 LAB — CBC
HCT: 43.6 % (ref 38.5–50.0)
Hemoglobin: 14.5 g/dL (ref 13.2–17.1)
MCH: 30.1 pg (ref 27.0–33.0)
MCHC: 33.3 g/dL (ref 32.0–36.0)
MCV: 90.5 fL (ref 80.0–100.0)
MPV: 10.3 fL (ref 7.5–12.5)
Platelets: 239 10*3/uL (ref 140–400)
RBC: 4.82 10*6/uL (ref 4.20–5.80)
RDW: 12.9 % (ref 11.0–15.0)
WBC: 5.9 10*3/uL (ref 3.8–10.8)

## 2021-01-03 ENCOUNTER — Other Ambulatory Visit: Payer: Self-pay | Admitting: Thoracic Surgery (Cardiothoracic Vascular Surgery)

## 2021-01-03 DIAGNOSIS — Z951 Presence of aortocoronary bypass graft: Secondary | ICD-10-CM

## 2021-01-10 ENCOUNTER — Other Ambulatory Visit: Payer: Self-pay

## 2021-01-10 ENCOUNTER — Ambulatory Visit
Admission: RE | Admit: 2021-01-10 | Discharge: 2021-01-10 | Disposition: A | Discharge status: Home / Self Care | Payer: Medicare Other | Source: Ambulatory Visit | Attending: Thoracic Surgery (Cardiothoracic Vascular Surgery) | Admitting: Thoracic Surgery (Cardiothoracic Vascular Surgery)

## 2021-01-10 ENCOUNTER — Ambulatory Visit (INDEPENDENT_AMBULATORY_CARE_PROVIDER_SITE_OTHER): Payer: Self-pay | Admitting: Surgical

## 2021-01-10 VITALS — BP 116/76 | HR 87 | Resp 20 | Ht 70.0 in | Wt 229.0 lb

## 2021-01-10 DIAGNOSIS — Z951 Presence of aortocoronary bypass graft: Secondary | ICD-10-CM

## 2021-01-10 DIAGNOSIS — R918 Other nonspecific abnormal finding of lung field: Secondary | ICD-10-CM | POA: Diagnosis not present

## 2021-01-10 DIAGNOSIS — J90 Pleural effusion, not elsewhere classified: Secondary | ICD-10-CM | POA: Diagnosis not present

## 2021-01-10 NOTE — Patient Instructions (Signed)
Discussed activity progression including driving

## 2021-01-10 NOTE — Progress Notes (Signed)
[address]       [place] [address]             [phone]      Corey Woods [place] Medical Record #[id number] Date of Birth: 1945-04-04  Referring: [place], Peter M, MD Primary Care: Celene Squibb, MD Primary Cardiologist: Rozann Lesches, MD   Chief Complaint:   POST OP FOLLOW UP 11/23/2020 Patient:  Corey Woods Pre-Op Dx: NSTEMI 3V CAD DM HTN   Post-op Dx:  same Procedure: CABG X 4.  LIMA LAD, RSVG PDA, OM2, 2nd Diagonal   Endoscopic greater saphenous vein harvest on the right   History of Present Illness:    Patient is a 76 year old male status post the above described procedure seen in the office on today's date and post routine surgical follow-up.  Overall he feels as though he is making good progress and has started driving.  He does feel as though the amiodarone is causing a fair amount of side effects but he is uncertain for sure if this is the medication involved.  He is having some brain "fog" as well as increased shakiness and imbalance at times.  He is no longer taking any pain medicines.  He is not having any significant chest pain, shortness of breath or palpitations.  He does have some lower extremity edema.  He has no difficulties with his incisions.  He denies fevers, chills or other infectious symptoms.  He does have some constipation which he is managing with over-the-counter medications without significant difficulty.      Past Medical History:  Diagnosis Date   Asthmatic bronchitis    Benign bladder tumor    CAD (coronary artery disease) 2002   BMS x 2 and DES - [place] (multivessel)   Chronic renal failure, stage 3 (moderate), unspecified whether stage 3a or 3b CKD ([place])    Depression    MI (myocardial infarction) ([place]) 2002   Orthostatic hypotension 2002   Prediabetes    Statin intolerance    Crestor - myalgias     Social History   Tobacco Use  Smoking Status Former   Types: Cigarettes   Quit  date: 2002   Years since quitting: 21.0  Smokeless Tobacco Never    Social History   Substance and Sexual Activity  Alcohol Use Yes   Comment: Regularly - out with friends     Allergies  Allergen Reactions   Bee Venom Anaphylaxis   Ciprofloxacin     Current Outpatient Medications  Medication Sig Dispense Refill   albuterol (VENTOLIN HFA) 108 (90 Base) MCG/ACT inhaler albuterol sulfate HFA 90 mcg/actuation aerosol inhaler  Inhale 2 puffs every 4 hours by inhalation route as needed.     amiodarone (PACERONE) 200 MG tablet Take 1 tablet (200 mg total) by mouth daily. 90 tablet 2   aspirin EC 81 MG EC tablet Take 1 tablet (81 mg total) by mouth daily. Swallow whole.     atorvastatin (LIPITOR) 80 MG tablet Take 1 tablet (80 mg total) by mouth daily at 6 PM. 90 tablet 2   buPROPion (WELLBUTRIN XL) 300 MG 24 hr tablet Take 300 mg by mouth daily.     cariprazine (VRAYLAR) 1.5 MG capsule Take 1.5 mg by mouth daily.     clopidogrel (PLAVIX) 75 MG tablet Take 1 tablet (75 mg total) by mouth daily. 90 tablet 2   DULoxetine (CYMBALTA) 60 MG capsule Take 120 mg by mouth daily.  metoprolol tartrate (LOPRESSOR) 50 MG tablet Take 1 tablet (50 mg total) by mouth 2 (two) times daily. 180 tablet 2   montelukast (SINGULAIR) 10 MG tablet Take 10 mg by mouth at bedtime.     oxyCODONE (OXY IR/ROXICODONE) 5 MG immediate release tablet Take 1 tablet (5 mg total) by mouth every 4 (four) hours as needed for severe pain. 50 tablet 0   tamsulosin (FLOMAX) 0.4 MG CAPS capsule Take 0.4 mg by mouth daily.     TESTOSTERONE AQUEOUS IM Inject 400 mg into the muscle See admin instructions. 400 mg once a week every Friday     No current facility-administered medications for this visit.       Physical Exam: Ht 5\' 10"  (1.778 m)    BMI 35.47 kg/m   General appearance: alert, cooperative, and no distress Heart: regular rate and rhythm Lungs: clear to auscultation bilaterally Abdomen: Benign  exam Extremities: Minor lower extremity edema Wound: Incisions well-healed without evidence of infection.   Diagnostic Studies & Laboratory data:     Recent Radiology Findings:   DG Chest 2 View  Result Date: 01/10/2021 CLINICAL DATA:  s/p cabg x 4 EXAM: CHEST - 2 VIEW COMPARISON:  November 28, 2020. FINDINGS: Cardiomediastinal silhouette is within normal limits. CABG and median sternotomy. Improved streaky left basilar opacities. No visible pleural effusions or pneumothorax. IMPRESSION: Resolution of the left pleural effusion with improved mild left basilar opacities, likely atelectasis. Electronically Signed   By: Margaretha Sheffield M.D.   On: 01/10/2021 11:57      Recent Lab Findings: Lab Results  Component Value Date   WBC 5.9 12/28/2020   HGB 14.5 12/28/2020   HCT 43.6 12/28/2020   PLT 239 12/28/2020   GLUCOSE 91 12/28/2020   CHOL 244 (H) 11/19/2020   TRIG 198 (H) 11/19/2020   HDL 59 11/19/2020   LDLCALC 145 (H) 11/19/2020   ALT 18 11/28/2020   AST 20 11/28/2020   NA 135 12/28/2020   K 4.2 12/28/2020   CL 99 12/28/2020   CREATININE 1.51 (H) 12/28/2020   BUN 24 12/28/2020   CO2 27 12/28/2020   TSH 7.437 (H) 11/27/2020   INR 1.3 (H) 11/23/2020   HGBA1C 6.8 (H) 11/19/2020      Assessment / Plan: Overall the patient is doing well.  We discussed discontinuing amiodarone as he feels as though it may be the culprit medication for some of his feeling poorly.  I feel that that was a reasonable request and we will stop at this time.  He does take several other medications that could have some of the side effects.  He reports that he is lost 30 pounds.  He does not check his blood sugars.  I instructed him that he should do this as potentially if blood sugars remain in a good range he may be able to stop metformin.  He has had difficulty in the past with Crestor but feels as though Lipitor is tolerable.  I think it is reasonable to continue that at this time.  He is also on some  other medications for anxiety and depression and is reporting a significant amount of stress as his wife has requested a divorce after 42 years of marriage.  I reviewed his chest x-ray and there are no significant worrisome findings.  We discussed some activity progression and as he is already driving he may continue as long as he does not take any pain medicines.  From a surgical perspective we do not  need to see him further unless there are any new issues that appear.  He can start cardiac rehab.      Medication Changes: No orders of the defined types were placed in this encounter.     John Giovanni, PA-C  01/10/2021 12:03 PM

## 2021-01-11 DIAGNOSIS — G4700 Insomnia, unspecified: Secondary | ICD-10-CM | POA: Diagnosis not present

## 2021-01-25 ENCOUNTER — Other Ambulatory Visit: Payer: Self-pay

## 2021-01-25 ENCOUNTER — Encounter (HOSPITAL_COMMUNITY): Payer: Self-pay

## 2021-01-25 ENCOUNTER — Encounter (HOSPITAL_COMMUNITY)
Admission: RE | Admit: 2021-01-25 | Discharge: 2021-01-25 | Disposition: A | Discharge status: Home / Self Care | Payer: Medicare Other | Source: Ambulatory Visit | Attending: Cardiology | Admitting: Cardiology

## 2021-01-25 VITALS — BP 124/64 | HR 74 | Ht 70.0 in | Wt 232.8 lb

## 2021-01-25 DIAGNOSIS — I214 Non-ST elevation (NSTEMI) myocardial infarction: Secondary | ICD-10-CM | POA: Diagnosis not present

## 2021-01-25 DIAGNOSIS — Z951 Presence of aortocoronary bypass graft: Secondary | ICD-10-CM | POA: Diagnosis not present

## 2021-01-25 NOTE — Progress Notes (Signed)
Cardiac Individual Treatment Plan  Patient Details  Name: Corey Woods MRN: [id number] Date of Birth: 1945-04-19 Referring Provider:   Flowsheet Row CARDIAC REHAB PHASE II ORIENTATION from 01/25/2021 in [place]  Referring Provider [name]       Initial Encounter Date:  Flowsheet Row CARDIAC REHAB PHASE II ORIENTATION from 01/25/2021 in [place]  Date 01/25/21       Visit Diagnosis: NSTEMI (non-ST elevated myocardial infarction) ([place])  S/P CABG x 4  Patient's Home Medications on Admission:  Current Outpatient Medications:    albuterol (VENTOLIN HFA) 108 (90 Base) MCG/ACT inhaler, albuterol sulfate HFA 90 mcg/actuation aerosol inhaler  Inhale 2 puffs every 4 hours by inhalation route as needed., Disp: , Rfl:    amiodarone (PACERONE) 200 MG tablet, Take 1 tablet (200 mg total) by mouth daily., Disp: 90 tablet, Rfl: 2   aspirin EC 81 MG EC tablet, Take 1 tablet (81 mg total) by mouth daily. Swallow whole., Disp: , Rfl:    atorvastatin (LIPITOR) 80 MG tablet, Take 1 tablet (80 mg total) by mouth daily at 6 PM., Disp: 90 tablet, Rfl: 2   buPROPion (WELLBUTRIN XL) 300 MG 24 hr tablet, Take 300 mg by mouth daily., Disp: , Rfl:    clopidogrel (PLAVIX) 75 MG tablet, Take 1 tablet (75 mg total) by mouth daily., Disp: 90 tablet, Rfl: 2   DULoxetine (CYMBALTA) 60 MG capsule, Take 120 mg by mouth daily., Disp: , Rfl:    levothyroxine (SYNTHROID) 25 MCG tablet, Take 25 mcg by mouth daily before breakfast., Disp: , Rfl:    lisinopril (ZESTRIL) 2.5 MG tablet, Take 2.5 mg by mouth daily., Disp: , Rfl:    metFORMIN (GLUCOPHAGE) 500 MG tablet, Take 500 mg by mouth daily., Disp: , Rfl:    metoprolol tartrate (LOPRESSOR) 50 MG tablet, Take 1 tablet (50 mg total) by mouth 2 (two) times daily., Disp: 180 tablet, Rfl: 2   montelukast (SINGULAIR) 10 MG tablet, Take 10 mg by mouth at bedtime., Disp: , Rfl:    tamsulosin (FLOMAX) 0.4 MG CAPS  capsule, Take 0.4 mg by mouth daily., Disp: , Rfl:    TESTOSTERONE AQUEOUS IM, Inject 400 mg into the muscle See admin instructions. 400 mg once a week every Friday, Disp: , Rfl:   Past Medical History: Past Medical History:  Diagnosis Date   Asthmatic bronchitis    Benign bladder tumor    CAD (coronary artery disease) 2002   BMS x 2 and DES - [place] (multivessel)   Chronic renal failure, stage 3 (moderate), unspecified whether stage 3a or 3b CKD ([place])    Depression    MI (myocardial infarction) ([place]) 2002   Orthostatic hypotension 2002   Prediabetes    Statin intolerance    Crestor - myalgias    Tobacco Use: Social History   Tobacco Use  Smoking Status Former   Types: Cigarettes   Quit date: 2002   Years since quitting: 21.0  Smokeless Tobacco Never    Labs: Recent Review Scientist, physiological     Labs for ITP Cardiac and Pulmonary Rehab Latest Ref Rng & Units 11/23/2020 11/23/2020 11/23/2020 11/23/2020 11/27/2020   Cholestrol 0 - 200 mg/dL - - - - -   LDLCALC 0 - 99 mg/dL - - - - -   HDL >40 mg/dL - - - - -   Trlycerides <150 mg/dL - - - - -   Hemoglobin A1c 4.8 - 5.6 % - - - - -  PHART 7.350 - 7.450 7.334(L) 7.284(L) 7.318(L) 7.314(L) 7.486(H)   PCO2ART 32.0 - 48.0 mmHg 37.5 40.6 41.7 41.7 32.0   HCO3 20.0 - 28.0 mmol/L 20.1 19.4(L) 21.4 21.1 23.9   TCO2 22 - 32 mmol/L 21(L) 21(L) 23 22 -   ACIDBASEDEF 0.0 - 2.0 mmol/L 5.0(H) 7.0(H) 5.0(H) 5.0(H) -   O2SAT % 93.0 97.0 96.0 95.0 99.0       Capillary Blood Glucose: Lab Results  Component Value Date   GLUCAP 221 (H) 11/30/2020   GLUCAP 143 (H) 11/30/2020   GLUCAP 173 (H) 11/29/2020   GLUCAP 173 (H) 11/29/2020   GLUCAP 193 (H) 11/29/2020    POCT Glucose     Row Name 01/25/21 1342             POCT Blood Glucose   Pre-Exercise 98 mg/dL                Exercise Target Goals: Exercise Program Goal: Individual exercise prescription set using results from initial 6 min walk test and THRR while  considering  patients activity barriers and safety.   Exercise Prescription Goal: Starting with aerobic activity 30 plus minutes a day, 3 days per week for initial exercise prescription. Provide home exercise prescription and guidelines that participant acknowledges understanding prior to discharge.  Activity Barriers & Risk Stratification:  Activity Barriers & Cardiac Risk Stratification - 01/25/21 1312       Activity Barriers & Cardiac Risk Stratification   Activity Barriers Arthritis;Back Problems;Deconditioning;Balance Concerns    Cardiac Risk Stratification High             6 Minute Walk:  6 Minute Walk     Row Name 01/25/21 1406         6 Minute Walk   Phase Initial     Distance 1200 feet     Walk Time 6 minutes     # of Rest Breaks 0     MPH 2.3     METS 2.27     RPE 12     VO2 Peak 7.96     Symptoms Yes (comment)     Comments slight lower back discomfort     Resting HR 74 bpm     Resting BP 124/64     Resting Oxygen Saturation  97 %     Exercise Oxygen Saturation  during 6 min walk 96 %     Max Ex. HR 97 bpm     Max Ex. BP 140/60     2 Minute Post BP 128/60              Oxygen Initial Assessment:   Oxygen Re-Evaluation:   Oxygen Discharge (Final Oxygen Re-Evaluation):   Initial Exercise Prescription:  Initial Exercise Prescription - 01/25/21 1400       Date of Initial Exercise RX and Referring Provider   Date 01/25/21    Referring Provider [name]    Expected Discharge Date 04/14/21      NuStep   Level 1    SPM 60    Minutes 22      Arm Ergometer   Level 1    RPM 50    Minutes 17      Prescription Details   Frequency (times per week) 3    Duration Progress to 30 minutes of continuous aerobic without signs/symptoms of physical distress      Intensity   THRR 40-80% of Max Heartrate 59-118    Ratings of Perceived Exertion  11-13    Perceived Dyspnea 0-4      Resistance Training   Training Prescription Yes    Weight 3     Reps 10-15             Perform Capillary Blood Glucose checks as needed.  Exercise Prescription Changes:   Exercise Comments:   Exercise Goals and Review:   Exercise Goals     Row Name 01/25/21 1410             Exercise Goals   Increase Physical Activity Yes       Intervention Provide advice, education, support and counseling about physical activity/exercise needs.;Develop an individualized exercise prescription for aerobic and resistive training based on initial evaluation findings, risk stratification, comorbidities and participant's personal goals.       Expected Outcomes Short Term: Attend rehab on a regular basis to increase amount of physical activity.;Long Term: Exercising regularly at least 3-5 days a week.;Long Term: Add in home exercise to make exercise part of routine and to increase amount of physical activity.       Increase Strength and Stamina Yes       Intervention Provide advice, education, support and counseling about physical activity/exercise needs.;Develop an individualized exercise prescription for aerobic and resistive training based on initial evaluation findings, risk stratification, comorbidities and participant's personal goals.       Expected Outcomes Short Term: Increase workloads from initial exercise prescription for resistance, speed, and METs.;Short Term: Perform resistance training exercises routinely during rehab and add in resistance training at home;Long Term: Improve cardiorespiratory fitness, muscular endurance and strength as measured by increased METs and functional capacity (6MWT)       Able to understand and use rate of perceived exertion (RPE) scale Yes       Intervention Provide education and explanation on how to use RPE scale       Expected Outcomes Long Term:  Able to use RPE to guide intensity level when exercising independently;Short Term: Able to use RPE daily in rehab to express subjective intensity level       Knowledge and  understanding of Target Heart Rate Range (THRR) Yes       Intervention Provide education and explanation of THRR including how the numbers were predicted and where they are located for reference       Expected Outcomes Short Term: Able to state/look up THRR;Long Term: Able to use THRR to govern intensity when exercising independently;Short Term: Able to use daily as guideline for intensity in rehab       Able to check pulse independently Yes       Intervention Provide education and demonstration on how to check pulse in carotid and radial arteries.;Review the importance of being able to check your own pulse for safety during independent exercise       Expected Outcomes Short Term: Able to explain why pulse checking is important during independent exercise       Understanding of Exercise Prescription Yes       Intervention Provide education, explanation, and written materials on patient's individual exercise prescription       Expected Outcomes Short Term: Able to explain program exercise prescription;Long Term: Able to explain home exercise prescription to exercise independently                Exercise Goals Re-Evaluation :    Discharge Exercise Prescription (Final Exercise Prescription Changes):   Nutrition:  Target Goals: Understanding of nutrition guidelines, daily intake of  sodium 1500mg , cholesterol 200mg , calories 30% from fat and 7% or less from saturated fats, daily to have 5 or more servings of fruits and vegetables.  Biometrics:  Pre Biometrics - 01/25/21 1407       Pre Biometrics   Height 5\' 10"  (1.778 m)    Weight 232 lb 12.9 oz (105.6 kg)    Waist Circumference 45 inches    Hip Circumference 42 inches    Waist to Hip Ratio 1.07 %    BMI (Calculated) 33.4    Triceps Skinfold 12 mm    % Body Fat 31 %    Grip Strength 37.3 kg    Flexibility 0 in    Single Leg Stand 2.5 seconds              Nutrition Therapy Plan and Nutrition Goals:  Nutrition Therapy &  Goals - 01/25/21 1319       Intervention Plan   Intervention Prescribe, educate and counsel regarding individualized specific dietary modifications aiming towards targeted core components such as weight, hypertension, lipid management, diabetes, heart failure and other comorbidities.;Nutrition handout(s) given to patient.    Expected Outcomes Short Term Goal: Understand basic principles of dietary content, such as calories, fat, sodium, cholesterol and nutrients.;Long Term Goal: Adherence to prescribed nutrition plan.             Nutrition Assessments:  Nutrition Assessments - 01/25/21 1316       MEDFICTS Scores   Pre Score 20            MEDIFICTS Score Key: ?70 Need to make dietary changes  40-70 Heart Healthy Diet ? 40 Therapeutic Level Cholesterol Diet   Picture Your Plate Scores: <01 Unhealthy dietary pattern with much room for improvement. 41-50 Dietary pattern unlikely to meet recommendations for good health and room for improvement. 51-60 More healthful dietary pattern, with some room for improvement.  >60 Healthy dietary pattern, although there may be some specific behaviors that could be improved.    Nutrition Goals Re-Evaluation:   Nutrition Goals Discharge (Final Nutrition Goals Re-Evaluation):   Psychosocial: Target Goals: Acknowledge presence or absence of significant depression and/or stress, maximize coping skills, provide positive support system. Participant is able to verbalize types and ability to use techniques and skills needed for reducing stress and depression.  Initial Review & Psychosocial Screening:  Initial Psych Review & Screening - 01/25/21 1313       Initial Review   Current issues with Current Depression   He is treated with Wellbutrin 300 mg daily and cymbalta 60 mg bid for depression. He reports that this helps tremendously. He formerly took [employer] fast streamer 1.5 mg daily for bi polar disorder but quit due to the cost. He reports no current  problems BPD.     Family Dynamics   Good Support System? Yes    Comments His wife is his support system.      Barriers   Psychosocial barriers to participate in program The patient should benefit from training in stress management and relaxation.      Screening Interventions   Interventions Encouraged to exercise;Provide feedback about the scores to participant    Expected Outcomes Long Term goal: The participant improves quality of Life and PHQ9 Scores as seen by post scores and/or verbalization of changes;Short Term goal: Identification and review with participant of any Quality of Life or Depression concerns found by scoring the questionnaire.             Quality of Life  Scores:  Quality of Life - 01/25/21 1410       Quality of Life   Select Quality of Life      Quality of Life Scores   Health/Function Pre 16.86 %    Socioeconomic Pre 30 %    Psych/Spiritual Pre 15 %    Family Pre 30 %    GLOBAL Pre 20.21 %            Scores of 19 and below usually indicate a poorer quality of life in these areas.  A difference of  2-3 points is a clinically meaningful difference.  A difference of 2-3 points in the total score of the Quality of Life Index has been associated with significant improvement in overall quality of life, self-image, physical symptoms, and general health in studies assessing change in quality of life.  PHQ-9: Recent Review Flowsheet Data     Depression screen [facility] 2/9 01/25/2021   Decreased Interest 0   Down, Depressed, Hopeless 0   PHQ - 2 Score 0   Altered sleeping 0   Tired, decreased energy 2   Change in appetite 0   Feeling bad or failure about yourself  0   Trouble concentrating 3   Moving slowly or fidgety/restless 0   Suicidal thoughts 0   PHQ-9 Score 5   Difficult doing work/chores Extremely dIfficult       Interpretation of Total Score  Total Score Depression Severity:  1-4 = Minimal depression, 5-9 = Mild depression, 10-14 = Moderate  depression, 15-19 = Moderately severe depression, 20-27 = Severe depression   Psychosocial Evaluation and Intervention:  Psychosocial Evaluation - 01/25/21 1406       Psychosocial Evaluation & Interventions   Interventions Stress management education;Relaxation education;Encouraged to exercise with the program and follow exercise prescription    Comments Pt has no barriers to participating in rehab. He does have depression and is treated with Wellbutrin 300 mg daily and Cymbalta 60 mg bid. He reports that the is helps and he currently is having no issues with his depression. He also has Bi Polar Disorder. He was previously prescribed Braylar 1.5 mg daily, but quit taking it about two months ago due to the cost. He reports no current issues with his Bi Polar Disorder. He has no other identifiable psychosocial issues. His wife had previously asked for a divorce after 40 years, and he was having issues a few months ago, but they have since reconciled. He scored a 5 on his PHQ-9. He relates this to having symptomatic orthostatic hypotension and having problems with his concentration and memory. He attributes this to taking Amiodarone. He quit taking this two weeks ago, so he is hopeful that these symtoms will go away. He reports that he has a good support system with his wife. His goals for the program are to lose weight and to improve his physical fitness. He has already lost 30 lbs since his CABG, and he would long term like to lose 30 more. He is eager to start the program.    Expected Outcomes Pt's depression will continue to be treated and he will have no other identifiable psychosocial issues.    Continue Psychosocial Services  No Follow up required             Psychosocial Re-Evaluation:   Psychosocial Discharge (Final Psychosocial Re-Evaluation):   Vocational Rehabilitation: Provide vocational rehab assistance to qualifying candidates.   Vocational Rehab Evaluation & Intervention:   Vocational Rehab - 01/25/21 1325  Initial Vocational Rehab Evaluation & Intervention   Assessment shows need for Vocational Rehabilitation No             Education: Education Goals: Education classes will be provided on a weekly basis, covering required topics. Participant will state understanding/return demonstration of topics presented.  Learning Barriers/Preferences:  Learning Barriers/Preferences - 01/25/21 1320       Learning Barriers/Preferences   Learning Barriers None    Learning Preferences Individual Instruction;Skilled Demonstration             Education Topics: Hypertension, Hypertension Reduction -Define heart disease and high blood pressure. Discus how high blood pressure affects the body and ways to reduce high blood pressure.   Exercise and Your Heart -Discuss why it is important to exercise, the FITT principles of exercise, normal and abnormal responses to exercise, and how to exercise safely.   Angina -Discuss definition of angina, causes of angina, treatment of angina, and how to decrease risk of having angina.   Cardiac Medications -Review what the following cardiac medications are used for, how they affect the body, and side effects that may occur when taking the medications.  Medications include Aspirin, Beta blockers, calcium channel blockers, ACE Inhibitors, angiotensin receptor blockers, diuretics, digoxin, and antihyperlipidemics.   Congestive Heart Failure -Discuss the definition of CHF, how to live with CHF, the signs and symptoms of CHF, and how keep track of weight and sodium intake.   Heart Disease and Intimacy -Discus the effect sexual activity has on the heart, how changes occur during intimacy as we age, and safety during sexual activity.   Smoking Cessation / COPD -Discuss different methods to quit smoking, the health benefits of quitting smoking, and the definition of COPD.   Nutrition I: Fats -Discuss the types of  cholesterol, what cholesterol does to the heart, and how cholesterol levels can be controlled.   Nutrition II: Labels -Discuss the different components of food labels and how to read food label   Heart Parts/Heart Disease and PAD -Discuss the anatomy of the heart, the pathway of blood circulation through the heart, and these are affected by heart disease.   Stress I: Signs and Symptoms -Discuss the causes of stress, how stress may lead to anxiety and depression, and ways to limit stress.   Stress II: Relaxation -Discuss different types of relaxation techniques to limit stress.   Warning Signs of Stroke / TIA -Discuss definition of a stroke, what the signs and symptoms are of a stroke, and how to identify when someone is having stroke.   Knowledge Questionnaire Score:  Knowledge Questionnaire Score - 01/25/21 1323       Knowledge Questionnaire Score   Pre Score 23/24             Core Components/Risk Factors/Patient Goals at Admission:  Personal Goals and Risk Factors at Admission - 01/25/21 1325       Core Components/Risk Factors/Patient Goals on Admission    Weight Management Yes;Obesity;Weight Loss    Intervention Weight Management: Develop a combined nutrition and exercise program designed to reach desired caloric intake, while maintaining appropriate intake of nutrient and fiber, sodium and fats, and appropriate energy expenditure required for the weight goal.;Weight Management: Provide education and appropriate resources to help participant work on and attain dietary goals.;Weight Management/Obesity: Establish reasonable short term and long term weight goals.;Obesity: Provide education and appropriate resources to help participant work on and attain dietary goals.    Expected Outcomes Short Term: Continue to assess and modify  interventions until short term weight is achieved;Long Term: Adherence to nutrition and physical activity/exercise program aimed toward attainment  of established weight goal;Weight Maintenance: Understanding of the daily nutrition guidelines, which includes 25-35% calories from fat, 7% or less cal from saturated fats, less than 200mg  cholesterol, less than 1.5gm of sodium, & 5 or more servings of fruits and vegetables daily;Weight Loss: Understanding of general recommendations for a balanced deficit meal plan, which promotes 1-2 lb weight loss per week and includes a negative energy balance of [phone] kcal/d;Understanding recommendations for meals to include 15-35% energy as protein, 25-35% energy from fat, 35-60% energy from carbohydrates, less than 200mg  of dietary cholesterol, 20-35 gm of total fiber daily;Understanding of distribution of calorie intake throughout the day with the consumption of 4-5 meals/snacks    Personal Goal Other Yes    Personal Goal Improve physical fitness    Intervention Attend cardiac rehab and begin a home exercise program.    Expected Outcomes Pt will improve his physical fitness.             Core Components/Risk Factors/Patient Goals Review:    Core Components/Risk Factors/Patient Goals at Discharge (Final Review):    ITP Comments:   Comments: Patient arrived for 1st visit/orientation/education at 1230. Patient was referred to CR by [name] due to NSTEMI (I21.4) and CABG x 4 (Z95.1). During orientation advised patient on arrival and appointment times what to wear, what to do before, during and after exercise. Reviewed attendance and class policy.  Pt is scheduled to return Cardiac Rehab on 01/27/2021 at 1100. Pt was advised to come to class 15 minutes before class starts.  Discussed RPE/Dpysnea scales. Patient participated in warm up stretches. Patient was able to complete 6 minute walk test.  Telemetry:NSR. Patient was measured for the equipment. Discussed equipment safety with patient. Took patient pre-anthropometric measurements. Patient finished visit at 1400.

## 2021-01-27 ENCOUNTER — Encounter (HOSPITAL_COMMUNITY)
Admission: RE | Admit: 2021-01-27 | Discharge: 2021-01-27 | Disposition: A | Discharge status: Home / Self Care | Payer: Medicare Other | Source: Ambulatory Visit | Attending: Cardiology | Admitting: Cardiology

## 2021-01-27 DIAGNOSIS — I214 Non-ST elevation (NSTEMI) myocardial infarction: Secondary | ICD-10-CM | POA: Diagnosis not present

## 2021-01-27 DIAGNOSIS — Z951 Presence of aortocoronary bypass graft: Secondary | ICD-10-CM

## 2021-01-27 NOTE — Progress Notes (Signed)
Daily Session Note  Patient Details  Name: Argie Applegate MRN: [id number] Date of Birth: [birth year] Referring Provider:   Flowsheet Row CARDIAC REHAB PHASE II ORIENTATION from 01/25/2021 in [place]  Referring Provider [name]       Encounter Date: 01/27/2021  Check In:  Session Check In - 01/27/21 1100       Check-In   Supervising physician immediately available to respond to emergencies [facility] MD immediately available    Physician(s) [name]    Location [facility]    Staff Present Hoy Register, MS, ACSM-CEP, Exercise Physiologist;Heather Otho Ket, BS, Exercise Physiologist;Bellanie Matthew Wynetta Emery, RN, BSN;Other    Virtual Visit No    Medication changes reported     No    Fall or balance concerns reported    Yes    Comments He has Orthostatic hypotension and gets dizzy frequently. He has not fallen though.    Tobacco Cessation No Change    Warm-up and Cool-down Performed as group-led instruction    Resistance Training Performed Yes    VAD Patient? No    PAD/SET Patient? No      Pain Assessment   Currently in Pain? No/denies    Multiple Pain Sites No             Capillary Blood Glucose: No results found for this or any previous visit (from the past 24 hour(s)).    Social History   Tobacco Use  Smoking Status Former   Types: Cigarettes   Quit date: 2002   Years since quitting: 21.0  Smokeless Tobacco Never    Goals Met:  [place] with exercise equipment Exercise tolerated well No report of concerns or symptoms today Strength training completed today  Goals Unmet:  Not Applicable  Comments: Check out 1200.   Dr. Kathie Dike is Medical Director for [facility].

## 2021-01-30 ENCOUNTER — Encounter (HOSPITAL_COMMUNITY)
Admission: RE | Admit: 2021-01-30 | Discharge: 2021-01-30 | Disposition: A | Discharge status: Home / Self Care | Payer: Medicare Other | Source: Ambulatory Visit | Attending: Cardiology | Admitting: Cardiology

## 2021-01-30 VITALS — Wt 235.5 lb

## 2021-01-30 DIAGNOSIS — Z951 Presence of aortocoronary bypass graft: Secondary | ICD-10-CM

## 2021-01-30 DIAGNOSIS — I214 Non-ST elevation (NSTEMI) myocardial infarction: Secondary | ICD-10-CM | POA: Diagnosis not present

## 2021-01-30 NOTE — Progress Notes (Signed)
Daily Session Note  Patient Details  Name: Buzz Axel MRN: [id number] Date of Birth: [birth year] Referring Provider:   Flowsheet Row CARDIAC REHAB PHASE II ORIENTATION from 01/25/2021 in [place]  Referring Provider [name]       Encounter Date: 01/30/2021  Check In:  Session Check In - 01/30/21 1100       Check-In   Supervising physician immediately available to respond to emergencies [facility] MD immediately available    Physician(s) Dr.Pemberton    Location [facility]    Staff Present Hoy Register, MS, ACSM-CEP, Exercise Physiologist;Debra Wynetta Emery, RN, BSN;Other;Brandii Lakey, RN    Virtual Visit No    Medication changes reported     No    Fall or balance concerns reported    Yes    Comments He has Orthostatic hypotension and gets dizzy frequently. He has not fallen though.    Tobacco Cessation No Change    Warm-up and Cool-down Performed as group-led instruction    Resistance Training Performed Yes    VAD Patient? No    PAD/SET Patient? No      Pain Assessment   Currently in Pain? No/denies    Multiple Pain Sites No             Capillary Blood Glucose: No results found for this or any previous visit (from the past 24 hour(s)).    Social History   Tobacco Use  Smoking Status Former   Types: Cigarettes   Quit date: 2002   Years since quitting: 21.0  Smokeless Tobacco Never    Goals Met:  Independence with exercise equipment Exercise tolerated well No report of concerns or symptoms today Strength training completed today  Goals Unmet:  Not Applicable  Comments: checkout @ 12:00pm   Dr. Kathie Dike is Medical Director for [facility].

## 2021-01-30 NOTE — Progress Notes (Deleted)
Cardiology Office Note    Date:  01/30/2021   ID:  Corey Woods [birth year], MRN [id number]   PCP:  Celene Squibb, [place]  Cardiologist:  Rozann Lesches, MD *** Advanced Practice Provider:  No care team member to display Electrophysiologist:  None   [id number]   No chief complaint on file.   History of Present Illness:  Corey Woods is a 76 y.o. male with CAD, HTN, HLD, elevated blood sugar and CKD stage 3 admitted with chest pain, troponin elevation c/w a NSTEMI.  Patient taken for CABG 11/23/2020.  LVEF 40 to 45% on preop echo, postop improved with no wall motion abnormalities noted but no EF given.  Not started on ACE/ARB/MRA with CKD stage III.  Creatinine 1.67.   I saw the patient 12/06/20 and was wheezing a lot, not using his inhalers. Plan to stop Amio after 2 months and f/u echo 3 months.     Past Medical History:  Diagnosis Date   Asthmatic bronchitis    Benign bladder tumor    CAD (coronary artery disease) 2002   BMS x 2 and DES - [place] (multivessel)   Chronic renal failure, stage 3 (moderate), unspecified whether stage 3a or 3b CKD (HCC)    Depression    MI (myocardial infarction) ([place]) 2002   Orthostatic hypotension 2002   Prediabetes    Statin intolerance    Crestor - myalgias    Past Surgical History:  Procedure Laterality Date   CORONARY ANGIOPLASTY WITH STENT PLACEMENT     CORONARY ARTERY BYPASS GRAFT N/A 11/23/2020   Procedure: CORONARY ARTERY BYPASS GRAFTING (CABG) X FOUR, ON PUMP, USING LEFT INTERNAL MAMMARY ARTERY AND ENDOSCOPIALLY HARVESTED RIGHT GREATER SAPHENOUS VEIN CONDUITS;  Surgeon: Lajuana Matte, MD;  Location: [facility];  Service: Open Heart Surgery;  Laterality: N/A;   ENDOVEIN HARVEST OF GREATER SAPHENOUS VEIN Right 11/23/2020   Procedure: ENDOVEIN HARVEST OF GREATER SAPHENOUS VEIN;  Surgeon: Lajuana Matte, MD;  Location: [facility];  Service: Open Heart Surgery;  Laterality:  Right;   FOOT SURGERY     LEFT HEART CATH AND CORONARY ANGIOGRAPHY N/A 11/21/2020   Procedure: LEFT HEART CATH AND CORONARY ANGIOGRAPHY;  Surgeon: Martinique, Peter M, MD;  Location: [facility];  Service: Cardiovascular;  Laterality: N/A;   TEE WITHOUT CARDIOVERSION N/A 11/23/2020   Procedure: TRANSESOPHAGEAL ECHOCARDIOGRAM (TEE);  Surgeon: Lajuana Matte, MD;  Location: [facility];  Service: Open Heart Surgery;  Laterality: N/A;   TONSILLECTOMY      Current Medications: No outpatient medications have been marked as taking for the 02/06/21 encounter (Appointment) with Imogene Burn, PA-C.     Allergies:   Bee venom and Ciprofloxacin   Social History   Socioeconomic History   Marital status: Married    Spouse name: Not on file   Number of children: Not on file   Years of education: Not on file   Highest education level: Not on file  Occupational History   Not on file  Tobacco Use   Smoking status: Former    Types: Cigarettes    Quit date: 2002    Years since quitting: 21.0   Smokeless tobacco: Never  Substance and Sexual Activity   Alcohol use: Yes    Comment: Regularly - out with friends   Drug use: Never   Sexual activity: Not on file  Other Topics Concern   Not on file  Social  History Narrative   Not on file   Social Determinants of Health   Financial Resource Strain: Not on file  Food Insecurity: Not on file  Transportation Needs: Not on file  Physical Activity: Not on file  Stress: Not on file  Social Connections: Not on file     Family History:  The patient's ***family history includes Heart disease in his father; Leukemia in his mother; Stroke in his father.   ROS:   Please see the history of present illness.    ROS All other systems reviewed and are negative.   PHYSICAL EXAM:   VS:  There were no vitals taken for this visit.  Physical Exam  GEN: Well nourished, well developed, in no acute distress  HEENT: normal  Neck: no JVD, carotid  bruits, or masses Cardiac:RRR; no murmurs, rubs, or gallops  Respiratory:  clear to auscultation bilaterally, normal work of breathing GI: soft, nontender, nondistended, + BS Ext: without cyanosis, clubbing, or edema, Good distal pulses bilaterally MS: no deformity or atrophy  Skin: warm and dry, no rash Neuro:  Alert and Oriented x 3, Strength and sensation are intact Psych: euthymic mood, full affect  Wt Readings from Last 3 Encounters:  01/25/21 232 lb 12.9 oz (105.6 kg)  01/10/21 229 lb (103.9 kg)  12/06/20 247 lb 3.2 oz (112.1 kg)      Studies/Labs Reviewed:   EKG:  EKG is*** ordered today.  The ekg ordered today demonstrates ***  Recent Labs: 11/24/2020: Magnesium 2.5 11/27/2020: TSH 7.437 11/28/2020: ALT 18 12/28/2020: BUN 24; Creat 1.51; Hemoglobin 14.5; Platelets 239; Potassium 4.2; Sodium 135   Lipid Panel    Component Value Date/Time   CHOL 244 (H) 11/19/2020 0021   TRIG 198 (H) 11/19/2020 0021   HDL 59 11/19/2020 0021   CHOLHDL 4.1 11/19/2020 0021   VLDL 40 11/19/2020 0021   LDLCALC 145 (H) 11/19/2020 0021    Additional studies/ records that were reviewed today include:  TEE 11/23/2020 POST-OP IMPRESSIONS     s/p CABG x 4  _ Left Ventricle: LVEF improved. CO > 5 L/min, CI > 2.5 L/min/m. No RWMA's  noted.  _ Right Ventricle: The right ventricle appears unchanged from pre-bypass.  _ Aorta: Aorta unchanged, no dissection noted after removal of the  cannula.  _ Left Atrium: The left atrium appears unchanged from pre-bypass.  _ Left Atrial Appendage: The left atrial appendage appears unchanged from  pre-bypass.  _ Aortic Valve: The aortic valve appears unchanged from pre-bypass.  _ Mitral Valve: The mitral valve appears unchanged from pre-bypass.  _ Tricuspid Valve: The tricuspid valve appears unchanged from pre-bypass.  _ Pulmonic Valve: The pulmonic valve appears unchanged from pre-bypass.  _ Interatrial Septum: The interatrial septum appears unchanged  from  pre-bypass.  _ Interventricular Septum: The interventricular septum appears unchanged  from  pre-bypass.  _ Pericardium: The pericardium appears unchanged from pre-bypass.   PRE-OP FINDINGS   Left Ventricle: The left ventricle has mild-moderately reduced systolic  function, with an ejection fraction of 40-45%. The cavity size was normal.  Left ventrical global hypokinesis without regional wall motion  abnormalities. There is no left ventricular   hypertrophy. Left ventricular diastolic function could not be evaluated.   Right Ventricle: The right ventricle has slightly reduced systolic  function. The cavity was normal. There is no increase in right ventricular  wall thickness. There is no aneurysm seen.   Left Atrium: Left atrial size was normal in size. No left atrial/left  atrial  appendage thrombus was detected. The left atrial appendage is well  visualized and there is no evidence of thrombus present. Left atrial  appendage velocity is normal at greater  than 40 cm/s.   Right Atrium: Right atrial size was normal in size.   Interatrial Septum: No atrial level shunt detected by color flow Doppler.  There is no evidence of a patent foramen ovale.   Pericardium: There is no evidence of pericardial effusion. There is no  pleural effusion.   Mitral Valve: The mitral valve is normal in structure with annular  calcification noted. Mitral valve regurgitation is mild by color flow  Doppler. The MR jet is centrally-directed. There is no evidence of mitral  valve vegetation. There is no evidence of  mitral stenosis.   Tricuspid Valve: The tricuspid valve was normal in structure. Tricuspid  valve regurgitation was not visualized by color flow Doppler. No evidence  of tricuspid stenosis is present. There is no evidence of tricuspid valve  vegetation.   Aortic Valve: The aortic valve is tricuspid Aortic valve regurgitation was  not visualized by color flow Doppler. There is no  stenosis of the aortic  valve. There is no evidence of aortic valve vegetation.   Pulmonic Valve: The pulmonic valve was normal in structure No evidence of  pumonic stenosis.  Pulmonic valve regurgitation is trivial by color flow Doppler.    Aorta: The aortic root, ascending aorta and aortic arch are normal in size  and structure.   Pulmonary Artery: The pulmonary artery is of normal size.   Venous: The inferior vena cava was not well visualized.   Shunts: There is no evidence of an atrial septal defect.    Cardiac cath 11/21/2020   Prox LAD to Mid LAD lesion is 95% stenosed.   Mid LAD lesion is 50% stenosed.   Dist LAD lesion is 80% stenosed.   1st Diag lesion is 90% stenosed.   Mid Cx to Dist Cx lesion is 80% stenosed.   2nd Mrg lesion is 75% stenosed.   3rd Mrg lesion is 75% stenosed.   Prox RCA to Mid RCA lesion is 90% stenosed.   Mid RCA to Dist RCA lesion is 100% stenosed.   LV end diastolic pressure is normal.   Severe 3 vessel obstructive CAD Normal LVEDP   Plan: CT surgery consult for CABG   Risk Assessment/Calculations:   {Does this patient have ATRIAL FIBRILLATION?:[phone]}     ASSESSMENT:    No diagnosis found.   PLAN:  In order of problems listed above:  CAD status post CABG 11/23/2020 Will f/u with [name] in [place] on ASA, Plavix, lipitor and metoprolol. Trouble with insomnia-can take benadryl and increase activity.   Post op Afib in NSR on amiodarone-plan to stop after 2 months.    Ischemic cardiomyopathy on beta-blocker.  ACE/ARB/MRA held early postop because of CKD stage III-can check echo in 3 months   CKD stage III Crt 1.67 11/30/20  Shared Decision Making/Informed Consent   {Are you ordering a CV Procedure (e.g. stress test, cath, DCCV, TEE, etc)?   Press F2        :[id number]}    Medication Adjustments/Labs and Tests Ordered: Current medicines are reviewed at length with the patient today.  Concerns regarding medicines are  outlined above.  Medication changes, Labs and Tests ordered today are listed in the Patient Instructions below. There are no Patient Instructions on file for this visit.   Sumner Boast, PA-C  01/30/2021 12:33 PM  [facility] [place], [place], [place]  [address] Phone: [phone]; Fax: [phone]

## 2021-02-01 ENCOUNTER — Encounter (HOSPITAL_COMMUNITY)
Admission: RE | Admit: 2021-02-01 | Discharge: 2021-02-01 | Disposition: A | Discharge status: Home / Self Care | Payer: Medicare Other | Source: Ambulatory Visit | Attending: Cardiology | Admitting: Cardiology

## 2021-02-01 DIAGNOSIS — J95821 Acute postprocedural respiratory failure: Secondary | ICD-10-CM | POA: Diagnosis not present

## 2021-02-01 DIAGNOSIS — I2510 Atherosclerotic heart disease of native coronary artery without angina pectoris: Secondary | ICD-10-CM | POA: Insufficient documentation

## 2021-02-01 DIAGNOSIS — R7303 Prediabetes: Secondary | ICD-10-CM | POA: Insufficient documentation

## 2021-02-01 DIAGNOSIS — I214 Non-ST elevation (NSTEMI) myocardial infarction: Secondary | ICD-10-CM | POA: Diagnosis not present

## 2021-02-01 DIAGNOSIS — I252 Old myocardial infarction: Secondary | ICD-10-CM | POA: Diagnosis not present

## 2021-02-01 DIAGNOSIS — Z951 Presence of aortocoronary bypass graft: Secondary | ICD-10-CM | POA: Insufficient documentation

## 2021-02-01 NOTE — Progress Notes (Signed)
Cardiac Individual Treatment Plan  Patient Details  Name: Corey Woods MRN: [id number] Date of Birth: [birth year] Referring Provider:   Flowsheet Row CARDIAC REHAB PHASE II ORIENTATION from 01/25/2021 in [facility]  Referring Provider [name]       Initial Encounter Date:  Flowsheet Row CARDIAC REHAB PHASE II ORIENTATION from 01/25/2021 in [facility]  Date 01/25/21       Visit Diagnosis: NSTEMI (non-ST elevated myocardial infarction) ([place])  S/P CABG x 4  Patient's Home Medications on Admission:  Current Outpatient Medications:    albuterol (VENTOLIN HFA) 108 (90 Base) MCG/ACT inhaler, albuterol sulfate HFA 90 mcg/actuation aerosol inhaler  Inhale 2 puffs every 4 hours by inhalation route as needed., Disp: , Rfl:    amiodarone (PACERONE) 200 MG tablet, Take 1 tablet (200 mg total) by mouth daily., Disp: 90 tablet, Rfl: 2   aspirin EC 81 MG EC tablet, Take 1 tablet (81 mg total) by mouth daily. Swallow whole., Disp: , Rfl:    atorvastatin (LIPITOR) 80 MG tablet, Take 1 tablet (80 mg total) by mouth daily at 6 PM., Disp: 90 tablet, Rfl: 2   buPROPion (WELLBUTRIN XL) 300 MG 24 hr tablet, Take 300 mg by mouth daily., Disp: , Rfl:    clopidogrel (PLAVIX) 75 MG tablet, Take 1 tablet (75 mg total) by mouth daily., Disp: 90 tablet, Rfl: 2   DULoxetine (CYMBALTA) 60 MG capsule, Take 120 mg by mouth daily., Disp: , Rfl:    levothyroxine (SYNTHROID) 25 MCG tablet, Take 25 mcg by mouth daily before breakfast., Disp: , Rfl:    lisinopril (ZESTRIL) 2.5 MG tablet, Take 2.5 mg by mouth daily., Disp: , Rfl:    metFORMIN (GLUCOPHAGE) 500 MG tablet, Take 500 mg by mouth daily., Disp: , Rfl:    metoprolol tartrate (LOPRESSOR) 50 MG tablet, Take 1 tablet (50 mg total) by mouth 2 (two) times daily., Disp: 180 tablet, Rfl: 2   montelukast (SINGULAIR) 10 MG tablet, Take 10 mg by mouth at bedtime., Disp: , Rfl:    tamsulosin (FLOMAX) 0.4 MG CAPS  capsule, Take 0.4 mg by mouth daily., Disp: , Rfl:    TESTOSTERONE AQUEOUS IM, Inject 400 mg into the muscle See admin instructions. 400 mg once a week every Friday, Disp: , Rfl:   Past Medical History: Past Medical History:  Diagnosis Date   Asthmatic bronchitis    Benign bladder tumor    CAD (coronary artery disease) 2002   BMS x 2 and DES - [place] (multivessel)   Chronic renal failure, stage 3 (moderate), unspecified whether stage 3a or 3b CKD ([place])    Depression    MI (myocardial infarction) ([place]) 2002   Orthostatic hypotension 2002   Prediabetes    Statin intolerance    Crestor - myalgias    Tobacco Use: Social History   Tobacco Use  Smoking Status Former   Types: Cigarettes   Quit date: 2002   Years since quitting: 21.0  Smokeless Tobacco Never    Labs: Recent Review Scientist, physiological     Labs for ITP Cardiac and Pulmonary Rehab Latest Ref Rng & Units 11/23/2020 11/23/2020 11/23/2020 11/23/2020 11/27/2020   Cholestrol 0 - 200 mg/dL - - - - -   LDLCALC 0 - 99 mg/dL - - - - -   HDL >40 mg/dL - - - - -   Trlycerides <150 mg/dL - - - - -   Hemoglobin A1c 4.8 - 5.6 % - - - - -  PHART 7.350 - 7.450 7.334(L) 7.284(L) 7.318(L) 7.314(L) 7.486(H)   PCO2ART 32.0 - 48.0 mmHg 37.5 40.6 41.7 41.7 32.0   HCO3 20.0 - 28.0 mmol/L 20.1 19.4(L) 21.4 21.1 23.9   TCO2 22 - 32 mmol/L 21(L) 21(L) 23 22 -   ACIDBASEDEF 0.0 - 2.0 mmol/L 5.0(H) 7.0(H) 5.0(H) 5.0(H) -   O2SAT % 93.0 97.0 96.0 95.0 99.0       Capillary Blood Glucose: Lab Results  Component Value Date   GLUCAP 221 (H) 11/30/2020   GLUCAP 143 (H) 11/30/2020   GLUCAP 173 (H) 11/29/2020   GLUCAP 173 (H) 11/29/2020   GLUCAP 193 (H) 11/29/2020    POCT Glucose     Row Name 01/25/21 1342 01/30/21 1559           POCT Blood Glucose   Pre-Exercise 98 mg/dL 110 mg/dL               Exercise Target Goals: Exercise Program Goal: Individual exercise prescription set using results from initial 6 min walk test  and THRR while considering  patients activity barriers and safety.   Exercise Prescription Goal: Starting with aerobic activity 30 plus minutes a day, 3 days per week for initial exercise prescription. Provide home exercise prescription and guidelines that participant acknowledges understanding prior to discharge.  Activity Barriers & Risk Stratification:  Activity Barriers & Cardiac Risk Stratification - 01/25/21 1312       Activity Barriers & Cardiac Risk Stratification   Activity Barriers Arthritis;Back Problems;Deconditioning;Balance Concerns    Cardiac Risk Stratification High             6 Minute Walk:  6 Minute Walk     Row Name 01/25/21 1406         6 Minute Walk   Phase Initial     Distance 1200 feet     Walk Time 6 minutes     # of Rest Breaks 0     MPH 2.3     METS 2.27     RPE 12     VO2 Peak 7.96     Symptoms Yes (comment)     Comments slight lower back discomfort     Resting HR 74 bpm     Resting BP 124/64     Resting Oxygen Saturation  97 %     Exercise Oxygen Saturation  during 6 min walk 96 %     Max Ex. HR 97 bpm     Max Ex. BP 140/60     2 Minute Post BP 128/60              Oxygen Initial Assessment:   Oxygen Re-Evaluation:   Oxygen Discharge (Final Oxygen Re-Evaluation):   Initial Exercise Prescription:  Initial Exercise Prescription - 01/25/21 1400       Date of Initial Exercise RX and Referring Provider   Date 01/25/21    Referring Provider [name]    Expected Discharge Date 04/14/21      NuStep   Level 1    SPM 60    Minutes 22      Arm Ergometer   Level 1    RPM 50    Minutes 17      Prescription Details   Frequency (times per week) 3    Duration Progress to 30 minutes of continuous aerobic without signs/symptoms of physical distress      Intensity   THRR 40-80% of Max Heartrate 59-118    Ratings of Perceived  Exertion 11-13    Perceived Dyspnea 0-4      Resistance Training   Training Prescription  Yes    Weight 3    Reps 10-15             Perform Capillary Blood Glucose checks as needed.  Exercise Prescription Changes:   Exercise Prescription Changes     Row Name 01/30/21 1500             Response to Exercise   Blood Pressure (Admit) 124/72       Blood Pressure (Exercise) 140/70       Blood Pressure (Exit) 106/74       Heart Rate (Admit) 81 bpm       Heart Rate (Exercise) 105 bpm       Heart Rate (Exit) 88 bpm       Rating of Perceived Exertion (Exercise) 13       Duration Continue with 30 min of aerobic exercise without signs/symptoms of physical distress.       Intensity THRR unchanged         Progression   Progression Continue to progress workloads to maintain intensity without signs/symptoms of physical distress.         Resistance Training   Training Prescription Yes       Weight 3       Reps 10-15       Time 10 Minutes         NuStep   Level 1       SPM 76       Minutes 17       METs 1.75         Arm Ergometer   Level 1       RPM 28       Minutes 22       METs 1.34                Exercise Comments:   Exercise Goals and Review:   Exercise Goals     Row Name 01/25/21 1410 01/30/21 1601           Exercise Goals   Increase Physical Activity Yes Yes      Intervention Provide advice, education, support and counseling about physical activity/exercise needs.;Develop an individualized exercise prescription for aerobic and resistive training based on initial evaluation findings, risk stratification, comorbidities and participant's personal goals. Provide advice, education, support and counseling about physical activity/exercise needs.;Develop an individualized exercise prescription for aerobic and resistive training based on initial evaluation findings, risk stratification, comorbidities and participant's personal goals.      Expected Outcomes Short Term: Attend rehab on a regular basis to increase amount of physical activity.;Long Term:  Exercising regularly at least 3-5 days a week.;Long Term: Add in home exercise to make exercise part of routine and to increase amount of physical activity. Short Term: Attend rehab on a regular basis to increase amount of physical activity.;Long Term: Exercising regularly at least 3-5 days a week.;Long Term: Add in home exercise to make exercise part of routine and to increase amount of physical activity.      Increase Strength and Stamina Yes Yes      Intervention Provide advice, education, support and counseling about physical activity/exercise needs.;Develop an individualized exercise prescription for aerobic and resistive training based on initial evaluation findings, risk stratification, comorbidities and participant's personal goals. Provide advice, education, support and counseling about physical activity/exercise needs.;Develop an individualized exercise prescription for aerobic and resistive  training based on initial evaluation findings, risk stratification, comorbidities and participant's personal goals.      Expected Outcomes Short Term: Increase workloads from initial exercise prescription for resistance, speed, and METs.;Short Term: Perform resistance training exercises routinely during rehab and add in resistance training at home;Long Term: Improve cardiorespiratory fitness, muscular endurance and strength as measured by increased METs and functional capacity (6MWT) Short Term: Increase workloads from initial exercise prescription for resistance, speed, and METs.;Short Term: Perform resistance training exercises routinely during rehab and add in resistance training at home;Long Term: Improve cardiorespiratory fitness, muscular endurance and strength as measured by increased METs and functional capacity (6MWT)      Able to understand and use rate of perceived exertion (RPE) scale Yes Yes      Intervention Provide education and explanation on how to use RPE scale Provide education and explanation  on how to use RPE scale      Expected Outcomes Long Term:  Able to use RPE to guide intensity level when exercising independently;Short Term: Able to use RPE daily in rehab to express subjective intensity level Long Term:  Able to use RPE to guide intensity level when exercising independently;Short Term: Able to use RPE daily in rehab to express subjective intensity level      Knowledge and understanding of Target Heart Rate Range (THRR) Yes Yes      Intervention Provide education and explanation of THRR including how the numbers were predicted and where they are located for reference Provide education and explanation of THRR including how the numbers were predicted and where they are located for reference      Expected Outcomes Short Term: Able to state/look up THRR;Long Term: Able to use THRR to govern intensity when exercising independently;Short Term: Able to use daily as guideline for intensity in rehab Short Term: Able to state/look up THRR;Long Term: Able to use THRR to govern intensity when exercising independently;Short Term: Able to use daily as guideline for intensity in rehab      Able to check pulse independently Yes Yes      Intervention Provide education and demonstration on how to check pulse in carotid and radial arteries.;Review the importance of being able to check your own pulse for safety during independent exercise Provide education and demonstration on how to check pulse in carotid and radial arteries.;Review the importance of being able to check your own pulse for safety during independent exercise      Expected Outcomes Short Term: Able to explain why pulse checking is important during independent exercise Short Term: Able to explain why pulse checking is important during independent exercise      Understanding of Exercise Prescription Yes Yes      Intervention Provide education, explanation, and written materials on patient's individual exercise prescription Provide education,  explanation, and written materials on patient's individual exercise prescription      Expected Outcomes Short Term: Able to explain program exercise prescription;Long Term: Able to explain home exercise prescription to exercise independently Short Term: Able to explain program exercise prescription;Long Term: Able to explain home exercise prescription to exercise independently               Exercise Goals Re-Evaluation :  Exercise Goals Re-Evaluation     Row Name 01/30/21 1602             Exercise Goal Re-Evaluation   Exercise Goals Review Increase Physical Activity;Increase Strength and Stamina;Able to understand and use rate of perceived exertion (RPE) scale;Knowledge and  understanding of Target Heart Rate Range (THRR);Able to check pulse independently;Understanding of Exercise Prescription       Comments Patient has completed 3 sessions of cardiac rehab. He is deconditioned but he is motivated to progress. He is currently exercising at 1.75 METs. Will continue to monitor and progress as able.       Expected Outcomes Through exercise at rehab and at home, the patient will meet their stated goals.                 Discharge Exercise Prescription (Final Exercise Prescription Changes):  Exercise Prescription Changes - 01/30/21 1500       Response to Exercise   Blood Pressure (Admit) 124/72    Blood Pressure (Exercise) 140/70    Blood Pressure (Exit) 106/74    Heart Rate (Admit) 81 bpm    Heart Rate (Exercise) 105 bpm    Heart Rate (Exit) 88 bpm    Rating of Perceived Exertion (Exercise) 13    Duration Continue with 30 min of aerobic exercise without signs/symptoms of physical distress.    Intensity THRR unchanged      Progression   Progression Continue to progress workloads to maintain intensity without signs/symptoms of physical distress.      Resistance Training   Training Prescription Yes    Weight 3    Reps 10-15    Time 10 Minutes      NuStep   Level 1     SPM 76    Minutes 17    METs 1.75      Arm Ergometer   Level 1    RPM 28    Minutes 22    METs 1.34             Nutrition:  Target Goals: Understanding of nutrition guidelines, daily intake of sodium 1500mg , cholesterol 200mg , calories 30% from fat and 7% or less from saturated fats, daily to have 5 or more servings of fruits and vegetables.  Biometrics:  Pre Biometrics - 01/25/21 1407       Pre Biometrics   Height 5\' 10"  (1.778 m)    Weight 105.6 kg    Waist Circumference 45 inches    Hip Circumference 42 inches    Waist to Hip Ratio 1.07 %    BMI (Calculated) 33.4    Triceps Skinfold 12 mm    % Body Fat 31 %    Grip Strength 37.3 kg    Flexibility 0 in    Single Leg Stand 2.5 seconds              Nutrition Therapy Plan and Nutrition Goals:  Nutrition Therapy & Goals - 01/27/21 1010       Personal Nutrition Goals   Comments Patient scored 20 on his diet assessment. We offer 2 educational sessions on heart healthy nutrition with handouts and assistance with RD referral if patient is interested.      Intervention Plan   Intervention Nutrition handout(s) given to patient.    Expected Outcomes Short Term Goal: Understand basic principles of dietary content, such as calories, fat, sodium, cholesterol and nutrients.             Nutrition Assessments:  Nutrition Assessments - 01/25/21 1316       MEDFICTS Scores   Pre Score 20            MEDIFICTS Score Key: ?70 Need to make dietary changes  40-70 Heart Healthy Diet ? 40 Therapeutic Level Cholesterol Diet  Picture Your Plate Scores: <61 Unhealthy dietary pattern with much room for improvement. 41-50 Dietary pattern unlikely to meet recommendations for good health and room for improvement. 51-60 More healthful dietary pattern, with some room for improvement.  >60 Healthy dietary pattern, although there may be some specific behaviors that could be improved.    Nutrition Goals  Re-Evaluation:   Nutrition Goals Discharge (Final Nutrition Goals Re-Evaluation):   Psychosocial: Target Goals: Acknowledge presence or absence of significant depression and/or stress, maximize coping skills, provide positive support system. Participant is able to verbalize types and ability to use techniques and skills needed for reducing stress and depression.  Initial Review & Psychosocial Screening:  Initial Psych Review & Screening - 01/25/21 1313       Initial Review   Current issues with Current Depression   He is treated with Wellbutrin 300 mg daily and cymbalta 60 mg bid for depression. He reports that this helps tremendously. He formerly took [employer] fast streamer 1.5 mg daily for bi polar disorder but quit due to the cost. He reports no current problems BPD.     Family Dynamics   Good Support System? Yes    Comments His wife is his support system.      Barriers   Psychosocial barriers to participate in program The patient should benefit from training in stress management and relaxation.      Screening Interventions   Interventions Encouraged to exercise;Provide feedback about the scores to participant    Expected Outcomes Long Term goal: The participant improves quality of Life and PHQ9 Scores as seen by post scores and/or verbalization of changes;Short Term goal: Identification and review with participant of any Quality of Life or Depression concerns found by scoring the questionnaire.             Quality of Life Scores:  Quality of Life - 01/25/21 1410       Quality of Life   Select Quality of Life      Quality of Life Scores   Health/Function Pre 16.86 %    Socioeconomic Pre 30 %    Psych/Spiritual Pre 15 %    Family Pre 30 %    GLOBAL Pre 20.21 %            Scores of 19 and below usually indicate a poorer quality of life in these areas.  A difference of  2-3 points is a clinically meaningful difference.  A difference of 2-3 points in the total score of the Quality  of Life Index has been associated with significant improvement in overall quality of life, self-image, physical symptoms, and general health in studies assessing change in quality of life.  PHQ-9: Recent Review Flowsheet Data     Depression screen [facility] 2/9 01/25/2021   Decreased Interest 0   Down, Depressed, Hopeless 0   PHQ - 2 Score 0   Altered sleeping 0   Tired, decreased energy 2   Change in appetite 0   Feeling bad or failure about yourself  0   Trouble concentrating 3   Moving slowly or fidgety/restless 0   Suicidal thoughts 0   PHQ-9 Score 5   Difficult doing work/chores Extremely dIfficult       Interpretation of Total Score  Total Score Depression Severity:  1-4 = Minimal depression, 5-9 = Mild depression, 10-14 = Moderate depression, 15-19 = Moderately severe depression, 20-27 = Severe depression   Psychosocial Evaluation and Intervention:  Psychosocial Evaluation - 01/25/21 1406  Psychosocial Evaluation & Interventions   Interventions Stress management education;Relaxation education;Encouraged to exercise with the program and follow exercise prescription    Comments Pt has no barriers to participating in rehab. He does have depression and is treated with Wellbutrin 300 mg daily and Cymbalta 60 mg bid. He reports that the is helps and he currently is having no issues with his depression. He also has Bi Polar Disorder. He was previously prescribed Braylar 1.5 mg daily, but quit taking it about two months ago due to the cost. He reports no current issues with his Bi Polar Disorder. He has no other identifiable psychosocial issues. His wife had previously asked for a divorce after 40 years, and he was having issues a few months ago, but they have since reconciled. He scored a 5 on his PHQ-9. He relates this to having symptomatic orthostatic hypotension and having problems with his concentration and memory. He attributes this to taking Amiodarone. He quit taking this two  weeks ago, so he is hopeful that these symtoms will go away. He reports that he has a good support system with his wife. His goals for the program are to lose weight and to improve his physical fitness. He has already lost 30 lbs since his CABG, and he would long term like to lose 30 more. He is eager to start the program.    Expected Outcomes Pt's depression will continue to be treated and he will have no other identifiable psychosocial issues.    Continue Psychosocial Services  No Follow up required             Psychosocial Re-Evaluation:  Psychosocial Re-Evaluation     [place] Name 01/27/21 1013             Psychosocial Re-Evaluation   Current issues with Current Depression  Bipolar Disorder       Comments Patient new to the program starting today 1/27. He is currently being treated for depression with Welbutrin 300 mg and Duloxetine 50 mg. He also has bipolar disorder. We will monitor his progress as he goes through the program.       Expected Outcomes Patient will continue to have no psychosocial barriers to participate in the program and his depression and bipolar will continue to be managed.       Interventions Stress management education;Relaxation education;Encouraged to attend Cardiac Rehabilitation for the exercise       Continue Psychosocial Services  No Follow up required                Psychosocial Discharge (Final Psychosocial Re-Evaluation):  Psychosocial Re-Evaluation - 01/27/21 1013       Psychosocial Re-Evaluation   Current issues with Current Depression   Bipolar Disorder   Comments Patient new to the program starting today 1/27. He is currently being treated for depression with Welbutrin 300 mg and Duloxetine 50 mg. He also has bipolar disorder. We will monitor his progress as he goes through the program.    Expected Outcomes Patient will continue to have no psychosocial barriers to participate in the program and his depression and bipolar will continue to be  managed.    Interventions Stress management education;Relaxation education;Encouraged to attend Cardiac Rehabilitation for the exercise    Continue Psychosocial Services  No Follow up required             Vocational Rehabilitation: Provide vocational rehab assistance to qualifying candidates.   Vocational Rehab Evaluation & Intervention:  Vocational Rehab - 01/25/21 1325  Initial Vocational Rehab Evaluation & Intervention   Assessment shows need for Vocational Rehabilitation No             Education: Education Goals: Education classes will be provided on a weekly basis, covering required topics. Participant will state understanding/return demonstration of topics presented.  Learning Barriers/Preferences:  Learning Barriers/Preferences - 01/25/21 1320       Learning Barriers/Preferences   Learning Barriers None    Learning Preferences Individual Instruction;Skilled Demonstration             Education Topics: Hypertension, Hypertension Reduction -Define heart disease and high blood pressure. Discus how high blood pressure affects the body and ways to reduce high blood pressure.   Exercise and Your Heart -Discuss why it is important to exercise, the FITT principles of exercise, normal and abnormal responses to exercise, and how to exercise safely.   Angina -Discuss definition of angina, causes of angina, treatment of angina, and how to decrease risk of having angina.   Cardiac Medications -Review what the following cardiac medications are used for, how they affect the body, and side effects that may occur when taking the medications.  Medications include Aspirin, Beta blockers, calcium channel blockers, ACE Inhibitors, angiotensin receptor blockers, diuretics, digoxin, and antihyperlipidemics.   Congestive Heart Failure -Discuss the definition of CHF, how to live with CHF, the signs and symptoms of CHF, and how keep track of weight and sodium  intake.   Heart Disease and Intimacy -Discus the effect sexual activity has on the heart, how changes occur during intimacy as we age, and safety during sexual activity.   Smoking Cessation / COPD -Discuss different methods to quit smoking, the health benefits of quitting smoking, and the definition of COPD.   Nutrition I: Fats -Discuss the types of cholesterol, what cholesterol does to the heart, and how cholesterol levels can be controlled.   Nutrition II: Labels -Discuss the different components of food labels and how to read food label   Heart Parts/Heart Disease and PAD -Discuss the anatomy of the heart, the pathway of blood circulation through the heart, and these are affected by heart disease.   Stress I: Signs and Symptoms -Discuss the causes of stress, how stress may lead to anxiety and depression, and ways to limit stress.   Stress II: Relaxation -Discuss different types of relaxation techniques to limit stress.   Warning Signs of Stroke / TIA -Discuss definition of a stroke, what the signs and symptoms are of a stroke, and how to identify when someone is having stroke.   Knowledge Questionnaire Score:  Knowledge Questionnaire Score - 01/25/21 1323       Knowledge Questionnaire Score   Pre Score 23/24             Core Components/Risk Factors/Patient Goals at Admission:  Personal Goals and Risk Factors at Admission - 01/25/21 1325       Core Components/Risk Factors/Patient Goals on Admission    Weight Management Yes;Obesity;Weight Loss    Intervention Weight Management: Develop a combined nutrition and exercise program designed to reach desired caloric intake, while maintaining appropriate intake of nutrient and fiber, sodium and fats, and appropriate energy expenditure required for the weight goal.;Weight Management: Provide education and appropriate resources to help participant work on and attain dietary goals.;Weight Management/Obesity: Establish  reasonable short term and long term weight goals.;Obesity: Provide education and appropriate resources to help participant work on and attain dietary goals.    Expected Outcomes Short Term: Continue to assess and  modify interventions until short term weight is achieved;Long Term: Adherence to nutrition and physical activity/exercise program aimed toward attainment of established weight goal;Weight Maintenance: Understanding of the daily nutrition guidelines, which includes 25-35% calories from fat, 7% or less cal from saturated fats, less than 200mg  cholesterol, less than 1.5gm of sodium, & 5 or more servings of fruits and vegetables daily;Weight Loss: Understanding of general recommendations for a balanced deficit meal plan, which promotes 1-2 lb weight loss per week and includes a negative energy balance of [phone] kcal/d;Understanding recommendations for meals to include 15-35% energy as protein, 25-35% energy from fat, 35-60% energy from carbohydrates, less than 200mg  of dietary cholesterol, 20-35 gm of total fiber daily;Understanding of distribution of calorie intake throughout the day with the consumption of 4-5 meals/snacks    Personal Goal Other Yes    Personal Goal Improve physical fitness    Intervention Attend cardiac rehab and begin a home exercise program.    Expected Outcomes Pt will improve his physical fitness.             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 He plans to start the program today 1/27. His personal goals for the program are to improve his physical fitness and lose weight. We will monitor his progress as he works towards meeting these goals.       Expected Outcomes Patient will complete the program meeting both  personal and program goals.                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 He plans to start the program today 1/27. His personal goals for the program are to improve his physical fitness and lose weight. We will monitor his progress as he works towards meeting these goals.    Expected Outcomes Patient will complete the program meeting both personal and program goals.             ITP Comments:   Comments: ITP REVIEW Pt is making expected progress toward Cardiac Rehab goals after completing 3 sessions. Recommend continued exercise, life style modification, education, and increased stamina and strength.

## 2021-02-01 NOTE — Progress Notes (Signed)
Daily Session Note  Patient Details  Name: Corey Woods MRN: [id number] Date of Birth: [birth year] Referring Provider:   Flowsheet Row CARDIAC REHAB PHASE II ORIENTATION from 01/25/2021 in [place]  Referring Provider [name]       Encounter Date: 02/01/2021  Check In:  Session Check In - 02/01/21 1100       Check-In   Supervising physician immediately available to respond to emergencies [facility] MD immediately available    Physician(s) Dr. Harl Bowie    Location [facility]    Staff Present Aundra Dubin, RN, BSN;Heather Otho Ket, BS, Exercise Physiologist;Lanai Conlee Kris Mouton, MS, ACSM-CEP, Exercise Physiologist;Other    Virtual Visit No    Medication changes reported     No    Fall or balance concerns reported    Yes    Comments He has Orthostatic hypotension and gets dizzy frequently. He has not fallen though.    Tobacco Cessation No Change    Warm-up and Cool-down Performed as group-led instruction    Resistance Training Performed Yes    VAD Patient? No    PAD/SET Patient? No      Pain Assessment   Currently in Pain? No/denies    Multiple Pain Sites No             Capillary Blood Glucose: No results found for this or any previous visit (from the past 24 hour(s)).    Social History   Tobacco Use  Smoking Status Former   Types: Cigarettes   Quit date: 2002   Years since quitting: 21.0  Smokeless Tobacco Never    Goals Met:  Independence with exercise equipment Exercise tolerated well No report of concerns or symptoms today Strength training completed today  Goals Unmet:  Not Applicable  Comments: checkout time is 1200   Dr. Kathie Dike is Medical Director for [facility].

## 2021-02-03 ENCOUNTER — Encounter (HOSPITAL_COMMUNITY)
Admission: RE | Admit: 2021-02-03 | Discharge: 2021-02-03 | Disposition: A | Discharge status: Home / Self Care | Payer: Medicare Other | Source: Ambulatory Visit | Attending: Cardiology | Admitting: Cardiology

## 2021-02-03 DIAGNOSIS — I252 Old myocardial infarction: Secondary | ICD-10-CM | POA: Diagnosis not present

## 2021-02-03 DIAGNOSIS — I214 Non-ST elevation (NSTEMI) myocardial infarction: Secondary | ICD-10-CM | POA: Diagnosis not present

## 2021-02-03 DIAGNOSIS — E291 Testicular hypofunction: Secondary | ICD-10-CM | POA: Diagnosis not present

## 2021-02-03 DIAGNOSIS — I2510 Atherosclerotic heart disease of native coronary artery without angina pectoris: Secondary | ICD-10-CM | POA: Diagnosis not present

## 2021-02-03 DIAGNOSIS — E1122 Type 2 diabetes mellitus with diabetic chronic kidney disease: Secondary | ICD-10-CM | POA: Diagnosis not present

## 2021-02-03 DIAGNOSIS — Z951 Presence of aortocoronary bypass graft: Secondary | ICD-10-CM | POA: Diagnosis not present

## 2021-02-03 DIAGNOSIS — E039 Hypothyroidism, unspecified: Secondary | ICD-10-CM | POA: Diagnosis not present

## 2021-02-03 DIAGNOSIS — J95821 Acute postprocedural respiratory failure: Secondary | ICD-10-CM | POA: Diagnosis not present

## 2021-02-03 DIAGNOSIS — E782 Mixed hyperlipidemia: Secondary | ICD-10-CM | POA: Diagnosis not present

## 2021-02-03 DIAGNOSIS — R7303 Prediabetes: Secondary | ICD-10-CM | POA: Diagnosis not present

## 2021-02-03 NOTE — Progress Notes (Signed)
Daily Session Note  Patient Details  Name: Corey Woods MRN: [id number] Date of Birth: 1945/09/18 Referring Provider:   Flowsheet Row CARDIAC REHAB PHASE II ORIENTATION from 01/25/2021 in [place]  Referring Provider [name]       Encounter Date: 02/03/2021  Check In:  Session Check In - 02/03/21 1100       Check-In   Supervising physician immediately available to respond to emergencies [facility] MD immediately available    Physician(s) Dr. Harrington Challenger    Location [facility]    Staff Present Redge Gainer, BS, Exercise Physiologist;Kendall Justo Kris Mouton, MS, ACSM-CEP, Exercise Physiologist;Debra Wynetta Emery, RN, BSN    Virtual Visit No    Medication changes reported     No    Fall or balance concerns reported    Yes    Comments He has Orthostatic hypotension and gets dizzy frequently. He has not fallen though.    Tobacco Cessation No Change    Warm-up and Cool-down Performed as group-led instruction    Resistance Training Performed Yes    VAD Patient? No    PAD/SET Patient? No      Pain Assessment   Currently in Pain? No/denies    Multiple Pain Sites No             Capillary Blood Glucose: No results found for this or any previous visit (from the past 24 hour(s)).    Social History   Tobacco Use  Smoking Status Former   Types: Cigarettes   Quit date: 2002   Years since quitting: 21.1  Smokeless Tobacco Never    Goals Met:  Independence with exercise equipment Exercise tolerated well No report of concerns or symptoms today Strength training completed today  Goals Unmet:  Not Applicable  Comments: checkout time is 1200   Dr. Carlyle Dolly is Medical Director for [place]

## 2021-02-06 ENCOUNTER — Encounter (HOSPITAL_COMMUNITY)
Admission: RE | Admit: 2021-02-06 | Discharge: 2021-02-06 | Disposition: A | Discharge status: Home / Self Care | Payer: Medicare Other | Source: Ambulatory Visit | Attending: Cardiology | Admitting: Cardiology

## 2021-02-06 ENCOUNTER — Ambulatory Visit: Payer: Medicare Other | Admitting: Physician Assistant

## 2021-02-06 DIAGNOSIS — N183 Chronic kidney disease, stage 3 unspecified: Secondary | ICD-10-CM

## 2021-02-06 DIAGNOSIS — I214 Non-ST elevation (NSTEMI) myocardial infarction: Secondary | ICD-10-CM | POA: Diagnosis not present

## 2021-02-06 DIAGNOSIS — I252 Old myocardial infarction: Secondary | ICD-10-CM | POA: Diagnosis not present

## 2021-02-06 DIAGNOSIS — I480 Paroxysmal atrial fibrillation: Secondary | ICD-10-CM

## 2021-02-06 DIAGNOSIS — Z951 Presence of aortocoronary bypass graft: Secondary | ICD-10-CM

## 2021-02-06 DIAGNOSIS — I255 Ischemic cardiomyopathy: Secondary | ICD-10-CM

## 2021-02-06 DIAGNOSIS — I2510 Atherosclerotic heart disease of native coronary artery without angina pectoris: Secondary | ICD-10-CM | POA: Diagnosis not present

## 2021-02-06 DIAGNOSIS — I25810 Atherosclerosis of coronary artery bypass graft(s) without angina pectoris: Secondary | ICD-10-CM

## 2021-02-06 DIAGNOSIS — J95821 Acute postprocedural respiratory failure: Secondary | ICD-10-CM | POA: Diagnosis not present

## 2021-02-06 DIAGNOSIS — R7303 Prediabetes: Secondary | ICD-10-CM | POA: Diagnosis not present

## 2021-02-06 NOTE — Progress Notes (Signed)
Daily Session Note  Patient Details  Name: Corey Woods MRN: [id number] Date of Birth: 1945-05-02 Referring Provider:   Flowsheet Row CARDIAC REHAB PHASE II ORIENTATION from 01/25/2021 in [place]  Referring Provider [name]       Encounter Date: 02/06/2021  Check In:  Session Check In - 02/06/21 1100       Check-In   Supervising physician immediately available to respond to emergencies [facility] immediately available    Physician(s) Dr. Audie Box    Location [facility]    Staff Present Redge Gainer, BS, Exercise Physiologist;Rosselyn Martha Kris Mouton, MS, ACSM-CEP, Exercise Physiologist;Debra Wynetta Emery, RN, BSN    Virtual Visit No    Medication changes reported     No    Fall or balance concerns reported    Yes    Comments He has Orthostatic hypotension and gets dizzy frequently. He has not fallen though.    Tobacco Cessation No Change    Warm-up and Cool-down Performed as group-led instruction    Resistance Training Performed Yes    VAD Patient? No    PAD/SET Patient? No      Pain Assessment   Currently in Pain? No/denies    Multiple Pain Sites No             Capillary Blood Glucose: No results found for this or any previous visit (from the past 24 hour(s)).    Social History   Tobacco Use  Smoking Status Former   Types: Cigarettes   Quit date: 2002   Years since quitting: 21.1  Smokeless Tobacco Never    Goals Met:  Independence with exercise equipment Exercise tolerated well No report of concerns or symptoms today Strength training completed today  Goals Unmet:  Not Applicable  Comments: checkout time is 1200   Dr. Carlyle Dolly is Medical Director for [place]

## 2021-02-06 NOTE — Progress Notes (Signed)
I have reviewed a Home Exercise Prescription with Corey Woods . Corey Woods is not currently exercising at home.  The patient was advised to walk 2 days a week for 30-45 minutes.  Corey Woods and I discussed how to progress their exercise prescription.  The patient stated that their goals were build back his endurance and lose weight.  The patient stated that they understand the exercise prescription.  We reviewed exercise guidelines, target heart rate during exercise, RPE Scale, weather conditions, NTG use, endpoints for exercise, warmup and cool down.  Patient is encouraged to come to me with any questions. I will continue to follow up with the patient to assist them with progression and safety.

## 2021-02-08 ENCOUNTER — Encounter (HOSPITAL_COMMUNITY)
Admission: RE | Admit: 2021-02-08 | Discharge: 2021-02-08 | Disposition: A | Discharge status: Home / Self Care | Payer: Medicare Other | Source: Ambulatory Visit | Attending: Cardiology | Admitting: Cardiology

## 2021-02-08 DIAGNOSIS — I252 Old myocardial infarction: Secondary | ICD-10-CM | POA: Diagnosis not present

## 2021-02-08 DIAGNOSIS — Z951 Presence of aortocoronary bypass graft: Secondary | ICD-10-CM

## 2021-02-08 DIAGNOSIS — I2510 Atherosclerotic heart disease of native coronary artery without angina pectoris: Secondary | ICD-10-CM | POA: Diagnosis not present

## 2021-02-08 DIAGNOSIS — I214 Non-ST elevation (NSTEMI) myocardial infarction: Secondary | ICD-10-CM | POA: Diagnosis not present

## 2021-02-08 DIAGNOSIS — R7303 Prediabetes: Secondary | ICD-10-CM | POA: Diagnosis not present

## 2021-02-08 DIAGNOSIS — J95821 Acute postprocedural respiratory failure: Secondary | ICD-10-CM | POA: Diagnosis not present

## 2021-02-08 NOTE — Progress Notes (Signed)
Daily Session Note  Patient Details  Name: Corey Woods MRN: [id number] Date of Birth: Dec 13, 1945 Referring Provider:   Flowsheet Row CARDIAC REHAB PHASE II ORIENTATION from 01/25/2021 in [place]  Referring Provider [name]       Encounter Date: 02/08/2021  Check In:  Session Check In - 02/08/21 1100       Check-In   Supervising physician immediately available to respond to emergencies [facility] MD immediately available    Physician(s) Dr. Johnsie Cancel    Location [facility]    Staff Present Redge Gainer, BS, Exercise Physiologist;Azalee Weimer Kris Mouton, MS, ACSM-CEP, Exercise Physiologist;Debra Wynetta Emery, RN, BSN    Virtual Visit No    Medication changes reported     No    Fall or balance concerns reported    Yes    Comments He has Orthostatic hypotension and gets dizzy frequently. He has not fallen though.    Tobacco Cessation No Change    Warm-up and Cool-down Performed as group-led instruction    Resistance Training Performed Yes    VAD Patient? No    PAD/SET Patient? No      Pain Assessment   Currently in Pain? No/denies    Multiple Pain Sites No             Capillary Blood Glucose: No results found for this or any previous visit (from the past 24 hour(s)).    Social History   Tobacco Use  Smoking Status Former   Types: Cigarettes   Quit date: 2002   Years since quitting: 21.1  Smokeless Tobacco Never    Goals Met:  Independence with exercise equipment Exercise tolerated well No report of concerns or symptoms today Strength training completed today  Goals Unmet:  Not Applicable  Comments: checkout time is 1200   Dr. Carlyle Dolly is Medical Director for [place]

## 2021-02-10 ENCOUNTER — Encounter (HOSPITAL_COMMUNITY)
Admission: RE | Admit: 2021-02-10 | Discharge: 2021-02-10 | Disposition: A | Discharge status: Home / Self Care | Payer: Medicare Other | Source: Ambulatory Visit | Attending: Cardiology | Admitting: Cardiology

## 2021-02-10 DIAGNOSIS — Z951 Presence of aortocoronary bypass graft: Secondary | ICD-10-CM | POA: Diagnosis not present

## 2021-02-10 DIAGNOSIS — R7303 Prediabetes: Secondary | ICD-10-CM

## 2021-02-10 DIAGNOSIS — I252 Old myocardial infarction: Secondary | ICD-10-CM | POA: Diagnosis not present

## 2021-02-10 DIAGNOSIS — I214 Non-ST elevation (NSTEMI) myocardial infarction: Secondary | ICD-10-CM | POA: Diagnosis not present

## 2021-02-10 DIAGNOSIS — J95821 Acute postprocedural respiratory failure: Secondary | ICD-10-CM

## 2021-02-10 DIAGNOSIS — I2510 Atherosclerotic heart disease of native coronary artery without angina pectoris: Secondary | ICD-10-CM | POA: Diagnosis not present

## 2021-02-10 NOTE — Progress Notes (Signed)
Daily Session Note  Patient Details  Name: Corey Woods MRN: [id number] Date of Birth: 1945/10/24 Referring Provider:   Flowsheet Row CARDIAC REHAB PHASE II ORIENTATION from 01/25/2021 in [place]  Referring Provider [name]       Encounter Date: 02/10/2021  Check In:  Session Check In - 02/10/21 1100       Check-In   Supervising physician immediately available to respond to emergencies [facility] immediately available    Physician(s) Dr Marlou Porch    Location [facility]    Staff Present Aundra Dubin, RN, Madlyn Frankel, RN, Bjorn Loser, MS, ACSM-CEP, Exercise Physiologist;Heather Zigmund Bintou Lafata, Exercise Physiologist    Virtual Visit No    Medication changes reported     No    Fall or balance concerns reported    Yes    Comments He has Orthostatic hypotension and gets dizzy frequently. He has not fallen though.    Tobacco Cessation No Change    Warm-up and Cool-down Performed as group-led instruction    Resistance Training Performed Yes    VAD Patient? No    PAD/SET Patient? No      Pain Assessment   Currently in Pain? No/denies    Multiple Pain Sites No             Capillary Blood Glucose: No results found for this or any previous visit (from the past 24 hour(s)).    Social History   Tobacco Use  Smoking Status Former   Types: Cigarettes   Quit date: 2002   Years since quitting: 21.1  Smokeless Tobacco Never    Goals Met:  Independence with exercise equipment Exercise tolerated well No report of concerns or symptoms today  Goals Unmet:  Not Applicable  Comments: checkout 1200   Dr. Carlyle Dolly is Medical Director for [place]

## 2021-02-13 ENCOUNTER — Encounter (HOSPITAL_COMMUNITY): Payer: Medicare Other

## 2021-02-13 ENCOUNTER — Encounter (HOSPITAL_COMMUNITY)
Admission: RE | Admit: 2021-02-13 | Discharge: 2021-02-13 | Disposition: A | Discharge status: Home / Self Care | Payer: Medicare Other | Source: Ambulatory Visit | Attending: Cardiology | Admitting: Cardiology

## 2021-02-13 VITALS — Wt 232.4 lb

## 2021-02-13 DIAGNOSIS — I2510 Atherosclerotic heart disease of native coronary artery without angina pectoris: Secondary | ICD-10-CM | POA: Diagnosis not present

## 2021-02-13 DIAGNOSIS — Z951 Presence of aortocoronary bypass graft: Secondary | ICD-10-CM | POA: Diagnosis not present

## 2021-02-13 DIAGNOSIS — I214 Non-ST elevation (NSTEMI) myocardial infarction: Secondary | ICD-10-CM | POA: Diagnosis not present

## 2021-02-13 DIAGNOSIS — R7303 Prediabetes: Secondary | ICD-10-CM | POA: Diagnosis not present

## 2021-02-13 DIAGNOSIS — J95821 Acute postprocedural respiratory failure: Secondary | ICD-10-CM | POA: Diagnosis not present

## 2021-02-13 DIAGNOSIS — I252 Old myocardial infarction: Secondary | ICD-10-CM | POA: Diagnosis not present

## 2021-02-13 NOTE — Progress Notes (Signed)
Daily Session Note  Patient Details  Name: Corey Woods MRN: [id number] Date of Birth: [birth year] Referring Provider:   Flowsheet Row CARDIAC REHAB PHASE II ORIENTATION from 01/25/2021 in [place]  Referring Provider Dr. Kipp Woods       Encounter Date: 02/13/2021  Check In:  Session Check In - 02/13/21 1100       Check-In   Supervising physician immediately available to respond to emergencies [facility] immediately available    Physician(s) Dr. Harl Bowie    [facility]    Staff Present Aundra Dubin, RN, Madlyn Frankel, RN, Bjorn Loser, MS, ACSM-CEP, Exercise Physiologist;Lexani Corona Zigmund Daniel, Exercise Physiologist    Virtual Visit No    Medication changes reported     No    Fall or balance concerns reported    Yes    Comments He has Orthostatic hypotension and gets dizzy frequently. He has not fallen though.    Tobacco Cessation No Change    Warm-up and Cool-down Performed as group-led instruction    Resistance Training Performed Yes    VAD Patient? No    PAD/SET Patient? No      Pain Assessment   Currently in Pain? No/denies    Multiple Pain Sites No             Capillary Blood Glucose: No results found for this or any previous visit (from the past 24 hour(s)).    Social History   Tobacco Use  Smoking Status Former   Types: Cigarettes   Quit date: 2002   Years since quitting: 21.1  Smokeless Tobacco Never    Goals Met:  Independence with exercise equipment Exercise tolerated well No report of concerns or symptoms today Strength training completed today  Goals Unmet:  Not Applicable  Comments: check out 1200   Dr. Carlyle Dolly is Medical Director for [place]

## 2021-02-14 DIAGNOSIS — I10 Essential (primary) hypertension: Secondary | ICD-10-CM | POA: Diagnosis not present

## 2021-02-14 DIAGNOSIS — E663 Overweight: Secondary | ICD-10-CM | POA: Diagnosis not present

## 2021-02-14 DIAGNOSIS — N1831 Chronic kidney disease, stage 3a: Secondary | ICD-10-CM | POA: Diagnosis not present

## 2021-02-14 DIAGNOSIS — F319 Bipolar disorder, unspecified: Secondary | ICD-10-CM | POA: Diagnosis not present

## 2021-02-14 DIAGNOSIS — F331 Major depressive disorder, recurrent, moderate: Secondary | ICD-10-CM | POA: Diagnosis not present

## 2021-02-14 DIAGNOSIS — F5221 Male erectile disorder: Secondary | ICD-10-CM | POA: Diagnosis not present

## 2021-02-14 DIAGNOSIS — D509 Iron deficiency anemia, unspecified: Secondary | ICD-10-CM | POA: Diagnosis not present

## 2021-02-14 DIAGNOSIS — E291 Testicular hypofunction: Secondary | ICD-10-CM | POA: Diagnosis not present

## 2021-02-14 DIAGNOSIS — R0600 Dyspnea, unspecified: Secondary | ICD-10-CM | POA: Diagnosis not present

## 2021-02-14 DIAGNOSIS — E782 Mixed hyperlipidemia: Secondary | ICD-10-CM | POA: Diagnosis not present

## 2021-02-14 DIAGNOSIS — R413 Other amnesia: Secondary | ICD-10-CM | POA: Insufficient documentation

## 2021-02-14 DIAGNOSIS — J4520 Mild intermittent asthma, uncomplicated: Secondary | ICD-10-CM | POA: Diagnosis not present

## 2021-02-14 DIAGNOSIS — I2510 Atherosclerotic heart disease of native coronary artery without angina pectoris: Secondary | ICD-10-CM | POA: Diagnosis not present

## 2021-02-15 ENCOUNTER — Encounter (HOSPITAL_COMMUNITY)
Admission: RE | Admit: 2021-02-15 | Discharge: 2021-02-15 | Disposition: A | Discharge status: Home / Self Care | Payer: Medicare Other | Source: Ambulatory Visit | Attending: Cardiology | Admitting: Cardiology

## 2021-02-15 DIAGNOSIS — Z951 Presence of aortocoronary bypass graft: Secondary | ICD-10-CM

## 2021-02-15 DIAGNOSIS — R7303 Prediabetes: Secondary | ICD-10-CM | POA: Diagnosis not present

## 2021-02-15 DIAGNOSIS — I252 Old myocardial infarction: Secondary | ICD-10-CM | POA: Diagnosis not present

## 2021-02-15 DIAGNOSIS — J95821 Acute postprocedural respiratory failure: Secondary | ICD-10-CM | POA: Diagnosis not present

## 2021-02-15 DIAGNOSIS — I2510 Atherosclerotic heart disease of native coronary artery without angina pectoris: Secondary | ICD-10-CM | POA: Diagnosis not present

## 2021-02-15 DIAGNOSIS — I214 Non-ST elevation (NSTEMI) myocardial infarction: Secondary | ICD-10-CM | POA: Diagnosis not present

## 2021-02-15 NOTE — Progress Notes (Signed)
Daily Session Note  Patient Details  Name: Corey Woods MRN: [id number] Date of Birth: February 08, 1945 Referring Provider:   Flowsheet Row CARDIAC REHAB PHASE II ORIENTATION from 01/25/2021 in [place]  Referring Provider [name]       Encounter Date: 02/15/2021  Check In:  Session Check In - 02/15/21 1128       Check-In   Supervising physician immediately available to respond to emergencies [facility] immediately available    Physician(s) Dr. Harl Bowie    Location [facility]    Staff Present Hoy Register, MS, ACSM-CEP, Exercise Physiologist;Heather Otho Ket, BS, Exercise Physiologist;Manvir Prabhu Wynetta Emery, RN, BSN    Virtual Visit No    Medication changes reported     No    Fall or balance concerns reported    Yes    Comments He has Orthostatic hypotension and gets dizzy frequently. He has not fallen though.    Tobacco Cessation No Change    Warm-up and Cool-down Performed as group-led instruction    Resistance Training Performed Yes    VAD Patient? No    PAD/SET Patient? No      Pain Assessment   Currently in Pain? No/denies    Multiple Pain Sites No             Capillary Blood Glucose: No results found for this or any previous visit (from the past 24 hour(s)).    Social History   Tobacco Use  Smoking Status Former   Types: Cigarettes   Quit date: 2002   Years since quitting: 21.1  Smokeless Tobacco Never    Goals Met:  Independence with exercise equipment Exercise tolerated well No report of concerns or symptoms today Strength training completed today  Goals Unmet:  Not Applicable  Comments: Check out 1200.   Dr. Carlyle Dolly is Medical Director for [facility]

## 2021-02-17 ENCOUNTER — Encounter (HOSPITAL_COMMUNITY): Payer: Medicare Other

## 2021-02-17 ENCOUNTER — Encounter (HOSPITAL_COMMUNITY)
Admission: RE | Admit: 2021-02-17 | Discharge: 2021-02-17 | Disposition: A | Discharge status: Home / Self Care | Payer: Medicare Other | Source: Ambulatory Visit | Attending: Thoracic Surgery (Cardiothoracic Vascular Surgery) | Admitting: Thoracic Surgery (Cardiothoracic Vascular Surgery)

## 2021-02-17 DIAGNOSIS — I252 Old myocardial infarction: Secondary | ICD-10-CM | POA: Diagnosis not present

## 2021-02-17 DIAGNOSIS — Z951 Presence of aortocoronary bypass graft: Secondary | ICD-10-CM

## 2021-02-17 DIAGNOSIS — I214 Non-ST elevation (NSTEMI) myocardial infarction: Secondary | ICD-10-CM

## 2021-02-17 DIAGNOSIS — I2510 Atherosclerotic heart disease of native coronary artery without angina pectoris: Secondary | ICD-10-CM | POA: Diagnosis not present

## 2021-02-17 DIAGNOSIS — R7303 Prediabetes: Secondary | ICD-10-CM | POA: Diagnosis not present

## 2021-02-17 DIAGNOSIS — J95821 Acute postprocedural respiratory failure: Secondary | ICD-10-CM | POA: Diagnosis not present

## 2021-02-17 NOTE — Progress Notes (Signed)
Daily Session Note  Patient Details  Name: Elick Aguilera MRN: [id number] Date of Birth: 14-Jan-1945 Referring Provider:   Flowsheet Row CARDIAC REHAB PHASE II ORIENTATION from 01/25/2021 in [place]  Referring Provider [name]       Encounter Date: 02/17/2021  Check In:  Session Check In - 02/17/21 1100       Check-In   Supervising physician immediately available to respond to emergencies [facility] MD immediately available    Physician(s) Dr. Harrington Challenger    Location [facility]    Staff Present Geanie Cooley, RN;[place] Kris Mouton, MS, ACSM-CEP, Exercise Physiologist;Heather Zigmund Daniel, Exercise Physiologist    Virtual Visit No    Medication changes reported     No    Fall or balance concerns reported    Yes    Comments He has Orthostatic hypotension and gets dizzy frequently. He has not fallen though.    Tobacco Cessation No Change    Warm-up and Cool-down Performed as group-led instruction    Resistance Training Performed Yes    VAD Patient? No    PAD/SET Patient? No      Pain Assessment   Currently in Pain? No/denies    Multiple Pain Sites No             Capillary Blood Glucose: No results found for this or any previous visit (from the past 24 hour(s)).    Social History   Tobacco Use  Smoking Status Former   Types: Cigarettes   Quit date: 2002   Years since quitting: 21.1  Smokeless Tobacco Never    Goals Met:  Independence with exercise equipment Exercise tolerated well No report of concerns or symptoms today Strength training completed today  Goals Unmet:  Not Applicable  Comments: check out @ 12:00pm   Dr. Carlyle Dolly is Medical Director for [place]

## 2021-02-20 ENCOUNTER — Encounter (HOSPITAL_COMMUNITY)
Admission: RE | Admit: 2021-02-20 | Discharge: 2021-02-20 | Disposition: A | Discharge status: Home / Self Care | Payer: Medicare Other | Source: Ambulatory Visit | Attending: Cardiology | Admitting: Cardiology

## 2021-02-20 DIAGNOSIS — I2510 Atherosclerotic heart disease of native coronary artery without angina pectoris: Secondary | ICD-10-CM | POA: Diagnosis not present

## 2021-02-20 DIAGNOSIS — J95821 Acute postprocedural respiratory failure: Secondary | ICD-10-CM | POA: Diagnosis not present

## 2021-02-20 DIAGNOSIS — I214 Non-ST elevation (NSTEMI) myocardial infarction: Secondary | ICD-10-CM | POA: Diagnosis not present

## 2021-02-20 DIAGNOSIS — Z951 Presence of aortocoronary bypass graft: Secondary | ICD-10-CM

## 2021-02-20 DIAGNOSIS — I252 Old myocardial infarction: Secondary | ICD-10-CM | POA: Diagnosis not present

## 2021-02-20 DIAGNOSIS — R7303 Prediabetes: Secondary | ICD-10-CM | POA: Diagnosis not present

## 2021-02-20 NOTE — Progress Notes (Signed)
Daily Session Note  Patient Details  Name: Corey Woods MRN: [id number] Date of Birth: Apr 07, 1945 Referring Provider:   Flowsheet Row CARDIAC REHAB PHASE II ORIENTATION from 01/25/2021 in [place]  Referring Provider [name]       Encounter Date: 02/20/2021  Check In:  Session Check In - 02/20/21 1445       Check-In   Supervising physician immediately available to respond to emergencies [facility] MD immediately available    Physician(s) Dr. Harl Bowie    Location [facility]    Staff Present Geanie Cooley, RN;D[place]ton Kris Mouton, MS, ACSM-CEP, Exercise Physiologist;Heather Zigmund Daniel, Exercise Physiologist    Virtual Visit No    Fall or balance concerns reported    Yes    Comments He has Orthostatic hypotension and gets dizzy frequently. He has not fallen though.    Tobacco Cessation No Change    Warm-up and Cool-down Performed as group-led instruction    Resistance Training Performed Yes    VAD Patient? No    PAD/SET Patient? No      Pain Assessment   Currently in Pain? No/denies    Multiple Pain Sites No             Capillary Blood Glucose: No results found for this or any previous visit (from the past 24 hour(s)).    Social History   Tobacco Use  Smoking Status Former   Types: Cigarettes   Quit date: 2002   Years since quitting: 21.1  Smokeless Tobacco Never    Goals Met:  Independence with exercise equipment Exercise tolerated well No report of concerns or symptoms today Strength training completed today  Goals Unmet:  Not Applicable  Comments: checkout @ 3:45pm   Dr. Carlyle Dolly is Medical Director for [place]

## 2021-02-22 ENCOUNTER — Encounter (HOSPITAL_COMMUNITY)
Admission: RE | Admit: 2021-02-22 | Discharge: 2021-02-22 | Disposition: A | Discharge status: Home / Self Care | Payer: Medicare Other | Source: Ambulatory Visit | Attending: Cardiology | Admitting: Cardiology

## 2021-02-22 DIAGNOSIS — Z951 Presence of aortocoronary bypass graft: Secondary | ICD-10-CM

## 2021-02-22 DIAGNOSIS — R7303 Prediabetes: Secondary | ICD-10-CM | POA: Diagnosis not present

## 2021-02-22 DIAGNOSIS — I252 Old myocardial infarction: Secondary | ICD-10-CM | POA: Diagnosis not present

## 2021-02-22 DIAGNOSIS — J95821 Acute postprocedural respiratory failure: Secondary | ICD-10-CM | POA: Diagnosis not present

## 2021-02-22 DIAGNOSIS — I214 Non-ST elevation (NSTEMI) myocardial infarction: Secondary | ICD-10-CM

## 2021-02-22 DIAGNOSIS — I2510 Atherosclerotic heart disease of native coronary artery without angina pectoris: Secondary | ICD-10-CM | POA: Diagnosis not present

## 2021-02-22 NOTE — Progress Notes (Signed)
Daily Session Note  Patient Details  Name: Corey Woods MRN: [id number] Date of Birth: 03-28-1945 Referring Provider:   Flowsheet Row CARDIAC REHAB PHASE II ORIENTATION from 01/25/2021 in [place]  Referring Provider [name]       Encounter Date: 02/22/2021  Check In:  Session Check In - 02/22/21 1100       Check-In   Supervising physician immediately available to respond to emergencies [facility] MD immediately available    Physician(s) [name]    Location [facility]    Staff Present Redge Gainer, BS, Exercise Physiologist;Mance Vallejo Kris Mouton, MS, ACSM-CEP, Exercise Physiologist;Debra Wynetta Emery, RN, BSN    Virtual Visit No    Medication changes reported     No    Fall or balance concerns reported    Yes    Comments He has Orthostatic hypotension and gets dizzy frequently. He has not fallen though.    Tobacco Cessation No Change    Warm-up and Cool-down Performed as group-led instruction    Resistance Training Performed Yes    VAD Patient? No    PAD/SET Patient? No      Pain Assessment   Currently in Pain? No/denies    Multiple Pain Sites No             Capillary Blood Glucose: No results found for this or any previous visit (from the past 24 hour(s)).    Social History   Tobacco Use  Smoking Status Former   Types: Cigarettes   Quit date: 2002   Years since quitting: 21.1  Smokeless Tobacco Never    Goals Met:  Independence with exercise equipment Exercise tolerated well No report of concerns or symptoms today Strength training completed today  Goals Unmet:  Not Applicable  Comments: checkout time is 1200   Dr. Carlyle Dolly is Medical Director for [place]

## 2021-02-24 ENCOUNTER — Encounter (HOSPITAL_COMMUNITY)
Admission: RE | Admit: 2021-02-24 | Discharge: 2021-02-24 | Disposition: A | Discharge status: Home / Self Care | Payer: Medicare Other | Source: Ambulatory Visit | Attending: Cardiology | Admitting: Cardiology

## 2021-02-24 DIAGNOSIS — J95821 Acute postprocedural respiratory failure: Secondary | ICD-10-CM | POA: Diagnosis not present

## 2021-02-24 DIAGNOSIS — I252 Old myocardial infarction: Secondary | ICD-10-CM | POA: Diagnosis not present

## 2021-02-24 DIAGNOSIS — I2510 Atherosclerotic heart disease of native coronary artery without angina pectoris: Secondary | ICD-10-CM | POA: Diagnosis not present

## 2021-02-24 DIAGNOSIS — I214 Non-ST elevation (NSTEMI) myocardial infarction: Secondary | ICD-10-CM

## 2021-02-24 DIAGNOSIS — Z951 Presence of aortocoronary bypass graft: Secondary | ICD-10-CM

## 2021-02-24 DIAGNOSIS — R7303 Prediabetes: Secondary | ICD-10-CM | POA: Diagnosis not present

## 2021-02-24 NOTE — Progress Notes (Signed)
Daily Session Note  Patient Details  Name: Corey Woods MRN: [id number] Date of Birth: 08/17/1945 Referring Provider:   Flowsheet Row CARDIAC REHAB PHASE II ORIENTATION from 01/25/2021 in [place]  Referring Provider [name]       Encounter Date: 02/24/2021  Check In:  Session Check In - 02/24/21 1100       Check-In   Supervising physician immediately available to respond to emergencies [facility] MD immediately available    Physician(s) [name]    Location [facility]    Staff Present Aundra Dubin, RN, BSN;Heather Otho Ket, BS, Exercise Physiologist;Other   Benay Pillow   Virtual Visit No    Medication changes reported     No    Fall or balance concerns reported    Yes    Comments He has Orthostatic hypotension and gets dizzy frequently. He has not fallen though.    Tobacco Cessation No Change    Warm-up and Cool-down Performed as group-led instruction    Resistance Training Performed Yes    VAD Patient? No    PAD/SET Patient? No      Pain Assessment   Currently in Pain? No/denies    Multiple Pain Sites No             Capillary Blood Glucose: No results found for this or any previous visit (from the past 24 hour(s)).    Social History   Tobacco Use  Smoking Status Former   Types: Cigarettes   Quit date: 2002   Years since quitting: 21.1  Smokeless Tobacco Never    Goals Met:  Independence with exercise equipment Exercise tolerated well No report of concerns or symptoms today Strength training completed today  Goals Unmet:  Not Applicable  Comments: Check out 1200.   Dr. Carlyle Dolly is Medical Director for [facility]

## 2021-02-27 ENCOUNTER — Encounter (HOSPITAL_COMMUNITY)
Admission: RE | Admit: 2021-02-27 | Discharge: 2021-02-27 | Disposition: A | Discharge status: Home / Self Care | Payer: Medicare Other | Source: Ambulatory Visit | Attending: Cardiology | Admitting: Cardiology

## 2021-02-27 VITALS — Wt 232.1 lb

## 2021-02-27 DIAGNOSIS — J95821 Acute postprocedural respiratory failure: Secondary | ICD-10-CM | POA: Diagnosis not present

## 2021-02-27 DIAGNOSIS — I2510 Atherosclerotic heart disease of native coronary artery without angina pectoris: Secondary | ICD-10-CM | POA: Diagnosis not present

## 2021-02-27 DIAGNOSIS — Z951 Presence of aortocoronary bypass graft: Secondary | ICD-10-CM | POA: Diagnosis not present

## 2021-02-27 DIAGNOSIS — R7303 Prediabetes: Secondary | ICD-10-CM | POA: Diagnosis not present

## 2021-02-27 DIAGNOSIS — I252 Old myocardial infarction: Secondary | ICD-10-CM | POA: Diagnosis not present

## 2021-02-27 DIAGNOSIS — I214 Non-ST elevation (NSTEMI) myocardial infarction: Secondary | ICD-10-CM | POA: Diagnosis not present

## 2021-02-27 NOTE — Progress Notes (Signed)
Daily Session Note  Patient Details  Name: Corey Woods MRN: [id number] Date of Birth: 1945/01/26 Referring Provider:   Flowsheet Row CARDIAC REHAB PHASE II ORIENTATION from 01/25/2021 in [place]  Referring Provider [name]       Encounter Date: 02/27/2021  Check In:  Session Check In - 02/27/21 1110       Check-In   Supervising physician immediately available to respond to emergencies [facility] MD immediately available    Physician(s) Dr Harl Bowie    Location [facility]    Staff Present Jilda Roche, RN, Bjorn Loser, MS, ACSM-CEP, Exercise Physiologist;Heather Zigmund Hydie Langan, Exercise Physiologist    Virtual Visit No    Medication changes reported     No    Fall or balance concerns reported    Yes    Comments He has Orthostatic hypotension and gets dizzy frequently. He has not fallen though.    Tobacco Cessation No Change    Warm-up and Cool-down Performed as group-led instruction    Resistance Training Performed Yes    VAD Patient? No    PAD/SET Patient? No      Pain Assessment   Currently in Pain? No/denies             Capillary Blood Glucose: No results found for this or any previous visit (from the past 24 hour(s)).    Social History   Tobacco Use  Smoking Status Former   Types: Cigarettes   Quit date: 2002   Years since quitting: 21.1  Smokeless Tobacco Never    Goals Met:  Independence with exercise equipment Exercise tolerated well No report of concerns or symptoms today Strength training completed today  Goals Unmet:  Not Applicable  Comments: checkout 1200    Dr. Carlyle Dolly is Medical Director for [facility]

## 2021-03-01 ENCOUNTER — Encounter (HOSPITAL_COMMUNITY)
Admission: RE | Admit: 2021-03-01 | Discharge: 2021-03-01 | Disposition: A | Discharge status: Home / Self Care | Payer: Medicare Other | Source: Ambulatory Visit | Attending: Cardiology | Admitting: Cardiology

## 2021-03-01 DIAGNOSIS — Z951 Presence of aortocoronary bypass graft: Secondary | ICD-10-CM

## 2021-03-01 DIAGNOSIS — I214 Non-ST elevation (NSTEMI) myocardial infarction: Secondary | ICD-10-CM | POA: Insufficient documentation

## 2021-03-01 NOTE — Progress Notes (Signed)
Cardiac Individual Treatment Plan  Patient Details  Name: Corey Woods MRN: [id number] Date of Birth: [birth year] Referring Provider:   Flowsheet Row CARDIAC REHAB PHASE II ORIENTATION from 01/25/2021 in [place]  Referring Provider [name]       Initial Encounter Date:  Flowsheet Row CARDIAC REHAB PHASE II ORIENTATION from 01/25/2021 in [place]  Date 01/25/21       Visit Diagnosis: S/P CABG x 4  Patient's Home Medications on Admission:  Current Outpatient Medications:    albuterol (VENTOLIN HFA) 108 (90 Base) MCG/ACT inhaler, albuterol sulfate HFA 90 mcg/actuation aerosol inhaler  Inhale 2 puffs every 4 hours by inhalation route as needed., Disp: , Rfl:    amiodarone (PACERONE) 200 MG tablet, Take 1 tablet (200 mg total) by mouth daily., Disp: 90 tablet, Rfl: 2   aspirin EC 81 MG EC tablet, Take 1 tablet (81 mg total) by mouth daily. Swallow whole., Disp: , Rfl:    atorvastatin (LIPITOR) 80 MG tablet, Take 1 tablet (80 mg total) by mouth daily at 6 PM., Disp: 90 tablet, Rfl: 2   buPROPion (WELLBUTRIN XL) 300 MG 24 hr tablet, Take 300 mg by mouth daily., Disp: , Rfl:    clopidogrel (PLAVIX) 75 MG tablet, Take 1 tablet (75 mg total) by mouth daily., Disp: 90 tablet, Rfl: 2   DULoxetine (CYMBALTA) 60 MG capsule, Take 120 mg by mouth daily., Disp: , Rfl:    levothyroxine (SYNTHROID) 25 MCG tablet, Take 25 mcg by mouth daily before breakfast., Disp: , Rfl:    lisinopril (ZESTRIL) 2.5 MG tablet, Take 2.5 mg by mouth daily., Disp: , Rfl:    metFORMIN (GLUCOPHAGE) 500 MG tablet, Take 500 mg by mouth daily., Disp: , Rfl:    metoprolol tartrate (LOPRESSOR) 50 MG tablet, Take 1 tablet (50 mg total) by mouth 2 (two) times daily., Disp: 180 tablet, Rfl: 2   montelukast (SINGULAIR) 10 MG tablet, Take 10 mg by mouth at bedtime., Disp: , Rfl:    tamsulosin (FLOMAX) 0.4 MG CAPS capsule, Take 0.4 mg by mouth daily., Disp: , Rfl:     TESTOSTERONE AQUEOUS IM, Inject 400 mg into the muscle See admin instructions. 400 mg once a week every Friday, Disp: , Rfl:   Past Medical History: Past Medical History:  Diagnosis Date   Asthmatic bronchitis    Benign bladder tumor    CAD (coronary artery disease) 2002   BMS x 2 and DES - [place] (multivessel)   Chronic renal failure, stage 3 (moderate), unspecified whether stage 3a or 3b CKD ([place])    Depression    MI (myocardial infarction) ([place]) 2002   Orthostatic hypotension 2002   Prediabetes    Statin intolerance    Crestor - myalgias    Tobacco Use: Social History   Tobacco Use  Smoking Status Former   Types: Cigarettes   Quit date: 2002   Years since quitting: 21.1  Smokeless Tobacco Never    Labs: Recent Review Scientist, physiological     Labs for ITP Cardiac and Pulmonary Rehab Latest Ref Rng & Units 11/23/2020 11/23/2020 11/23/2020 11/23/2020 11/27/2020   Cholestrol 0 - 200 mg/dL - - - - -   LDLCALC 0 - 99 mg/dL - - - - -   HDL >40 mg/dL - - - - -   Trlycerides <150 mg/dL - - - - -   Hemoglobin A1c 4.8 - 5.6 % - - - - -   PHART 7.350 -  7.450 7.334(L) 7.284(L) 7.318(L) 7.314(L) 7.486(H)   PCO2ART 32.0 - 48.0 mmHg 37.5 40.6 41.7 41.7 32.0   HCO3 20.0 - 28.0 mmol/L 20.1 19.4(L) 21.4 21.1 23.9   TCO2 22 - 32 mmol/L 21(L) 21(L) 23 22 -   ACIDBASEDEF 0.0 - 2.0 mmol/L 5.0(H) 7.0(H) 5.0(H) 5.0(H) -   O2SAT % 93.0 97.0 96.0 95.0 99.0       Capillary Blood Glucose: Lab Results  Component Value Date   GLUCAP 221 (H) 11/30/2020   GLUCAP 143 (H) 11/30/2020   GLUCAP 173 (H) 11/29/2020   GLUCAP 173 (H) 11/29/2020   GLUCAP 193 (H) 11/29/2020    POCT Glucose     Row Name 01/25/21 1342 01/30/21 1559           POCT Blood Glucose   Pre-Exercise 98 mg/dL 110 mg/dL               Exercise Target Goals: Exercise Program Goal: Individual exercise prescription set using results from initial 6 min walk test and THRR while considering  patients activity barriers  and safety.   Exercise Prescription Goal: Starting with aerobic activity 30 plus minutes a day, 3 days per week for initial exercise prescription. Provide home exercise prescription and guidelines that participant acknowledges understanding prior to discharge.  Activity Barriers & Risk Stratification:  Activity Barriers & Cardiac Risk Stratification - 01/25/21 1312       Activity Barriers & Cardiac Risk Stratification   Activity Barriers Arthritis;Back Problems;Deconditioning;Balance Concerns    Cardiac Risk Stratification High             6 Minute Walk:  6 Minute Walk     Row Name 01/25/21 1406         6 Minute Walk   Phase Initial     Distance 1200 feet     Walk Time 6 minutes     # of Rest Breaks 0     MPH 2.3     METS 2.27     RPE 12     VO2 Peak 7.96     Symptoms Yes (comment)     Comments slight lower back discomfort     Resting HR 74 bpm     Resting BP 124/64     Resting Oxygen Saturation  97 %     Exercise Oxygen Saturation  during 6 min walk 96 %     Max Ex. HR 97 bpm     Max Ex. BP 140/60     2 Minute Post BP 128/60              Oxygen Initial Assessment:   Oxygen Re-Evaluation:   Oxygen Discharge (Final Oxygen Re-Evaluation):   Initial Exercise Prescription:  Initial Exercise Prescription - 01/25/21 1400       Date of Initial Exercise RX and Referring Provider   Date 01/25/21    Referring Provider [name]    Expected Discharge Date 04/14/21      NuStep   Level 1    SPM 60    Minutes 22      Arm Ergometer   Level 1    RPM 50    Minutes 17      Prescription Details   Frequency (times per week) 3    Duration Progress to 30 minutes of continuous aerobic without signs/symptoms of physical distress      Intensity   THRR 40-80% of Max Heartrate 59-118    Ratings of Perceived Exertion 11-13  Perceived Dyspnea 0-4      Resistance Training   Training Prescription Yes    Weight 3    Reps 10-15              Perform Capillary Blood Glucose checks as needed.  Exercise Prescription Changes:   Exercise Prescription Changes     Row Name 01/30/21 1500 02/06/21 1100 02/13/21 1300 02/27/21 1200       Response to Exercise   Blood Pressure (Admit) 124/72 -- 113/60 120/68    Blood Pressure (Exercise) 140/70 -- 100/50 112/60    Blood Pressure (Exit) 106/74 -- 122/74 100/68    Heart Rate (Admit) 81 bpm -- 88 bpm 87 bpm    Heart Rate (Exercise) 105 bpm -- 109 bpm 106 bpm    Heart Rate (Exit) 88 bpm -- 94 bpm 87 bpm    Rating of Perceived Exertion (Exercise) 13 -- 12 11    Duration Continue with 30 min of aerobic exercise without signs/symptoms of physical distress. -- Continue with 30 min of aerobic exercise without signs/symptoms of physical distress. Continue with 30 min of aerobic exercise without signs/symptoms of physical distress.    Intensity THRR unchanged -- THRR unchanged THRR unchanged      Progression   Progression Continue to progress workloads to maintain intensity without signs/symptoms of physical distress. -- Continue to progress workloads to maintain intensity without signs/symptoms of physical distress. Continue to progress workloads to maintain intensity without signs/symptoms of physical distress.      Resistance Training   Training Prescription Yes -- Yes Yes    Weight 3 -- 4 5    Reps 10-15 -- 10-15 10-15    Time 10 Minutes -- 10 Minutes 10 Minutes      NuStep   Level 1 -- 2 2    SPM 76 -- 97 102    Minutes 17 -- 17 17    METs 1.75 -- 2.01 2.07      Arm Ergometer   Level 1 -- 2 2    RPM 28 -- 39 44    Minutes 22 -- 22 22    METs 1.34 -- 1.59 1.69      Home Exercise Plan   Plans to continue exercise at -- Home (comment) -- --    Frequency -- Add 2 additional days to program exercise sessions. -- --    Initial Home Exercises Provided -- 02/06/21 -- --             Exercise Comments:   Exercise Comments     Row Name 02/06/21 1137           Exercise  Comments home exercise reviewed                Exercise Goals and Review:   Exercise Goals     Row Name 01/25/21 1410 01/30/21 1601 02/27/21 1245         Exercise Goals   Increase Physical Activity Yes Yes Yes     Intervention Provide advice, education, support and counseling about physical activity/exercise needs.;Develop an individualized exercise prescription for aerobic and resistive training based on initial evaluation findings, risk stratification, comorbidities and participant's personal goals. Provide advice, education, support and counseling about physical activity/exercise needs.;Develop an individualized exercise prescription for aerobic and resistive training based on initial evaluation findings, risk stratification, comorbidities and participant's personal goals. Provide advice, education, support and counseling about physical activity/exercise needs.;Develop an individualized exercise prescription for aerobic and resistive training based  on initial evaluation findings, risk stratification, comorbidities and participant's personal goals.     Expected Outcomes Short Term: Attend rehab on a regular basis to increase amount of physical activity.;Long Term: Exercising regularly at least 3-5 days a week.;Long Term: Add in home exercise to make exercise part of routine and to increase amount of physical activity. Short Term: Attend rehab on a regular basis to increase amount of physical activity.;Long Term: Exercising regularly at least 3-5 days a week.;Long Term: Add in home exercise to make exercise part of routine and to increase amount of physical activity. Short Term: Attend rehab on a regular basis to increase amount of physical activity.;Long Term: Exercising regularly at least 3-5 days a week.;Long Term: Add in home exercise to make exercise part of routine and to increase amount of physical activity.     Increase Strength and Stamina Yes Yes Yes     Intervention Provide advice,  education, support and counseling about physical activity/exercise needs.;Develop an individualized exercise prescription for aerobic and resistive training based on initial evaluation findings, risk stratification, comorbidities and participant's personal goals. Provide advice, education, support and counseling about physical activity/exercise needs.;Develop an individualized exercise prescription for aerobic and resistive training based on initial evaluation findings, risk stratification, comorbidities and participant's personal goals. Provide advice, education, support and counseling about physical activity/exercise needs.;Develop an individualized exercise prescription for aerobic and resistive training based on initial evaluation findings, risk stratification, comorbidities and participant's personal goals.     Expected Outcomes Short Term: Increase workloads from initial exercise prescription for resistance, speed, and METs.;Short Term: Perform resistance training exercises routinely during rehab and add in resistance training at home;Long Term: Improve cardiorespiratory fitness, muscular endurance and strength as measured by increased METs and functional capacity (6MWT) Short Term: Increase workloads from initial exercise prescription for resistance, speed, and METs.;Short Term: Perform resistance training exercises routinely during rehab and add in resistance training at home;Long Term: Improve cardiorespiratory fitness, muscular endurance and strength as measured by increased METs and functional capacity (6MWT) Short Term: Increase workloads from initial exercise prescription for resistance, speed, and METs.;Short Term: Perform resistance training exercises routinely during rehab and add in resistance training at home;Long Term: Improve cardiorespiratory fitness, muscular endurance and strength as measured by increased METs and functional capacity (6MWT)     Able to understand and use rate of perceived  exertion (RPE) scale Yes Yes Yes     Intervention Provide education and explanation on how to use RPE scale Provide education and explanation on how to use RPE scale Provide education and explanation on how to use RPE scale     Expected Outcomes Long Term:  Able to use RPE to guide intensity level when exercising independently;Short Term: Able to use RPE daily in rehab to express subjective intensity level Long Term:  Able to use RPE to guide intensity level when exercising independently;Short Term: Able to use RPE daily in rehab to express subjective intensity level Long Term:  Able to use RPE to guide intensity level when exercising independently;Short Term: Able to use RPE daily in rehab to express subjective intensity level     Knowledge and understanding of Target Heart Rate Range (THRR) Yes Yes Yes     Intervention Provide education and explanation of THRR including how the numbers were predicted and where they are located for reference Provide education and explanation of THRR including how the numbers were predicted and where they are located for reference Provide education and explanation of THRR including how the  numbers were predicted and where they are located for reference     Expected Outcomes Short Term: Able to state/look up THRR;Long Term: Able to use THRR to govern intensity when exercising independently;Short Term: Able to use daily as guideline for intensity in rehab Short Term: Able to state/look up THRR;Long Term: Able to use THRR to govern intensity when exercising independently;Short Term: Able to use daily as guideline for intensity in rehab Short Term: Able to state/look up THRR;Long Term: Able to use THRR to govern intensity when exercising independently;Short Term: Able to use daily as guideline for intensity in rehab     Able to check pulse independently Yes Yes Yes     Intervention Provide education and demonstration on how to check pulse in carotid and radial arteries.;Review  the importance of being able to check your own pulse for safety during independent exercise Provide education and demonstration on how to check pulse in carotid and radial arteries.;Review the importance of being able to check your own pulse for safety during independent exercise Provide education and demonstration on how to check pulse in carotid and radial arteries.;Review the importance of being able to check your own pulse for safety during independent exercise     Expected Outcomes Short Term: Able to explain why pulse checking is important during independent exercise Short Term: Able to explain why pulse checking is important during independent exercise Short Term: Able to explain why pulse checking is important during independent exercise     Understanding of Exercise Prescription Yes Yes Yes     Intervention Provide education, explanation, and written materials on patient's individual exercise prescription Provide education, explanation, and written materials on patient's individual exercise prescription Provide education, explanation, and written materials on patient's individual exercise prescription     Expected Outcomes Short Term: Able to explain program exercise prescription;Long Term: Able to explain home exercise prescription to exercise independently Short Term: Able to explain program exercise prescription;Long Term: Able to explain home exercise prescription to exercise independently Short Term: Able to explain program exercise prescription;Long Term: Able to explain home exercise prescription to exercise independently              Exercise Goals Re-Evaluation :  Exercise Goals Re-Evaluation     Row Name 01/30/21 1602 02/27/21 1245           Exercise Goal Re-Evaluation   Exercise Goals Review Increase Physical Activity;Increase Strength and Stamina;Able to understand and use rate of perceived exertion (RPE) scale;Knowledge and understanding of Target Heart Rate Range  (THRR);Able to check pulse independently;Understanding of Exercise Prescription Increase Physical Activity;Increase Strength and Stamina;Able to understand and use rate of perceived exertion (RPE) scale;Knowledge and understanding of Target Heart Rate Range (THRR);Able to check pulse independently;Understanding of Exercise Prescription      Comments Patient has completed 3 sessions of cardiac rehab. He is deconditioned but he is motivated to progress. He is currently exercising at 1.75 METs. Will continue to monitor and progress as able. Pt has completed 15 sessions of cardiac rehab. He is progressing slowly. He gives good effort most of the time but he is easily distracted. He is very interactive with both staff and his class mates. He is currently exercising at 2.07 METs. Will continue to monitor and progress as able.      Expected Outcomes Through exercise at rehab and at home, the patient will meet their stated goals. Through exercise at rehab and at home, the patient will meet their stated goals.  Discharge Exercise Prescription (Final Exercise Prescription Changes):  Exercise Prescription Changes - 02/27/21 1200       Response to Exercise   Blood Pressure (Admit) 120/68    Blood Pressure (Exercise) 112/60    Blood Pressure (Exit) 100/68    Heart Rate (Admit) 87 bpm    Heart Rate (Exercise) 106 bpm    Heart Rate (Exit) 87 bpm    Rating of Perceived Exertion (Exercise) 11    Duration Continue with 30 min of aerobic exercise without signs/symptoms of physical distress.    Intensity THRR unchanged      Progression   Progression Continue to progress workloads to maintain intensity without signs/symptoms of physical distress.      Resistance Training   Training Prescription Yes    Weight 5    Reps 10-15    Time 10 Minutes      NuStep   Level 2    SPM 102    Minutes 17    METs 2.07      Arm Ergometer   Level 2    RPM 44    Minutes 22    METs 1.69              Nutrition:  Target Goals: Understanding of nutrition guidelines, daily intake of sodium 1500mg , cholesterol 200mg , calories 30% from fat and 7% or less from saturated fats, daily to have 5 or more servings of fruits and vegetables.  Biometrics:  Pre Biometrics - 01/25/21 1407       Pre Biometrics   Height 5\' 10"  (1.778 m)    Weight 105.6 kg    Waist Circumference 45 inches    Hip Circumference 42 inches    Waist to Hip Ratio 1.07 %    BMI (Calculated) 33.4    Triceps Skinfold 12 mm    % Body Fat 31 %    Grip Strength 37.3 kg    Flexibility 0 in    Single Leg Stand 2.5 seconds              Nutrition Therapy Plan and Nutrition Goals:  Nutrition Therapy & Goals - 01/27/21 1010       Personal Nutrition Goals   Comments Patient scored 20 on his diet assessment. We offer 2 educational sessions on heart healthy nutrition with handouts and assistance with RD referral if patient is interested.      Intervention Plan   Intervention Nutrition handout(s) given to patient.    Expected Outcomes Short Term Goal: Understand basic principles of dietary content, such as calories, fat, sodium, cholesterol and nutrients.             Nutrition Assessments:  Nutrition Assessments - 01/25/21 1316       MEDFICTS Scores   Pre Score 20            MEDIFICTS Score Key: ?70 Need to make dietary changes  40-70 Heart Healthy Diet ? 40 Therapeutic Level Cholesterol Diet   Picture Your Plate Scores: <81 Unhealthy dietary pattern with much room for improvement. 41-50 Dietary pattern unlikely to meet recommendations for good health and room for improvement. 51-60 More healthful dietary pattern, with some room for improvement.  >60 Healthy dietary pattern, although there may be some specific behaviors that could be improved.    Nutrition Goals Re-Evaluation:   Nutrition Goals Discharge (Final Nutrition Goals Re-Evaluation):   Psychosocial: Target Goals:  Acknowledge presence or absence of significant depression and/or stress, maximize coping skills, provide positive support  system. Participant is able to verbalize types and ability to use techniques and skills needed for reducing stress and depression.  Initial Review & Psychosocial Screening:  Initial Psych Review & Screening - 01/25/21 1313       Initial Review   Current issues with Current Depression   He is treated with Wellbutrin 300 mg daily and cymbalta 60 mg bid for depression. He reports that this helps tremendously. He formerly took [employer] fast streamer 1.5 mg daily for bi polar disorder but quit due to the cost. He reports no current problems BPD.     Family Dynamics   Good Support System? Yes    Comments His wife is his support system.      Barriers   Psychosocial barriers to participate in program The patient should benefit from training in stress management and relaxation.      Screening Interventions   Interventions Encouraged to exercise;Provide feedback about the scores to participant    Expected Outcomes Long Term goal: The participant improves quality of Life and PHQ9 Scores as seen by post scores and/or verbalization of changes;Short Term goal: Identification and review with participant of any Quality of Life or Depression concerns found by scoring the questionnaire.             Quality of Life Scores:  Quality of Life - 01/25/21 1410       Quality of Life   Select Quality of Life      Quality of Life Scores   Health/Function Pre 16.86 %    Socioeconomic Pre 30 %    Psych/Spiritual Pre 15 %    Family Pre 30 %    GLOBAL Pre 20.21 %            Scores of 19 and below usually indicate a poorer quality of life in these areas.  A difference of  2-3 points is a clinically meaningful difference.  A difference of 2-3 points in the total score of the Quality of Life Index has been associated with significant improvement in overall quality of life, self-image, physical  symptoms, and general health in studies assessing change in quality of life.  PHQ-9: Recent Review Flowsheet Data     Depression screen [facility] 2/9 01/25/2021   Decreased Interest 0   Down, Depressed, Hopeless 0   PHQ - 2 Score 0   Altered sleeping 0   Tired, decreased energy 2   Change in appetite 0   Feeling bad or failure about yourself  0   Trouble concentrating 3   Moving slowly or fidgety/restless 0   Suicidal thoughts 0   PHQ-9 Score 5   Difficult doing work/chores Extremely dIfficult       Interpretation of Total Score  Total Score Depression Severity:  1-4 = Minimal depression, 5-9 = Mild depression, 10-14 = Moderate depression, 15-19 = Moderately severe depression, 20-27 = Severe depression   Psychosocial Evaluation and Intervention:  Psychosocial Evaluation - 01/25/21 1406       Psychosocial Evaluation & Interventions   Interventions Stress management education;Relaxation education;Encouraged to exercise with the program and follow exercise prescription    Comments Pt has no barriers to participating in rehab. He does have depression and is treated with Wellbutrin 300 mg daily and Cymbalta 60 mg bid. He reports that the is helps and he currently is having no issues with his depression. He also has Bi Polar Disorder. He was previously prescribed Braylar 1.5 mg daily, but quit taking it about two months ago  due to the cost. He reports no current issues with his Bi Polar Disorder. He has no other identifiable psychosocial issues. His wife had previously asked for a divorce after 40 years, and he was having issues a few months ago, but they have since reconciled. He scored a 5 on his PHQ-9. He relates this to having symptomatic orthostatic hypotension and having problems with his concentration and memory. He attributes this to taking Amiodarone. He quit taking this two weeks ago, so he is hopeful that these symtoms will go away. He reports that he has a good support system with his  wife. His goals for the program are to lose weight and to improve his physical fitness. He has already lost 30 lbs since his CABG, and he would long term like to lose 30 more. He is eager to start the program.    Expected Outcomes Pt's depression will continue to be treated and he will have no other identifiable psychosocial issues.    Continue Psychosocial Services  No Follow up required             Psychosocial Re-Evaluation:  Psychosocial Re-Evaluation     [place] Name 01/27/21 1013 02/20/21 1313           Psychosocial Re-Evaluation   Current issues with Current Depression  Bipolar Disorder Current Depression      Comments Patient new to the program starting today 1/27. He is currently being treated for depression with Welbutrin 300 mg and Duloxetine 50 mg. He also has bipolar disorder. We will monitor his progress as he goes through the program. Patient has completed 11 sessions and his phychosocial issues continue to be managed. He continues treatment for depression with Welbutrin 300 mg and Duloxetine 50 mg. He also has bipolar disorder which he says is managed. He seems to enjoy coming to class and is very talkative. He demonstrates an interest in improving his health. We will monitor his progress as he goes through the program.      Expected Outcomes Patient will continue to have no psychosocial barriers to participate in the program and his depression and bipolar will continue to be managed. Patient will continue to have no psychosocial barriers to participate in the program and his depression and bipolar will continue to be managed.      Interventions Stress management education;Relaxation education;Encouraged to attend Cardiac Rehabilitation for the exercise Stress management education;Relaxation education;Encouraged to attend Cardiac Rehabilitation for the exercise      Continue Psychosocial Services  No Follow up required No Follow up required               Psychosocial  Discharge (Final Psychosocial Re-Evaluation):  Psychosocial Re-Evaluation - 02/20/21 1313       Psychosocial Re-Evaluation   Current issues with Current Depression    Comments Patient has completed 11 sessions and his phychosocial issues continue to be managed. He continues treatment for depression with Welbutrin 300 mg and Duloxetine 50 mg. He also has bipolar disorder which he says is managed. He seems to enjoy coming to class and is very talkative. He demonstrates an interest in improving his health. We will monitor his progress as he goes through the program.    Expected Outcomes Patient will continue to have no psychosocial barriers to participate in the program and his depression and bipolar will continue to be managed.    Interventions Stress management education;Relaxation education;Encouraged to attend Cardiac Rehabilitation for the exercise    Continue Psychosocial Services  No  Follow up required             Vocational Rehabilitation: Provide vocational rehab assistance to qualifying candidates.   Vocational Rehab Evaluation & Intervention:  Vocational Rehab - 01/25/21 1325       Initial Vocational Rehab Evaluation & Intervention   Assessment shows need for Vocational Rehabilitation No             Education: Education Goals: Education classes will be provided on a weekly basis, covering required topics. Participant will state understanding/return demonstration of topics presented.  Learning Barriers/Preferences:  Learning Barriers/Preferences - 01/25/21 1320       Learning Barriers/Preferences   Learning Barriers None    Learning Preferences Individual Instruction;Skilled Demonstration             Education Topics: Hypertension, Hypertension Reduction -Define heart disease and high blood pressure. Discus how high blood pressure affects the body and ways to reduce high blood pressure.   Exercise and Your Heart -Discuss why it is important to exercise,  the FITT principles of exercise, normal and abnormal responses to exercise, and how to exercise safely.   Angina -Discuss definition of angina, causes of angina, treatment of angina, and how to decrease risk of having angina.   Cardiac Medications -Review what the following cardiac medications are used for, how they affect the body, and side effects that may occur when taking the medications.  Medications include Aspirin, Beta blockers, calcium channel blockers, ACE Inhibitors, angiotensin receptor blockers, diuretics, digoxin, and antihyperlipidemics.   Congestive Heart Failure -Discuss the definition of CHF, how to live with CHF, the signs and symptoms of CHF, and how keep track of weight and sodium intake. Flowsheet Row CARDIAC REHAB PHASE II EXERCISE from 02/22/2021 in [place]  Date 02/01/21  Educator DF  Instruction Review Code 2- Demonstrated Understanding       Heart Disease and Intimacy -Discus the effect sexual activity has on the heart, how changes occur during intimacy as we age, and safety during sexual activity. Flowsheet Row CARDIAC REHAB PHASE II EXERCISE from 02/22/2021 in [place]  Date 02/08/21  Educator DF  Instruction Review Code 2- Demonstrated Understanding       Smoking Cessation / COPD -Discuss different methods to quit smoking, the health benefits of quitting smoking, and the definition of COPD. Flowsheet Row CARDIAC REHAB PHASE II EXERCISE from 02/22/2021 in [place]  Date 02/15/21  Educator [place]  Instruction Review Code 1- Verbalizes Understanding       Nutrition I: Fats -Discuss the types of cholesterol, what cholesterol does to the heart, and how cholesterol levels can be controlled. Flowsheet Row CARDIAC REHAB PHASE II EXERCISE from 02/22/2021 in [place]  Date 02/22/21  Educator DF  Instruction Review Code 1- Verbalizes Understanding        Nutrition II: Labels -Discuss the different components of food labels and how to read food label   Heart Parts/Heart Disease and PAD -Discuss the anatomy of the heart, the pathway of blood circulation through the heart, and these are affected by heart disease.   Stress I: Signs and Symptoms -Discuss the causes of stress, how stress may lead to anxiety and depression, and ways to limit stress.   Stress II: Relaxation -Discuss different types of relaxation techniques to limit stress.   Warning Signs of Stroke / TIA -Discuss definition of a stroke, what the signs and symptoms are of a stroke, and how to identify when  someone is having stroke.   Knowledge Questionnaire Score:  Knowledge Questionnaire Score - 01/25/21 1323       Knowledge Questionnaire Score   Pre Score 23/24             Core Components/Risk Factors/Patient Goals at Admission:  Personal Goals and Risk Factors at Admission - 01/25/21 1325       Core Components/Risk Factors/Patient Goals on Admission    Weight Management Yes;Obesity;Weight Loss    Intervention Weight Management: Develop a combined nutrition and exercise program designed to reach desired caloric intake, while maintaining appropriate intake of nutrient and fiber, sodium and fats, and appropriate energy expenditure required for the weight goal.;Weight Management: Provide education and appropriate resources to help participant work on and attain dietary goals.;Weight Management/Obesity: Establish reasonable short term and long term weight goals.;Obesity: Provide education and appropriate resources to help participant work on and attain dietary goals.    Expected Outcomes Short Term: Continue to assess and modify interventions until short term weight is achieved;Long Term: Adherence to nutrition and physical activity/exercise program aimed toward attainment of established weight goal;Weight Maintenance: Understanding of the daily nutrition  guidelines, which includes 25-35% calories from fat, 7% or less cal from saturated fats, less than 200mg  cholesterol, less than 1.5gm of sodium, & 5 or more servings of fruits and vegetables daily;Weight Loss: Understanding of general recommendations for a balanced deficit meal plan, which promotes 1-2 lb weight loss per week and includes a negative energy balance of [phone] kcal/d;Understanding recommendations for meals to include 15-35% energy as protein, 25-35% energy from fat, 35-60% energy from carbohydrates, less than 200mg  of dietary cholesterol, 20-35 gm of total fiber daily;Understanding of distribution of calorie intake throughout the day with the consumption of 4-5 meals/snacks    Personal Goal Other Yes    Personal Goal Improve physical fitness    Intervention Attend cardiac rehab and begin a home exercise program.    Expected Outcomes Pt will improve his physical fitness.             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 He plans to start the program today 1/27. His personal goals for the program are to improve his physical fitness and lose weight. We will monitor his progress as he works towards meeting these goals. Patient has completed 11 sessions. His current weight is 232.5. His blood pressure continues to be at goal. His is doing well in the program with consistent attendance and progression. His personal goals continue to be to improve his physical fitness and lose weight. We will continue to manage his progress as he works towards meeting these goals.      Expected Outcomes Patient will complete the program meeting both personal and program goals. Patient will complete the program  meeting both personal and program goals.               Core Components/Risk Factors/Patient Goals at Discharge (Final Review):   Goals and Risk Factor Review - 02/20/21 1315       Core Components/Risk Factors/Patient Goals Review   Personal Goals Review Weight Management/Obesity;Other    Review Patient has completed 11 sessions. His current weight is 232.5.  His blood pressure continues to be at goal. His is doing well in the program with consistent attendance and progression. His personal goals continue to be to improve his physical fitness and lose weight. We will continue to manage his progress as he works towards meeting these goals.    Expected Outcomes Patient will complete the program meeting both personal and program goals.             ITP Comments:   Comments: ITP REVIEW Pt is making expected progress toward Cardiac Rehab goals after completing 15 sessions. Recommend continued exercise, life style modification, education, and increased stamina and strength.

## 2021-03-01 NOTE — Progress Notes (Signed)
Daily Session Note ? ?Patient Details  ?Name: Corey Woods ?MRN: [id number] ?Date of Birth: [birth year] ?Referring Provider:   ?Flowsheet Row CARDIAC REHAB PHASE II ORIENTATION from 01/25/2021 in [place]  ?Referring Provider [name]  ? ?  ? ? ?Encounter Date: 03/01/2021 ? ?Check In: ? Session Check In - 03/01/21 1100   ? ?  ? Check-In  ? Supervising physician immediately available to respond to emergencies [facility] MD immediately available   ? Physician(s) Dr Harl Bowie   ? Location [facility]   ? Staff Present Hoy Register, MS, ACSM-CEP, Exercise Physiologist;Heather Otho Ket, BS, Exercise Physiologist;Mykhia Danish Wynetta Emery, RN, BSN   ? Virtual Visit No   ? Medication changes reported     No   ? Fall or balance concerns reported    Yes   ? Comments He has Orthostatic hypotension and gets dizzy frequently. He has not fallen though.   ? Tobacco Cessation No Change   ? Warm-up and Cool-down Performed as group-led instruction   ? Resistance Training Performed Yes   ? VAD Patient? No   ? PAD/SET Patient? No   ?  ? Pain Assessment  ? Currently in Pain? No/denies   ? Multiple Pain Sites No   ? ?  ?  ? ?  ? ? ?Capillary Blood Glucose: ?No results found for this or any previous visit (from the past 24 hour(s)). ? ? ? ?Social History  ? ?Tobacco Use  ?Smoking Status Former  ? Types: Cigarettes  ? Quit date: 2002  ? Years since quitting: 21.1  ?Smokeless Tobacco Never  ? ? ?Goals Met:  ?Independence with exercise equipment ?Exercise tolerated well ?No report of concerns or symptoms today ?Strength training completed today ? ?Goals Unmet:  ?Not Applicable ? ?Comments: Check out 1200. ? ? ?Dr. Carlyle Dolly is Medical Director for [place] ?

## 2021-03-03 ENCOUNTER — Encounter (HOSPITAL_COMMUNITY)
Admission: RE | Admit: 2021-03-03 | Discharge: 2021-03-03 | Disposition: A | Discharge status: Home / Self Care | Payer: Medicare Other | Source: Ambulatory Visit | Attending: Cardiology | Admitting: Cardiology

## 2021-03-03 DIAGNOSIS — Z951 Presence of aortocoronary bypass graft: Secondary | ICD-10-CM

## 2021-03-03 DIAGNOSIS — I214 Non-ST elevation (NSTEMI) myocardial infarction: Secondary | ICD-10-CM | POA: Diagnosis not present

## 2021-03-03 NOTE — Progress Notes (Signed)
Daily Session Note ? ?Patient Details  ?Name: Corey Woods ?MRN: [id number] ?Date of Birth: 1945-09-01 ?Referring Provider:   ?Flowsheet Row CARDIAC REHAB PHASE II ORIENTATION from 01/25/2021 in [place]  ?Referring Provider [name]  ? ?  ? ? ?Encounter Date: 03/03/2021 ? ?Check In: ? Session Check In - 03/03/21 1100   ? ?  ? Check-In  ? Supervising physician immediately available to respond to emergencies [facility] MD immediately available   ? Physician(s) Dr Harl Bowie   ? Location [facility]   ? Staff Present Hoy Register, MS, ACSM-CEP, Exercise Physiologist;Debra Wynetta Emery, RN, BSN;Tamarick Kovalcik, RN   ? Virtual Visit No   ? Medication changes reported     No   ? Fall or balance concerns reported    Yes   ? Comments He has Orthostatic hypotension and gets dizzy frequently. He has not fallen though.   ? Tobacco Cessation No Change   ? Warm-up and Cool-down Performed as group-led instruction   ? Resistance Training Performed Yes   ? VAD Patient? No   ? PAD/SET Patient? No   ?  ? Pain Assessment  ? Currently in Pain? No/denies   ? Multiple Pain Sites No   ? ?  ?  ? ?  ? ? ?Capillary Blood Glucose: ?No results found for this or any previous visit (from the past 24 hour(s)). ? ? ? ?Social History  ? ?Tobacco Use  ?Smoking Status Former  ? Types: Cigarettes  ? Quit date: 2002  ? Years since quitting: 21.1  ?Smokeless Tobacco Never  ? ? ?Goals Met:  ?Independence with exercise equipment ?Exercise tolerated well ?No report of concerns or symptoms today ?Strength training completed today ? ?Goals Unmet:  ?Not Applicable ? ?Comments: check out @ 12:00pm ? ? ?Dr. Carlyle Dolly is Medical Director for [place] ?

## 2021-03-06 ENCOUNTER — Encounter (HOSPITAL_COMMUNITY)
Admission: RE | Admit: 2021-03-06 | Discharge: 2021-03-06 | Disposition: A | Discharge status: Home / Self Care | Payer: Medicare Other | Source: Ambulatory Visit | Attending: Cardiology | Admitting: Cardiology

## 2021-03-06 DIAGNOSIS — I214 Non-ST elevation (NSTEMI) myocardial infarction: Secondary | ICD-10-CM | POA: Diagnosis not present

## 2021-03-06 DIAGNOSIS — Z951 Presence of aortocoronary bypass graft: Secondary | ICD-10-CM | POA: Diagnosis not present

## 2021-03-06 NOTE — Progress Notes (Signed)
Daily Session Note ? ?Patient Details  ?Name: Billal Rollo ?MRN: [id number] ?Date of Birth: [birth year] ?Referring Provider:   ?Flowsheet Row CARDIAC REHAB PHASE II ORIENTATION from 01/25/2021 in [place]  ?Referring Provider [name]  ? ?  ? ? ?Encounter Date: 03/06/2021 ? ?Check In: ? Session Check In - 03/06/21 1106   ? ?  ? Check-In  ? Supervising physician immediately available to respond to emergencies [facility] MD immediately available   ? Physician(s) Dr Johnsie Cancel   ? Location [facility]   ? Staff Present Jilda Roche, RN, Bjorn Loser, MS, ACSM-CEP, Exercise Physiologist;Heather Zigmund Amee Boothe, Exercise Physiologist   ? Virtual Visit No   ? Medication changes reported     No   ? Fall or balance concerns reported    Yes   ? Comments He has Orthostatic hypotension and gets dizzy frequently. He has not fallen though.   ? Tobacco Cessation No Change   ? Warm-up and Cool-down Performed as group-led instruction   ? Resistance Training Performed Yes   ? VAD Patient? No   ?  ? Pain Assessment  ? Currently in Pain? No/denies   ? ?  ?  ? ?  ? ? ?Capillary Blood Glucose: ?No results found for this or any previous visit (from the past 24 hour(s)). ? ? ? ?Social History  ? ?Tobacco Use  ?Smoking Status Former  ? Types: Cigarettes  ? Quit date: 2002  ? Years since quitting: 21.1  ?Smokeless Tobacco Never  ? ? ?Goals Met:  ?Independence with exercise equipment ?Exercise tolerated well ?No report of concerns or symptoms today ?Strength training completed today ? ?Goals Unmet:  ?Not Applicable ? ?Comments: checkout 1200  ? ? ?Dr. Carlyle Dolly is Medical Director for [place] ?

## 2021-03-08 ENCOUNTER — Encounter (HOSPITAL_COMMUNITY)
Admission: RE | Admit: 2021-03-08 | Discharge: 2021-03-08 | Disposition: A | Discharge status: Home / Self Care | Payer: Medicare Other | Source: Ambulatory Visit | Attending: Cardiology | Admitting: Cardiology

## 2021-03-08 DIAGNOSIS — Z951 Presence of aortocoronary bypass graft: Secondary | ICD-10-CM | POA: Diagnosis not present

## 2021-03-08 DIAGNOSIS — I214 Non-ST elevation (NSTEMI) myocardial infarction: Secondary | ICD-10-CM | POA: Diagnosis not present

## 2021-03-08 NOTE — Progress Notes (Signed)
Daily Session Note ? ?Patient Details  ?Name: Corey Woods ?MRN: [id number] ?Date of Birth: April 20, 1945 ?Referring Provider:   ?Flowsheet Row CARDIAC REHAB PHASE II ORIENTATION from 01/25/2021 in [place]  ?Referring Provider [name]  ? ?  ? ? ?Encounter Date: 03/08/2021 ? ?Check In: ? Session Check In - 03/08/21 1100   ? ?  ? Check-In  ? Supervising physician immediately available to respond to emergencies [facility] MD immediately available   ? Physician(s) [name]   ? Location [facility]   ? Staff Present Aundra Dubin, RN, BSN;Heather Otho Ket, BS, Exercise Physiologist;Daysie Helf Kris Mouton, MS, ACSM-CEP, Exercise Physiologist   ? Virtual Visit No   ? Medication changes reported     No   ? Fall or balance concerns reported    Yes   ? Comments He has Orthostatic hypotension and gets dizzy frequently. He has not fallen though.   ? Tobacco Cessation No Change   ? Warm-up and Cool-down Performed as group-led instruction   ? Resistance Training Performed Yes   ? VAD Patient? No   ? PAD/SET Patient? No   ?  ? Pain Assessment  ? Currently in Pain? No/denies   ? Multiple Pain Sites No   ? ?  ?  ? ?  ? ? ?Capillary Blood Glucose: ?No results found for this or any previous visit (from the past 24 hour(s)). ? ? ? ?Social History  ? ?Tobacco Use  ?Smoking Status Former  ? Types: Cigarettes  ? Quit date: 2002  ? Years since quitting: 21.1  ?Smokeless Tobacco Never  ? ? ?Goals Met:  ?Independence with exercise equipment ?Exercise tolerated well ?No report of concerns or symptoms today ?Strength training completed today ? ?Goals Unmet:  ?Not Applicable ? ?Comments: checkout time is 1200 ? ? ?Dr. Carlyle Dolly is Medical Director for [place] ?

## 2021-03-10 ENCOUNTER — Encounter (HOSPITAL_COMMUNITY)
Admission: RE | Admit: 2021-03-10 | Discharge: 2021-03-10 | Disposition: A | Discharge status: Home / Self Care | Payer: Medicare Other | Source: Ambulatory Visit | Attending: Cardiology | Admitting: Cardiology

## 2021-03-10 DIAGNOSIS — Z951 Presence of aortocoronary bypass graft: Secondary | ICD-10-CM

## 2021-03-10 DIAGNOSIS — I214 Non-ST elevation (NSTEMI) myocardial infarction: Secondary | ICD-10-CM

## 2021-03-10 NOTE — Progress Notes (Signed)
Daily Session Note ? ?Patient Details  ?Name: Corey Woods ?MRN: [id number] ?Date of Birth: [birth year] ?Referring Provider:   ?Flowsheet Row CARDIAC REHAB PHASE II ORIENTATION from 01/25/2021 in [place]  ?Referring Provider [name]  ? ?  ? ? ?Encounter Date: 03/10/2021 ? ?Check In: ? Session Check In - 03/10/21 1100   ? ?  ? Check-In  ? Supervising physician immediately available to respond to emergencies [facility] MD immediately available   ? Physician(s) [name]   ? Location [facility]   ? Staff Present Hoy Register, MS, ACSM-CEP, Exercise Physiologist;Wynn Kernes Otho Ket, BS, Exercise Physiologist;Debra Wynetta Emery, RN, BSN   ? Virtual Visit No   ? Medication changes reported     No   ? Fall or balance concerns reported    Yes   ? Comments He has Orthostatic hypotension and gets dizzy frequently. He has not fallen though.   ? Tobacco Cessation No Change   ? Warm-up and Cool-down Performed as group-led instruction   ? Resistance Training Performed Yes   ? VAD Patient? No   ? PAD/SET Patient? No   ?  ? Pain Assessment  ? Currently in Pain? No/denies   ? Multiple Pain Sites No   ? ?  ?  ? ?  ? ? ?Capillary Blood Glucose: ?No results found for this or any previous visit (from the past 24 hour(s)). ? ? ? ?Social History  ? ?Tobacco Use  ?Smoking Status Former  ? Types: Cigarettes  ? Quit date: 2002  ? Years since quitting: 21.2  ?Smokeless Tobacco Never  ? ? ?Goals Met:  ?Independence with exercise equipment ?Exercise tolerated well ?No report of concerns or symptoms today ?Strength training completed today ? ?Goals Unmet:  ?Not Applicable ? ?Comments: check out 1200 ? ? ?Dr. Carlyle Dolly is Medical Director for [place] ?

## 2021-03-13 ENCOUNTER — Encounter (HOSPITAL_COMMUNITY)
Admission: RE | Admit: 2021-03-13 | Discharge: 2021-03-13 | Disposition: A | Discharge status: Home / Self Care | Payer: Medicare Other | Source: Ambulatory Visit | Attending: Cardiology | Admitting: Cardiology

## 2021-03-13 VITALS — Wt 231.0 lb

## 2021-03-13 DIAGNOSIS — I214 Non-ST elevation (NSTEMI) myocardial infarction: Secondary | ICD-10-CM

## 2021-03-13 DIAGNOSIS — Z951 Presence of aortocoronary bypass graft: Secondary | ICD-10-CM

## 2021-03-13 NOTE — Progress Notes (Signed)
Daily Session Note ? ?Patient Details  ?Name: Corey Woods ?MRN: [id number] ?Date of Birth: [birth year] ?Referring Provider:   ?Flowsheet Row CARDIAC REHAB PHASE II ORIENTATION from 01/25/2021 in [place]  ?Referring Provider [name]  ? ?  ? ? ?Encounter Date: 03/13/2021 ? ?Check In: ? Session Check In - 03/13/21 1100   ? ?  ? Check-In  ? Supervising physician immediately available to respond to emergencies [facility] MD immediately available   ? Physician(s) Dr. Johney Frame   ? Location [facility]   ? Staff Present Geanie Cooley, RN;Raymondo Garcialopez Kris Mouton, MS, ACSM-CEP, Exercise Physiologist;Debra Wynetta Emery, RN, BSN;Other   Daphyne Hassell Done RN  ? Virtual Visit No   ? Medication changes reported     No   ? Fall or balance concerns reported    Yes   ? Comments He has Orthostatic hypotension and gets dizzy frequently. He has not fallen though.   ? Tobacco Cessation No Change   ? Warm-up and Cool-down Performed as group-led instruction   ? Resistance Training Performed Yes   ? VAD Patient? No   ? PAD/SET Patient? No   ?  ? Pain Assessment  ? Currently in Pain? No/denies   ? Multiple Pain Sites No   ? ?  ?  ? ?  ? ? ?Capillary Blood Glucose: ?No results found for this or any previous visit (from the past 24 hour(s)). ? ? ? ?Social History  ? ?Tobacco Use  ?Smoking Status Former  ? Types: Cigarettes  ? Quit date: 2002  ? Years since quitting: 21.2  ?Smokeless Tobacco Never  ? ? ?Goals Met:  ?Independence with exercise equipment ?Exercise tolerated well ?No report of concerns or symptoms today ?Strength training completed today ? ?Goals Unmet:  ?Not Applicable ? ?Comments: checkout time is 1200 ? ? ?Dr. Carlyle Dolly is Medical Director for [place] ?

## 2021-03-15 ENCOUNTER — Encounter (HOSPITAL_COMMUNITY)
Admission: RE | Admit: 2021-03-15 | Discharge: 2021-03-15 | Disposition: A | Discharge status: Home / Self Care | Payer: Medicare Other | Source: Ambulatory Visit | Attending: Cardiology | Admitting: Cardiology

## 2021-03-15 DIAGNOSIS — I214 Non-ST elevation (NSTEMI) myocardial infarction: Secondary | ICD-10-CM | POA: Diagnosis not present

## 2021-03-15 DIAGNOSIS — Z951 Presence of aortocoronary bypass graft: Secondary | ICD-10-CM | POA: Diagnosis not present

## 2021-03-15 NOTE — Progress Notes (Signed)
Daily Session Note ? ?Patient Details  ?Name: Corey Woods ?MRN: [id number] ?Date of Birth: 08/08/1945 ?Referring Provider:   ?Flowsheet Row CARDIAC REHAB PHASE II ORIENTATION from 01/25/2021 in [place]  ?Referring Provider [name]  ? ?  ? ? ?Encounter Date: 03/15/2021 ? ?Check In: ? Session Check In - 03/15/21 1100   ? ?  ? Check-In  ? Supervising physician immediately available to respond to emergencies [facility] MD immediately available   ? Physician(s) Dr Harl Bowie   ? Location [facility]   ? Staff Present Hoy Register, MS, ACSM-CEP, Exercise Physiologist;Heather Otho Ket, BS, Exercise Physiologist;Debra Wynetta Emery, RN, Joanette Gula, RN, BSN   ? Virtual Visit No   ? Medication changes reported     No   ? Fall or balance concerns reported    Yes   ? Comments He has Orthostatic hypotension and gets dizzy frequently. He has not fallen though.   ? Tobacco Cessation No Change   ? Warm-up and Cool-down Performed as group-led instruction   ? Resistance Training Performed Yes   ? VAD Patient? No   ? PAD/SET Patient? No   ?  ? Pain Assessment  ? Currently in Pain? No/denies   ? Multiple Pain Sites No   ? ?  ?  ? ?  ? ? ?Capillary Blood Glucose: ?No results found for this or any previous visit (from the past 24 hour(s)). ? ? ? ?Social History  ? ?Tobacco Use  ?Smoking Status Former  ? Types: Cigarettes  ? Quit date: 2002  ? Years since quitting: 21.2  ?Smokeless Tobacco Never  ? ? ?Goals Met:  ?Independence with exercise equipment ?Exercise tolerated well ?No report of concerns or symptoms today ?Strength training completed today ? ?Goals Unmet:  ?Not Applicable ? ?Comments: Check out 1200 ? ? ?Dr. Carlyle Dolly is Medical Director for [place] ?

## 2021-03-17 ENCOUNTER — Encounter (HOSPITAL_COMMUNITY)
Admission: RE | Admit: 2021-03-17 | Discharge: 2021-03-17 | Disposition: A | Discharge status: Home / Self Care | Payer: Medicare Other | Source: Ambulatory Visit | Attending: Cardiology | Admitting: Cardiology

## 2021-03-17 DIAGNOSIS — I214 Non-ST elevation (NSTEMI) myocardial infarction: Secondary | ICD-10-CM | POA: Diagnosis not present

## 2021-03-17 DIAGNOSIS — Z951 Presence of aortocoronary bypass graft: Secondary | ICD-10-CM

## 2021-03-17 NOTE — Progress Notes (Signed)
Daily Session Note ? ?Patient Details  ?Name: Corey Woods ?MRN: [id number] ?Date of Birth: Mar 04, 1945 ?Referring Provider:   ?Flowsheet Row CARDIAC REHAB PHASE II ORIENTATION from 01/25/2021 in [place]  ?Referring Provider [name]  ? ?  ? ? ?Encounter Date: 03/17/2021 ? ?Check In: ? Session Check In - 03/17/21 1045   ? ?  ? Check-In  ? Supervising physician immediately available to respond to emergencies [facility] MD immediately available   ? Physician(s) Dr Harl Bowie   ? Location [facility]   ? Staff Present Geanie Cooley, RN;Debra Wynetta Emery, RN, Joanette Gula, RN, Madlyn Frankel, RN, BSN   ? Virtual Visit No   ? Medication changes reported     No   ? Fall or balance concerns reported    Yes   ? Comments He has Orthostatic hypotension and gets dizzy frequently. He has not fallen though.   ? Tobacco Cessation No Change   ? Warm-up and Cool-down Performed as group-led instruction   ? Resistance Training Performed Yes   ? VAD Patient? No   ? PAD/SET Patient? No   ?  ? Pain Assessment  ? Currently in Pain? No/denies   ? Multiple Pain Sites No   ? ?  ?  ? ?  ? ? ?Capillary Blood Glucose: ?No results found for this or any previous visit (from the past 24 hour(s)). ? ? ? ?Social History  ? ?Tobacco Use  ?Smoking Status Former  ? Types: Cigarettes  ? Quit date: 2002  ? Years since quitting: 21.2  ?Smokeless Tobacco Never  ? ? ?Goals Met:  ?Independence with exercise equipment ?Exercise tolerated well ?No report of concerns or symptoms today ?Strength training completed today ? ?Goals Unmet:  ?Not Applicable ? ?Comments: checkout 1200 ? ? ? ?Dr. Carlyle Dolly is Medical Director for [place] ?

## 2021-03-20 ENCOUNTER — Encounter (HOSPITAL_COMMUNITY)
Admission: RE | Admit: 2021-03-20 | Discharge: 2021-03-20 | Disposition: A | Discharge status: Home / Self Care | Payer: Medicare Other | Source: Ambulatory Visit | Attending: Cardiology | Admitting: Cardiology

## 2021-03-20 DIAGNOSIS — I214 Non-ST elevation (NSTEMI) myocardial infarction: Secondary | ICD-10-CM | POA: Diagnosis not present

## 2021-03-20 DIAGNOSIS — Z951 Presence of aortocoronary bypass graft: Secondary | ICD-10-CM

## 2021-03-20 NOTE — Progress Notes (Signed)
Daily Session Note ? ?Patient Details  ?Name: Corey Woods ?MRN: [id number] ?Date of Birth: 1945-12-24 ?Referring Provider:   ?Flowsheet Row CARDIAC REHAB PHASE II ORIENTATION from 01/25/2021 in [place]  ?Referring Provider [name]  ? ?  ? ? ?Encounter Date: 03/20/2021 ? ?Check In: ? Session Check In - 03/20/21 1102   ? ?  ? Check-In  ? Supervising physician immediately available to respond to emergencies [facility] MD immediately available   ? Physician(s) Dr Domenic Polite   ? Location [facility]   ? Staff Present Redge Gainer, BS, Exercise Physiologist;Debra Wynetta Emery, RN, Joanette Gula, RN, Madlyn Frankel, RN, BSN   ? Virtual Visit No   ? Medication changes reported     No   ? Fall or balance concerns reported    Yes   ? Tobacco Cessation No Change   ? Warm-up and Cool-down Performed as group-led instruction   ? Resistance Training Performed Yes   ? VAD Patient? No   ? PAD/SET Patient? No   ?  ? Pain Assessment  ? Currently in Pain? No/denies   ? Multiple Pain Sites No   ? ?  ?  ? ?  ? ? ?Capillary Blood Glucose: ?No results found for this or any previous visit (from the past 24 hour(s)). ? ? ? ?Social History  ? ?Tobacco Use  ?Smoking Status Former  ? Types: Cigarettes  ? Quit date: 2002  ? Years since quitting: 21.2  ?Smokeless Tobacco Never  ? ? ?Goals Met:  ?Independence with exercise equipment ?Exercise tolerated well ?No report of concerns or symptoms today ?Strength training completed today ? ?Goals Unmet:  ?Not Applicable ? ?Comments: checkout 1200 ? ? ? ?Dr. Carlyle Dolly is Medical Director for [place] ?

## 2021-03-22 ENCOUNTER — Encounter (HOSPITAL_COMMUNITY)
Admission: RE | Admit: 2021-03-22 | Discharge: 2021-03-22 | Disposition: A | Discharge status: Home / Self Care | Payer: Medicare Other | Source: Ambulatory Visit | Attending: Cardiology | Admitting: Cardiology

## 2021-03-22 DIAGNOSIS — I214 Non-ST elevation (NSTEMI) myocardial infarction: Secondary | ICD-10-CM

## 2021-03-22 DIAGNOSIS — Z951 Presence of aortocoronary bypass graft: Secondary | ICD-10-CM | POA: Diagnosis not present

## 2021-03-22 NOTE — Progress Notes (Signed)
Daily Session Note ? ?Patient Details  ?Name: Corey Woods ?MRN: [id number] ?Date of Birth: 09-Jun-1945 ?Referring Provider:   ?Flowsheet Row CARDIAC REHAB PHASE II ORIENTATION from 01/25/2021 in [place]  ?Referring Provider [name]  ? ?  ? ? ?Encounter Date: 03/22/2021 ? ?Check In: ? ? ?Capillary Blood Glucose: ?No results found for this or any previous visit (from the past 24 hour(s)). ? ? ? ?Social History  ? ?Tobacco Use  ?Smoking Status Former  ? Types: Cigarettes  ? Quit date: 2002  ? Years since quitting: 21.2  ?Smokeless Tobacco Never  ? ? ?Goals Met:  ?Independence with exercise equipment ?Exercise tolerated well ?No report of concerns or symptoms today ?Strength training completed today ? ?Goals Unmet:  ?Not Applicable ? ?Comments: check out @ 12:00pm ? ? ?Dr. Carlyle Dolly is Medical Director for [place] ?

## 2021-03-24 ENCOUNTER — Encounter (HOSPITAL_COMMUNITY): Payer: Medicare Other

## 2021-03-27 ENCOUNTER — Encounter (HOSPITAL_COMMUNITY)
Admission: RE | Admit: 2021-03-27 | Discharge: 2021-03-27 | Disposition: A | Discharge status: Home / Self Care | Payer: Medicare Other | Source: Ambulatory Visit | Attending: Cardiology | Admitting: Cardiology

## 2021-03-27 VITALS — Wt 229.3 lb

## 2021-03-27 DIAGNOSIS — Z951 Presence of aortocoronary bypass graft: Secondary | ICD-10-CM | POA: Diagnosis not present

## 2021-03-27 DIAGNOSIS — I214 Non-ST elevation (NSTEMI) myocardial infarction: Secondary | ICD-10-CM

## 2021-03-27 NOTE — Progress Notes (Signed)
Daily Session Note ? ?Patient Details  ?Name: Corey Woods ?MRN: [id number] ?Date of Birth: 1945/10/30 ?Referring Provider:   ?Flowsheet Row CARDIAC REHAB PHASE II ORIENTATION from 01/25/2021 in [place]  ?Referring Provider [name]  ? ?  ? ? ?Encounter Date: 03/27/2021 ? ?Check In: ? Session Check In - 03/27/21 1100   ? ?  ? Check-In  ? Supervising physician immediately available to respond to emergencies [facility] MD immediately available   ? Physician(s) Dr Marlou Porch   ? Location [facility]   ? Staff Present Madelyn Flavors, RN, Bjorn Loser, MS, ACSM-CEP, Exercise Physiologist;Heather Zigmund Daniel, Exercise Physiologist   ? Virtual Visit No   ? Medication changes reported     No   ? Fall or balance concerns reported    Yes   ? Comments He has Orthostatic hypotension and gets dizzy frequently. He has not fallen though.   ? Tobacco Cessation No Change   ? Warm-up and Cool-down Not performed (comment)   ? Resistance Training Performed Yes   ? VAD Patient? No   ? PAD/SET Patient? No   ?  ? Pain Assessment  ? Currently in Pain? No/denies   ? Multiple Pain Sites No   ? ?  ?  ? ?  ? ? ?Capillary Blood Glucose: ?No results found for this or any previous visit (from the past 24 hour(s)). ? ? ? ?Social History  ? ?Tobacco Use  ?Smoking Status Former  ? Types: Cigarettes  ? Quit date: 2002  ? Years since quitting: 21.2  ?Smokeless Tobacco Never  ? ? ?Goals Met:  ?Independence with exercise equipment ?Exercise tolerated well ?No report of concerns or symptoms today ?Strength training completed today ? ?Goals Unmet:  ?Not Applicable ? ?Comments: Check out at 1200. ? ? ?Dr. Carlyle Dolly is Medical Director for [place] ?

## 2021-03-29 ENCOUNTER — Encounter (HOSPITAL_COMMUNITY)
Admission: RE | Admit: 2021-03-29 | Discharge: 2021-03-29 | Disposition: A | Discharge status: Home / Self Care | Payer: Medicare Other | Source: Ambulatory Visit | Attending: Cardiology | Admitting: Cardiology

## 2021-03-29 DIAGNOSIS — Z951 Presence of aortocoronary bypass graft: Secondary | ICD-10-CM

## 2021-03-29 DIAGNOSIS — I214 Non-ST elevation (NSTEMI) myocardial infarction: Secondary | ICD-10-CM | POA: Diagnosis not present

## 2021-03-29 NOTE — Progress Notes (Signed)
Daily Session Note ? ?Patient Details  ?Name: Corey Woods ?MRN: [id number] ?Date of Birth: 1945/11/07 ?Referring Provider:   ?Flowsheet Row CARDIAC REHAB PHASE II ORIENTATION from 01/25/2021 in [place]  ?Referring Provider [name]  ? ?  ? ? ?Encounter Date: 03/29/2021 ? ?Check In: ? Session Check In - 03/29/21 1100   ? ?  ? Check-In  ? Supervising physician immediately available to respond to emergencies [facility] MD immediately available   ? Physician(s) Dr Harl Bowie   ? Location [facility]   ? Staff Present Hoy Register, MS, ACSM-CEP, Exercise Physiologist;Deitrich Steve Hassell Done, RN, Jennye Moccasin, RN, Madlyn Frankel, RN, BSN   ? Virtual Visit No   ? Medication changes reported     No   ? Fall or balance concerns reported    Yes   ? Comments He has Orthostatic hypotension and gets dizzy frequently. He has not fallen though.   ? Tobacco Cessation No Change   ? Warm-up and Cool-down Performed as group-led instruction   ? Resistance Training Performed Yes   ? VAD Patient? No   ? PAD/SET Patient? No   ?  ? Pain Assessment  ? Currently in Pain? No/denies   ? Multiple Pain Sites No   ? ?  ?  ? ?  ? ? ?Capillary Blood Glucose: ?No results found for this or any previous visit (from the past 24 hour(s)). ? ? ? ?Social History  ? ?Tobacco Use  ?Smoking Status Former  ? Types: Cigarettes  ? Quit date: 2002  ? Years since quitting: 21.2  ?Smokeless Tobacco Never  ? ? ?Goals Met:  ?Independence with exercise equipment ?Exercise tolerated well ?No report of concerns or symptoms today ?Strength training completed today ? ?Goals Unmet:  ?Not Applicable ? ?Comments: Checkout 1200 ? ? ? ?Dr. Carlyle Dolly is Medical Director for [place] ?

## 2021-03-29 NOTE — Progress Notes (Signed)
? ?Cardiology Office Note   ? ?Date:  04/04/2021  ? ?ID:  Corey Woods, DOB Sep 25, 1945, MRN [id number] ? ? ?PCP:  Celene Squibb, MD ?  ?[place]  ?Cardiologist:  Rozann Lesches, MD   ?Advanced Practice Provider:  No care team member to display ?Electrophysiologist:  None  ? ?[id number]}  ? ?Chief Complaint  ?Patient presents with  ? Follow-up  ? ? ?History of Present Illness:  ?Corey Woods is a 76 y.o. male with CAD, HTN, HLD, elevated blood sugar and CKD stage 3 admitted with chest pain, troponin elevation c/w an NSTEMI.  Patient taken for CABG 11/23/2020.  LVEF 40 to 45% on preop echo, postop improved with no wall motion abnormalities noted but no EF given.  Not started on ACE/ARB/MRA with CKD stage III.  Creatinine 1.67. ?  ?I saw the patient 12/06/20 and was wheezing a lot. Patient called in 12/18/20 with orthostatic hypotension and metoprolol was stopped. ? ?Patient comes in for f/u. Was having a lot of side effects from amiodarone so surgeons stopped it. Was having memory problems. BP still running low. Almost finished with cardiac rehab. Has lost 35 lbs since surgery. Has changed his lifestyle drastically.  ? ? ? ?Past Medical History:  ?Diagnosis Date  ? Asthmatic bronchitis   ? Benign bladder tumor   ? CAD (coronary artery disease) 2002  ? BMS x 2 and DES - [place] (multivessel)  ? Chronic renal failure, stage 3 (moderate), unspecified whether stage 3a or 3b CKD ([place])   ? Depression   ? MI (myocardial infarction) ([place]) 2002  ? Orthostatic hypotension 2002  ? Prediabetes   ? Statin intolerance   ? Crestor - myalgias  ? ? ?Past Surgical History:  ?Procedure Laterality Date  ? CORONARY ANGIOPLASTY WITH STENT PLACEMENT    ? CORONARY ARTERY BYPASS GRAFT N/A 11/23/2020  ? Procedure: CORONARY ARTERY BYPASS GRAFTING (CABG) X FOUR, ON PUMP, USING LEFT INTERNAL MAMMARY ARTERY AND ENDOSCOPIALLY HARVESTED RIGHT GREATER SAPHENOUS VEIN CONDUITS;  Surgeon: Lajuana Matte, MD;   Location: [place];  Service: Open Heart Surgery;  Laterality: N/A;  ? ENDOVEIN HARVEST OF GREATER SAPHENOUS VEIN Right 11/23/2020  ? Procedure: ENDOVEIN HARVEST OF GREATER SAPHENOUS VEIN;  Surgeon: Lajuana Matte, MD;  Location: [place];  Service: Open Heart Surgery;  Laterality: Right;  ? FOOT SURGERY    ? LEFT HEART CATH AND CORONARY ANGIOGRAPHY N/A 11/21/2020  ? Procedure: LEFT HEART CATH AND CORONARY ANGIOGRAPHY;  Surgeon: Martinique, Peter M, MD;  Location: [place] [facility];  Service: Cardiovascular;  Laterality: N/A;  ? TEE WITHOUT CARDIOVERSION N/A 11/23/2020  ? Procedure: TRANSESOPHAGEAL ECHOCARDIOGRAM (TEE);  Surgeon: Lajuana Matte, MD;  Location: [place];  Service: Open Heart Surgery;  Laterality: N/A;  ? TONSILLECTOMY    ? ? ?Current Medications: ?Current Meds  ?Medication Sig  ? albuterol (VENTOLIN HFA) 108 (90 Base) MCG/ACT inhaler albuterol sulfate HFA 90 mcg/actuation aerosol inhaler ? Inhale 2 puffs every 4 hours by inhalation route as needed.  ? aspirin EC 81 MG EC tablet Take 1 tablet (81 mg total) by mouth daily. Swallow whole.  ? atorvastatin (LIPITOR) 20 MG tablet Take 20 mg by mouth daily.  ? buPROPion (WELLBUTRIN XL) 300 MG 24 hr tablet Take 300 mg by mouth daily.  ? clopidogrel (PLAVIX) 75 MG tablet Take 1 tablet (75 mg total) by mouth daily.  ? DULoxetine (CYMBALTA) 60 MG capsule Take 120 mg by mouth daily.  ?  levothyroxine (SYNTHROID) 50 MCG tablet Synthroid 50 mcg tablet ? Take 1 tablet every day by oral route.  ? lisinopril (ZESTRIL) 2.5 MG tablet Take 2.5 mg by mouth daily.  ? metFORMIN (GLUCOPHAGE) 500 MG tablet Take 500 mg by mouth daily.  ? montelukast (SINGULAIR) 10 MG tablet Take 10 mg by mouth at bedtime.  ? tamsulosin (FLOMAX) 0.4 MG CAPS capsule Take 0.4 mg by mouth daily.  ? TESTOSTERONE AQUEOUS IM Inject 400 mg into the muscle See admin instructions. 400 mg once a week every Friday  ? traZODone (DESYREL) 150 MG tablet Take 75 mg by mouth at bedtime.  ?  ? ?Allergies:   Bee  venom and Ciprofloxacin  ? ?Social History  ? ?Socioeconomic History  ? Marital status: Married  ?  Spouse name: Not on file  ? Number of children: Not on file  ? Years of education: Not on file  ? Highest education level: Not on file  ?Occupational History  ? Not on file  ?Tobacco Use  ? Smoking status: Former  ?  Types: Cigarettes  ?  Quit date: 2002  ?  Years since quitting: 21.2  ? Smokeless tobacco: Never  ?Substance and Sexual Activity  ? Alcohol use: Yes  ?  Comment: Regularly - out with friends  ? Drug use: Never  ? Sexual activity: Not on file  ?Other Topics Concern  ? Not on file  ?Social History Narrative  ? Not on file  ? ?Social Determinants of Health  ? ?Financial Resource Strain: Not on file  ?Food Insecurity: Not on file  ?Transportation Needs: Not on file  ?Physical Activity: Not on file  ?Stress: Not on file  ?Social Connections: Not on file  ?  ? ?Family History:  The patient's  family history includes Heart disease in his father; Leukemia in his mother; Stroke in his father.  ? ?ROS:   ?Please see the history of present illness.    ?ROS All other systems reviewed and are negative. ? ? ?PHYSICAL EXAM:   ?VS:  BP (!) 96/50   Pulse 86   Ht '5\' 10"'$  (1.778 m)   Wt 230 lb 3.2 oz (104.4 kg)   SpO2 96%   BMI 33.03 kg/m?   ?Physical Exam  ?GEN: Well nourished, well developed, in no acute distress  ?Neck: no JVD, carotid bruits, or masses ?Cardiac:RRR; no murmurs, rubs, or gallops  ?Respiratory:  clear to auscultation bilaterally, normal work of breathing ?GI: soft, nontender, nondistended, + BS ?Ext: without cyanosis, clubbing, or edema, Good distal pulses bilaterally ?Neuro:  Alert and Oriented x 3, ?Psych: euthymic mood, full affect ? ?Wt Readings from Last 3 Encounters:  ?04/04/21 230 lb 3.2 oz (104.4 kg)  ?03/27/21 229 lb 4.5 oz (104 kg)  ?03/13/21 231 lb 0.7 oz (104.8 kg)  ?  ? ? ?Studies/Labs Reviewed:  ? ?EKG:  EKG is not ordered today.    ? ?Recent Labs: ?11/24/2020: Magnesium 2.5 ?11/27/2020:  TSH 7.437 ?11/28/2020: ALT 18 ?12/28/2020: BUN 24; Creat 1.51; Hemoglobin 14.5; Platelets 239; Potassium 4.2; Sodium 135  ? ?Lipid Panel ?   ?Component Value Date/Time  ? CHOL 244 (H) 11/19/2020 0021  ? TRIG 198 (H) 11/19/2020 0021  ? HDL 59 11/19/2020 0021  ? CHOLHDL 4.1 11/19/2020 0021  ? VLDL 40 11/19/2020 0021  ? [place] 145 (H) 11/19/2020 0021  ? ? ?Additional studies/ records that were reviewed today include:  ?TEE 11/23/2020 ?POST-OP IMPRESSIONS  ?   s/p CABG x 4  ?  _ Left Ventricle: LVEF improved. CO > 5 L/min, CI > 2.5 L/min/m. No RWMA's  ?noted.  ?_ Right Ventricle: The right ventricle appears unchanged from pre-bypass.  ?_ Aorta: Aorta unchanged, no dissection noted after removal of the  ?cannula.  ?_ Left Atrium: The left atrium appears unchanged from pre-bypass.  ?_ Left Atrial Appendage: The left atrial appendage appears unchanged from  ?pre-bypass.  ?_ Aortic Valve: The aortic valve appears unchanged from pre-bypass.  ?_ Mitral Valve: The mitral valve appears unchanged from pre-bypass.  ?_ Tricuspid Valve: The tricuspid valve appears unchanged from pre-bypass.  ?_ Pulmonic Valve: The pulmonic valve appears unchanged from pre-bypass.  ?_ Interatrial Septum: The interatrial septum appears unchanged from  ?pre-bypass.  ?_ Interventricular Septum: The interventricular septum appears unchanged  ?from  ?pre-bypass.  ?_ Pericardium: The pericardium appears unchanged from pre-bypass.  ? ?PRE-OP FINDINGS  ? Left Ventricle: The left ventricle has mild-moderately reduced systolic  ?function, with an ejection fraction of 40-45%. The cavity size was normal.  ?Left ventrical global hypokinesis without regional wall motion  ?abnormalities. There is no left ventricular  ? hypertrophy. Left ventricular diastolic function could not be evaluated.  ? ?Right Ventricle: The right ventricle has slightly reduced systolic  ?function. The cavity was normal. There is no increase in right ventricular  ?wall thickness. There is no  aneurysm seen.  ? ?Left Atrium: Left atrial size was normal in size. No left atrial/left  ?atrial appendage thrombus was detected. The left atrial appendage is well  ?visualized and there is no evidence

## 2021-03-29 NOTE — Progress Notes (Signed)
Cardiac Individual Treatment Plan ? ?Patient Details  ?Name: Corey Woods ?MRN: [id number] ?Date of Birth: 08-Sep-1945 ?Referring Provider:   ?Flowsheet Row CARDIAC REHAB PHASE II ORIENTATION from 01/25/2021 in   ?Referring Provider [name]  ? ?  ? ? ?Initial Encounter Date:  ?Flowsheet Row CARDIAC REHAB PHASE II ORIENTATION from 01/25/2021 in [place]  ?Date 01/25/21  ? ?  ? ? ?Visit Diagnosis: NSTEMI (non-ST elevated myocardial infarction) ([place]) ? ?S/P CABG x 4 ? ?Patient's Home Medications on Admission: ? ?Current Outpatient Medications:  ?  albuterol (VENTOLIN HFA) 108 (90 Base) MCG/ACT inhaler, albuterol sulfate HFA 90 mcg/actuation aerosol inhaler  Inhale 2 puffs every 4 hours by inhalation route as needed., Disp: , Rfl:  ?  amiodarone (PACERONE) 200 MG tablet, Take 1 tablet (200 mg total) by mouth daily., Disp: 90 tablet, Rfl: 2 ?  aspirin EC 81 MG EC tablet, Take 1 tablet (81 mg total) by mouth daily. Swallow whole., Disp: , Rfl:  ?  atorvastatin (LIPITOR) 80 MG tablet, Take 1 tablet (80 mg total) by mouth daily at 6 PM., Disp: 90 tablet, Rfl: 2 ?  buPROPion (WELLBUTRIN XL) 300 MG 24 hr tablet, Take 300 mg by mouth daily., Disp: , Rfl:  ?  clopidogrel (PLAVIX) 75 MG tablet, Take 1 tablet (75 mg total) by mouth daily., Disp: 90 tablet, Rfl: 2 ?  DULoxetine (CYMBALTA) 60 MG capsule, Take 120 mg by mouth daily., Disp: , Rfl:  ?  levothyroxine (SYNTHROID) 25 MCG tablet, Take 25 mcg by mouth daily before breakfast., Disp: , Rfl:  ?  lisinopril (ZESTRIL) 2.5 MG tablet, Take 2.5 mg by mouth daily., Disp: , Rfl:  ?  metFORMIN (GLUCOPHAGE) 500 MG tablet, Take 500 mg by mouth daily., Disp: , Rfl:  ?  metoprolol tartrate (LOPRESSOR) 50 MG tablet, Take 1 tablet (50 mg total) by mouth 2 (two) times daily., Disp: 180 tablet, Rfl: 2 ?  montelukast (SINGULAIR) 10 MG tablet, Take 10 mg by mouth at bedtime., Disp: , Rfl:  ?  tamsulosin (FLOMAX) 0.4 MG CAPS  capsule, Take 0.4 mg by mouth daily., Disp: , Rfl:  ?  TESTOSTERONE AQUEOUS IM, Inject 400 mg into the muscle See admin instructions. 400 mg once a week every Friday, Disp: , Rfl:  ? ?Past Medical History: ?Past Medical History:  ?Diagnosis Date  ? Asthmatic bronchitis   ? Benign bladder tumor   ? CAD (coronary artery disease) 2002  ? BMS x 2 and DES - [place] (multivessel)  ? Chronic renal failure, stage 3 (moderate), unspecified whether stage 3a or 3b CKD ([place])   ? Depression   ? MI (myocardial infarction) ([place]) 2002  ? Orthostatic hypotension 2002  ? Prediabetes   ? Statin intolerance   ? Crestor - myalgias  ? ? ?Tobacco Use: ?Social History  ? ?Tobacco Use  ?Smoking Status Former  ? Types: Cigarettes  ? Quit date: 2002  ? Years since quitting: 21.2  ?Smokeless Tobacco Never  ? ? ?Labs: ?Review Flowsheet   ? ?  ?  Latest Ref Rng & Units 11/19/2020 11/22/2020 11/23/2020 11/27/2020  ?Labs for [facility] Cardiac and Pulmonary Rehab  ?Cholestrol 0 - 200 mg/dL 244       ?LDL (calc) 0 - 99 mg/dL 145       ?HDL-C >40 mg/dL 59       ?Trlycerides <150 mg/dL 198       ?Hemoglobin A1c 4.8 - 5.6 % 6.8       ?  PH, Arterial 7.350 - 7.450  7.417   7.314    ? 7.318    ? 7.284    ? 7.334    ? 7.356    ? 7.434    ? 7.399    ? 7.349   7.486    ?PCO2 arterial 32.0 - 48.0 mmHg  36.0   41.7    ? 41.7    ? 40.6    ? 37.5    ? 45.0    ? 35.6    ? 43.3    ? 49.7   32.0    ?Bicarbonate 20.0 - 28.0 mmol/L  22.8   21.1    ? 21.4    ? 19.4    ? 20.1    ? 25.2    ? 23.9    ? 26.8    ? 26.4    ? 27.4   23.9    ?TCO2 22 - 32 mmol/L   22    ? 23    ? 21    ? 21    ? 24    ? 27    ? 24    ? 25    ? 25    ? 28    ? 28    ? 29    ? 25    ? 28     ?Acid-base deficit 0.0 - 2.0 mmol/L  1.1   5.0    ? 5.0    ? 7.0    ? 5.0    ? 1.0     ?O2 Saturation %  99.0   95.0    ? 96.0    ? 97.0    ? 93.0    ? 100.0    ? 100.0    ? 100.0    ? 79.0    ? 100.0   99.0    ?  ? ? Multiple values from one day are sorted in reverse-chronological order  ?  ?  ? ? ?Capillary  Blood Glucose: ?Lab Results  ?Component Value Date  ? GLUCAP 221 (H) 11/30/2020  ? GLUCAP 143 (H) 11/30/2020  ? GLUCAP 173 (H) 11/29/2020  ? GLUCAP 173 (H) 11/29/2020  ? GLUCAP 193 (H) 11/29/2020  ? ? POCT Glucose   ? ? [place] Name 01/25/21 1342 01/30/21 1559  ?  ?  ?  ?  ? POCT Blood Glucose  ? Pre-Exercise 98 mg/dL 110 mg/dL     ? ?  ?  ? ?  ? ? ?Exercise Target Goals: ?Exercise Program Goal: ?Individual exercise prescription set using results from initial 6 min walk test and THRR while considering  patient?s activity barriers and safety.  ? ?Exercise Prescription Goal: ?Starting with aerobic activity 30 plus minutes a day, 3 days per week for initial exercise prescription. Provide home exercise prescription and guidelines that participant acknowledges understanding prior to discharge. ? ?Activity Barriers & Risk Stratification: ? Activity Barriers & Cardiac Risk Stratification - 01/25/21 1312   ? ?  ? Activity Barriers & Cardiac Risk Stratification  ? Activity Barriers Arthritis;Back Problems;Deconditioning;Balance Concerns   ? Cardiac Risk Stratification High   ? ?  ?  ? ?  ? ? ?6 Minute Walk: ? 6 Minute Walk   ? ? [place] Name 01/25/21 1406  ?  ?  ?  ? 6 Minute Walk  ? Phase Initial    ? Distance 1200 feet    ? Walk Time 6 minutes    ? #  of Rest Breaks 0    ? MPH 2.3    ? METS 2.27    ? RPE 12    ? VO2 Peak 7.96    ? Symptoms Yes (comment)    ? Comments slight lower back discomfort    ? Resting HR 74 bpm    ? Resting BP 124/64    ? Resting Oxygen Saturation  97 %    ? Exercise Oxygen Saturation  during 6 min walk 96 %    ? Max Ex. HR 97 bpm    ? Max Ex. BP 140/60    ? 2 Minute Post BP 128/60    ? ?  ?  ? ?  ? ? ?Oxygen Initial Assessment: ? ? ?Oxygen Re-Evaluation: ? ? ?Oxygen Discharge (Final Oxygen Re-Evaluation): ? ? ?Initial Exercise Prescription: ? Initial Exercise Prescription - 01/25/21 1400   ? ?  ? Date of Initial Exercise RX and Referring Provider  ? Date 01/25/21   ? Referring Provider [name]   ?  Expected Discharge Date 04/14/21   ?  ? NuStep  ? Level 1   ? SPM 60   ? Minutes 22   ?  ? Arm Ergometer  ? Level 1   ? RPM 50   ? Minutes 17   ?  ? Prescription Details  ? Frequency (times per week) 3   ? Duration Progress to 30 minutes of continuous aerobic without signs/symptoms of physical distress   ?  ? Intensity  ? THRR 40-80% of Max Heartrate 59-118   ? Ratings of Perceived Exertion 11-13   ? Perceived Dyspnea 0-4   ?  ? Resistance Training  ? Training Prescription Yes   ? Weight 3   ? Reps 10-15   ? ?  ?  ? ?  ? ? ?Perform Capillary Blood Glucose checks as needed. ? ?Exercise Prescription Changes: ? ? Exercise Prescription Changes   ? ? [place] Name 01/30/21 1500 02/06/21 1100 02/13/21 1300 02/27/21 1200 03/13/21 1244  ?  ? Response to Exercise  ? Blood Pressure (Admit) 124/72 -- 113/60 120/68 140/78  ? Blood Pressure (Exercise) 140/70 -- 100/50 112/60 126/70  ? Blood Pressure (Exit) 106/74 -- 122/74 100/68 128/80  ? Heart Rate (Admit) 81 bpm -- 88 bpm 87 bpm 80 bpm  ? Heart Rate (Exercise) 105 bpm -- 109 bpm 106 bpm 106 bpm  ? Heart Rate (Exit) 88 bpm -- 94 bpm 87 bpm 89 bpm  ? Rating of Perceived Exertion (Exercise) 13 -- '12 11 12  '$ ? Duration Continue with 30 min of aerobic exercise without signs/symptoms of physical distress. -- Continue with 30 min of aerobic exercise without signs/symptoms of physical distress. Continue with 30 min of aerobic exercise without signs/symptoms of physical distress. Continue with 30 min of aerobic exercise without signs/symptoms of physical distress.  ? Intensity THRR unchanged -- THRR unchanged THRR unchanged THRR unchanged  ?  ? Progression  ? Progression Continue to progress workloads to maintain intensity without signs/symptoms of physical distress. -- Continue to progress workloads to maintain intensity without signs/symptoms of physical distress. Continue to progress workloads to maintain intensity without signs/symptoms of physical distress. Continue to progress  workloads to maintain intensity without signs/symptoms of physical distress.  ?  ? Resistance Training  ? Training Prescription Yes -- Yes Yes Yes  ? Weight 3 -- '4 5 8  '$ ? Reps 10-15 -- 10-15 10-15 10-15  ? Time 10 M

## 2021-03-31 ENCOUNTER — Encounter (HOSPITAL_COMMUNITY)
Admission: RE | Admit: 2021-03-31 | Discharge: 2021-03-31 | Disposition: A | Discharge status: Home / Self Care | Payer: Medicare Other | Source: Ambulatory Visit | Attending: Cardiology | Admitting: Cardiology

## 2021-03-31 DIAGNOSIS — I214 Non-ST elevation (NSTEMI) myocardial infarction: Secondary | ICD-10-CM

## 2021-03-31 DIAGNOSIS — Z951 Presence of aortocoronary bypass graft: Secondary | ICD-10-CM

## 2021-03-31 NOTE — Progress Notes (Signed)
Daily Session Note ? ?Patient Details  ?Name: Corey Woods ?MRN: [id number] ?Date of Birth: [birth year] ?Referring Provider:   ?Flowsheet Row CARDIAC REHAB PHASE II ORIENTATION from 01/25/2021 in [facility]  ?Referring Provider [name]  ? ?  ? ? ?Encounter Date: 03/31/2021 ? ?Check In: ? Session Check In - 03/31/21 1058   ? ?  ? Check-In  ? Supervising physician immediately available to respond to emergencies [facility] MD immediately available   ? Physician(s) Dr Harl Bowie   ? Location [facility]   ? Staff Present Aundra Dubin, RN, Joanette Gula, RN, BSN;Christy Edwards, RN, Bjorn Loser, MS, ACSM-CEP, Exercise Physiologist   ? Virtual Visit No   ? Medication changes reported     No   ? Fall or balance concerns reported    Yes   ? Comments He has Orthostatic hypotension and gets dizzy frequently. He has not fallen though.   ? Tobacco Cessation No Change   ? Warm-up and Cool-down Not performed (comment)   ? Resistance Training Performed Yes   ? VAD Patient? No   ? PAD/SET Patient? No   ?  ? Pain Assessment  ? Currently in Pain? No/denies   ? Multiple Pain Sites No   ? ?  ?  ? ?  ? ? ?Capillary Blood Glucose: ?No results found for this or any previous visit (from the past 24 hour(s)). ? ? ? ?Social History  ? ?Tobacco Use  ?Smoking Status Former  ? Types: Cigarettes  ? Quit date: 2002  ? Years since quitting: 21.2  ?Smokeless Tobacco Never  ? ? ?Goals Met:  ?Independence with exercise equipment ?Exercise tolerated well ?No report of concerns or symptoms today ?Strength training completed today ? ?Goals Unmet:  ?Not Applicable ? ?Comments: Check out at 1100. ? ? ?Dr. Carlyle Dolly is Medical Director for [place] ?

## 2021-04-03 ENCOUNTER — Encounter (HOSPITAL_COMMUNITY)
Admission: RE | Admit: 2021-04-03 | Discharge: 2021-04-03 | Disposition: A | Discharge status: Home / Self Care | Payer: Medicare Other | Source: Ambulatory Visit | Attending: Cardiology | Admitting: Cardiology

## 2021-04-03 DIAGNOSIS — Z951 Presence of aortocoronary bypass graft: Secondary | ICD-10-CM | POA: Diagnosis not present

## 2021-04-03 DIAGNOSIS — I214 Non-ST elevation (NSTEMI) myocardial infarction: Secondary | ICD-10-CM | POA: Insufficient documentation

## 2021-04-03 NOTE — Progress Notes (Signed)
Daily Session Note ? ?Patient Details  ?Name: Corey Woods ?MRN: [id number] ?Date of Birth: [birth year] ?Referring Provider:   ?Flowsheet Row CARDIAC REHAB PHASE II ORIENTATION from 01/25/2021 in [place]  ?Referring Provider [name]  ? ?  ? ? ?Encounter Date: 04/03/2021 ? ?Check In: ? Session Check In - 04/03/21 1100   ? ?  ? Check-In  ? Supervising physician immediately available to respond to emergencies [facility] MD immediately available   ? Physician(s) Dr Harl Bowie   ? Location [facility]   ? Staff Present Geanie Cooley, RN;Billye Nydam Hassell Done, RN, Bjorn Loser, MS, ACSM-CEP, Exercise Physiologist;Heather Zigmund Daniel, Exercise Physiologist   ? Virtual Visit No   ? Medication changes reported     No   ? Fall or balance concerns reported    Yes   ? Comments He has Orthostatic hypotension and gets dizzy frequently. He has not fallen though.   ? Tobacco Cessation No Change   ? Warm-up and Cool-down Performed as group-led instruction   ? Resistance Training Performed Yes   ? VAD Patient? No   ? PAD/SET Patient? No   ?  ? Pain Assessment  ? Currently in Pain? No/denies   ? Multiple Pain Sites No   ? ?  ?  ? ?  ? ? ?Capillary Blood Glucose: ?No results found for this or any previous visit (from the past 24 hour(s)). ? ? ? ?Social History  ? ?Tobacco Use  ?Smoking Status Former  ? Types: Cigarettes  ? Quit date: 2002  ? Years since quitting: 21.2  ?Smokeless Tobacco Never  ? ? ?Goals Met:  ?Independence with exercise equipment ?Exercise tolerated well ?No report of concerns or symptoms today ?Strength training completed today ? ?Goals Unmet:  ?Not Applicable ? ?Comments: Check out 1200. ? ? ?Dr. Carlyle Dolly is Medical Director for [place] ?

## 2021-04-04 ENCOUNTER — Other Ambulatory Visit (HOSPITAL_COMMUNITY)
Admission: RE | Admit: 2021-04-04 | Discharge: 2021-04-04 | Disposition: A | Discharge status: Home / Self Care | Payer: Medicare Other | Source: Ambulatory Visit | Attending: Physician Assistant | Admitting: Physician Assistant

## 2021-04-04 ENCOUNTER — Telehealth: Payer: Self-pay | Admitting: Cardiology

## 2021-04-04 ENCOUNTER — Encounter: Payer: Self-pay | Admitting: Physician Assistant

## 2021-04-04 ENCOUNTER — Ambulatory Visit (INDEPENDENT_AMBULATORY_CARE_PROVIDER_SITE_OTHER): Payer: Medicare Other | Admitting: Physician Assistant

## 2021-04-04 ENCOUNTER — Telehealth: Payer: Self-pay | Admitting: *Deleted

## 2021-04-04 VITALS — BP 96/50 | HR 86 | Ht 70.0 in | Wt 230.2 lb

## 2021-04-04 DIAGNOSIS — Z79899 Other long term (current) drug therapy: Secondary | ICD-10-CM

## 2021-04-04 DIAGNOSIS — I480 Paroxysmal atrial fibrillation: Secondary | ICD-10-CM

## 2021-04-04 DIAGNOSIS — I25810 Atherosclerosis of coronary artery bypass graft(s) without angina pectoris: Secondary | ICD-10-CM

## 2021-04-04 DIAGNOSIS — I255 Ischemic cardiomyopathy: Secondary | ICD-10-CM

## 2021-04-04 DIAGNOSIS — N183 Chronic kidney disease, stage 3 unspecified: Secondary | ICD-10-CM | POA: Diagnosis not present

## 2021-04-04 LAB — BASIC METABOLIC PANEL
Anion gap: 6 (ref 5–15)
BUN: 23 mg/dL (ref 8–23)
CO2: 26 mmol/L (ref 22–32)
Calcium: 8.9 mg/dL (ref 8.9–10.3)
Chloride: 104 mmol/L (ref 98–111)
Creatinine, Ser: 1.5 mg/dL — ABNORMAL HIGH (ref 0.61–1.24)
GFR, Estimated: 49 mL/min — ABNORMAL LOW (ref >60)
Glucose, Bld: 106 mg/dL — ABNORMAL HIGH (ref 70–99)
Potassium: 4.5 mmol/L (ref 3.5–5.1)
Sodium: 136 mmol/L (ref 135–145)

## 2021-04-04 NOTE — Telephone Encounter (Signed)
Patient was returning call for results. Please advise °

## 2021-04-04 NOTE — Telephone Encounter (Signed)
Pt notified and order placed 

## 2021-04-04 NOTE — Patient Instructions (Signed)
Medication Instructions:  ?Your physician recommends that you continue on your current medications as directed. Please refer to the Current Medication list given to you today. ? ?*If you need a refill on your cardiac medications before your next appointment, please call your pharmacy* ? ? ?Lab Work: ?BMET today ?If you have labs (blood work) drawn today and your tests are completely normal, you will receive your results only by: ?MyChart Message (if you have MyChart) OR ?A paper copy in the mail ?If you have any lab test that is abnormal or we need to change your treatment, we will call you to review the results. ? ? ?Testing/Procedures: ?Your physician has requested that you have an echocardiogram. Echocardiography is a painless test that uses sound waves to create images of your heart. It provides your doctor with information about the size and shape of your heart and how well your heart?s chambers and valves are working. This procedure takes approximately one hour. There are no restrictions for this procedure. ? ? ? ?Follow-Up: ?At [facility], you and your health needs are our priority.  As part of our continuing mission to provide you with exceptional heart care, we have created designated Provider Care Teams.  These Care Teams include your primary Cardiologist (physician) and Advanced Practice Providers (APPs -  Physician Assistants and Nurse Practitioners) who all work together to provide you with the care you need, when you need it. ? ?We recommend signing up for the patient portal called "MyChart".  Sign up information is provided on this After Visit Summary.  MyChart is used to connect with patients for Virtual Visits (Telemedicine).  Patients are able to view lab/test results, encounter notes, upcoming appointments, etc.  Non-urgent messages can be sent to your provider as well.   ?To learn more about what you can do with MyChart, go to [email or web address].   ? ?Your next appointment:   ?4  month(s) ? ?The format for your next appointment:   ?In Person ? ?Provider:   ?You may see Rozann Lesches, MD or one of the following Advanced Practice Providers on your designated Care Team:   ?Bernerd Pho, PA-C  ?Ermalinda Barrios, PA-C   ?  ?

## 2021-04-04 NOTE — Telephone Encounter (Signed)
-----   Message from April Garrison, [place] sent at 04/04/2021  2:59 PM EDT ----- ? ?----- Message ----- ?From: Imogene Burn, PA-C ?Sent: 04/04/2021   2:33 PM EDT ?To: April Garrison, [place] ? ?Kidney function similar to 3 months ago. With low BP's stop lisinopril and recheck bmet in 3-4 weeks. No other changes, thanks ? ?

## 2021-04-05 ENCOUNTER — Ambulatory Visit (HOSPITAL_COMMUNITY)
Admission: RE | Admit: 2021-04-05 | Discharge: 2021-04-05 | Disposition: A | Discharge status: Home / Self Care | Payer: Medicare Other | Source: Ambulatory Visit | Attending: Physician Assistant | Admitting: Physician Assistant

## 2021-04-05 ENCOUNTER — Encounter (HOSPITAL_COMMUNITY)
Admission: RE | Admit: 2021-04-05 | Discharge: 2021-04-05 | Disposition: A | Discharge status: Home / Self Care | Payer: Medicare Other | Source: Ambulatory Visit | Attending: Cardiology | Admitting: Cardiology

## 2021-04-05 DIAGNOSIS — N183 Chronic kidney disease, stage 3 unspecified: Secondary | ICD-10-CM | POA: Insufficient documentation

## 2021-04-05 DIAGNOSIS — I480 Paroxysmal atrial fibrillation: Secondary | ICD-10-CM | POA: Insufficient documentation

## 2021-04-05 DIAGNOSIS — Z951 Presence of aortocoronary bypass graft: Secondary | ICD-10-CM

## 2021-04-05 DIAGNOSIS — I214 Non-ST elevation (NSTEMI) myocardial infarction: Secondary | ICD-10-CM

## 2021-04-05 DIAGNOSIS — I25810 Atherosclerosis of coronary artery bypass graft(s) without angina pectoris: Secondary | ICD-10-CM

## 2021-04-05 DIAGNOSIS — I501 Left ventricular failure: Secondary | ICD-10-CM

## 2021-04-05 DIAGNOSIS — I255 Ischemic cardiomyopathy: Secondary | ICD-10-CM | POA: Insufficient documentation

## 2021-04-05 LAB — GLUCOSE, CAPILLARY: Glucose-Capillary: 94 mg/dL (ref 70–99)

## 2021-04-05 LAB — ECHOCARDIOGRAM LIMITED
Calc EF: 55.9 %
S' Lateral: 3.4 cm
Single Plane A2C EF: 61.7 %
Single Plane A4C EF: 53.4 %

## 2021-04-05 NOTE — Telephone Encounter (Signed)
Patient clarified when he is to repeat lab work. ?

## 2021-04-05 NOTE — Progress Notes (Addendum)
Daily Session Note ? ?Patient Details  ?Name: Corey Woods ?MRN: [id number] ?Date of Birth: 1945-08-05 ?Referring Provider:   ?Flowsheet Row CARDIAC REHAB PHASE II ORIENTATION from 01/25/2021 in [place]  ?Referring Provider [name]  ? ?  ? ? ?Encounter Date: 04/05/2021 ? ?Check In: ? Session Check In - 04/05/21 1100   ? ?  ? Check-In  ? Supervising physician immediately available to respond to emergencies [facility] MD immediately available   ? Physician(s) Dr Harl Bowie   ? Location [facility]   ? Staff Present Aundra Dubin, RN, Joanette Gula, RN, BSN;Heather Otho Ket, BS, Exercise Physiologist   ? Virtual Visit No   ? Medication changes reported     No   ? Fall or balance concerns reported    Yes   ? Comments He has Orthostatic hypotension and gets dizzy frequently. He has not fallen though.   ? Tobacco Cessation No Change   ? Warm-up and Cool-down Performed as group-led instruction   ? Resistance Training Performed Yes   ? VAD Patient? No   ? PAD/SET Patient? No   ?  ? Pain Assessment  ? Currently in Pain? No/denies   ? Multiple Pain Sites No   ? ?  ?  ? ?  ? ? ?Capillary Blood Glucose: ?Results for orders placed or performed during the hospital encounter of 04/04/21 (from the past 24 hour(s))  ?Basic metabolic panel     Status: Abnormal  ? Collection Time: 04/04/21  1:54 PM  ?Result Value Ref Range  ? Sodium 136 135 - 145 mmol/L  ? Potassium 4.5 3.5 - 5.1 mmol/L  ? Chloride 104 98 - 111 mmol/L  ? CO2 26 22 - 32 mmol/L  ? Glucose, Bld 106 (H) 70 - 99 mg/dL  ? BUN 23 8 - 23 mg/dL  ? Creatinine, Ser 1.50 (H) 0.61 - 1.24 mg/dL  ? Calcium 8.9 8.9 - 10.3 mg/dL  ? GFR, Estimated 49 (L) >60 mL/min  ? Anion gap 6 5 - 15  ? ? ? ? ?Social History  ? ?Tobacco Use  ?Smoking Status Former  ? Types: Cigarettes  ? Quit date: 2002  ? Years since quitting: 21.2  ?Smokeless Tobacco Never  ? ? ?Goals Met:  ?Independence with exercise equipment ?Exercise tolerated well ?No report of  concerns or symptoms today ?Strength training completed today ? ?Goals Unmet:  ?Not Applicable ? ?Comments: Check out 1200.  Pt says he does not feel well.  BP 118/60 and CBG 94.   ? ? ?Dr. Carlyle Dolly is Medical Director for [place] ?

## 2021-04-05 NOTE — Progress Notes (Signed)
*  PRELIMINARY RESULTS* ?Echocardiogram ?2D Echocardiogram has been performed. ? ?Corey Woods ?04/05/2021, 9:17 AM ?

## 2021-04-07 ENCOUNTER — Encounter (HOSPITAL_COMMUNITY)
Admission: RE | Admit: 2021-04-07 | Discharge: 2021-04-07 | Disposition: A | Discharge status: Home / Self Care | Payer: Medicare Other | Source: Ambulatory Visit | Attending: Cardiology | Admitting: Cardiology

## 2021-04-07 DIAGNOSIS — Z951 Presence of aortocoronary bypass graft: Secondary | ICD-10-CM | POA: Diagnosis not present

## 2021-04-07 DIAGNOSIS — I214 Non-ST elevation (NSTEMI) myocardial infarction: Secondary | ICD-10-CM | POA: Diagnosis not present

## 2021-04-07 NOTE — Progress Notes (Signed)
Daily Session Note ? ?Patient Details  ?Name: Corey Woods ?MRN: [id number] ?Date of Birth: 08/28/1945 ?Referring Provider:   ?Flowsheet Row CARDIAC REHAB PHASE II ORIENTATION from 01/25/2021 in [place]  ?Referring Provider [name]  ? ?  ? ? ?Encounter Date: 04/07/2021 ? ?Check In: ? Session Check In - 04/07/21 1100   ? ?  ? Check-In  ? Supervising physician immediately available to respond to emergencies [facility] MD immediately available   ? Physician(s) Dr Harrington Challenger   ? Location [facility]   ? Staff Present Redge Gainer, BS, Exercise Physiologist;Patrena Santalucia Hassell Done, RN, Bjorn Loser, MS, ACSM-CEP, Exercise Physiologist   ? Virtual Visit No   ? Medication changes reported     No   ? Fall or balance concerns reported    Yes   ? Comments He has Orthostatic hypotension and gets dizzy frequently. He has not fallen though.   ? Tobacco Cessation No Change   ? Warm-up and Cool-down Performed as group-led instruction   ? Resistance Training Performed Yes   ? VAD Patient? No   ? PAD/SET Patient? No   ?  ? Pain Assessment  ? Currently in Pain? No/denies   ? Multiple Pain Sites No   ? ?  ?  ? ?  ? ? ?Capillary Blood Glucose: ?No results found for this or any previous visit (from the past 24 hour(s)). ? ? ? ?Social History  ? ?Tobacco Use  ?Smoking Status Former  ? Types: Cigarettes  ? Quit date: 2002  ? Years since quitting: 21.2  ?Smokeless Tobacco Never  ? ? ?Goals Met:  ?Independence with exercise equipment ?Exercise tolerated well ?No report of concerns or symptoms today ?Strength training completed today ? ?Goals Unmet:  ?Not Applicable ? ?Comments: Check out 1200. ? ? ?Dr. Carlyle Dolly is Medical Director for [place] ?

## 2021-04-10 ENCOUNTER — Encounter (HOSPITAL_COMMUNITY)
Admission: RE | Admit: 2021-04-10 | Discharge: 2021-04-10 | Disposition: A | Discharge status: Home / Self Care | Payer: Medicare Other | Source: Ambulatory Visit | Attending: Cardiology | Admitting: Cardiology

## 2021-04-10 VITALS — Wt 231.5 lb

## 2021-04-10 DIAGNOSIS — I214 Non-ST elevation (NSTEMI) myocardial infarction: Secondary | ICD-10-CM | POA: Diagnosis not present

## 2021-04-10 DIAGNOSIS — Z951 Presence of aortocoronary bypass graft: Secondary | ICD-10-CM

## 2021-04-10 NOTE — Progress Notes (Signed)
Daily Session Note ? ?Patient Details  ?Name: Corey Woods ?MRN: [id number] ?Date of Birth: 13-Jul-1945 ?Referring Provider:   ?Flowsheet Row CARDIAC REHAB PHASE II ORIENTATION from 01/25/2021 in [place]  ?Referring Provider [name]  ? ?  ? ? ?Encounter Date: 04/10/2021 ? ?Check In: ? Session Check In - 04/10/21 1056   ? ?  ? Check-In  ? Supervising physician immediately available to respond to emergencies [facility] MD immediately available   ? Physician(s) Dr Johnsie Cancel   ? Location [facility]   ? Staff Present Geanie Cooley, RN;Heather Otho Ket, BS, Exercise Physiologist;Makalynn Berwanger Hassell Done, RN, BSN   ? Virtual Visit No   ? Medication changes reported     No   ? Fall or balance concerns reported    Yes   ? Comments He has Orthostatic hypotension and gets dizzy frequently. He has not fallen though.   ? Tobacco Cessation No Change   ? Warm-up and Cool-down Performed as group-led instruction   ? Resistance Training Performed Yes   ? VAD Patient? No   ? PAD/SET Patient? No   ?  ? Pain Assessment  ? Currently in Pain? No/denies   ? Multiple Pain Sites No   ? ?  ?  ? ?  ? ? ?Capillary Blood Glucose: ?No results found for this or any previous visit (from the past 24 hour(s)). ? ? ? ?Social History  ? ?Tobacco Use  ?Smoking Status Former  ? Types: Cigarettes  ? Quit date: 2002  ? Years since quitting: 21.2  ?Smokeless Tobacco Never  ? ? ?Goals Met:  ?Independence with exercise equipment ?Exercise tolerated well ?No report of concerns or symptoms today ?Strength training completed today ? ?Goals Unmet:  ?Not Applicable ? ?Comments: checkout at 1030. ? ? ?Dr. Carlyle Dolly is Medical Director for [place] ?

## 2021-04-12 ENCOUNTER — Encounter (HOSPITAL_COMMUNITY)
Admission: RE | Admit: 2021-04-12 | Discharge: 2021-04-12 | Disposition: A | Discharge status: Home / Self Care | Payer: Medicare Other | Source: Ambulatory Visit | Attending: Cardiology | Admitting: Cardiology

## 2021-04-12 DIAGNOSIS — I214 Non-ST elevation (NSTEMI) myocardial infarction: Secondary | ICD-10-CM

## 2021-04-12 DIAGNOSIS — Z951 Presence of aortocoronary bypass graft: Secondary | ICD-10-CM

## 2021-04-12 NOTE — Progress Notes (Signed)
Daily Session Note ? ?Patient Details  ?Name: Corey Woods ?MRN: [id number] ?Date of Birth: 07/28/1945 ?Referring Provider:   ?Flowsheet Row CARDIAC REHAB PHASE II ORIENTATION from 01/25/2021 in [place]  ?Referring Provider [name]  ? ?  ? ? ?Encounter Date: 04/12/2021 ? ?Check In: ? Session Check In - 04/12/21 1058   ? ?  ? Check-In  ? Supervising physician immediately available to respond to emergencies [facility] MD immediately available   ? Physician(s) Dr Gasper Sells   ? Location [facility]   ? Staff Present Geanie Cooley, RN;Heather Otho Ket, [place], Exercise Physiologist;Addelynn Batte Hassell Done, RN, Bjorn Loser, MS, ACSM-CEP, Exercise Physiologist   ? Virtual Visit No   ? Medication changes reported     No   ? Fall or balance concerns reported    Yes   ? Comments He has Orthostatic hypotension and gets dizzy frequently. He has not fallen though.   ? Tobacco Cessation No Change   ? Warm-up and Cool-down Performed as group-led instruction   ? Resistance Training Performed Yes   ? VAD Patient? No   ? PAD/SET Patient? No   ?  ? Pain Assessment  ? Currently in Pain? No/denies   ? Multiple Pain Sites No   ? ?  ?  ? ?  ? ? ?Capillary Blood Glucose: ?No results found for this or any previous visit (from the past 24 hour(s)). ? ? ? ?Social History  ? ?Tobacco Use  ?Smoking Status Former  ? Types: Cigarettes  ? Quit date: 2002  ? Years since quitting: 21.2  ?Smokeless Tobacco Never  ? ? ?Goals Met:  ?Independence with exercise equipment ?Exercise tolerated well ?No report of concerns or symptoms today ?Strength training completed today ? ?Goals Unmet:  ?Not Applicable ? ?Comments: Check out at 1200. ? ? ?Dr. Carlyle Dolly is Medical Director for [place] ?

## 2021-04-14 ENCOUNTER — Encounter (HOSPITAL_COMMUNITY)
Admission: RE | Admit: 2021-04-14 | Discharge: 2021-04-14 | Disposition: A | Discharge status: Home / Self Care | Payer: Medicare Other | Source: Ambulatory Visit | Attending: Cardiology | Admitting: Cardiology

## 2021-04-14 DIAGNOSIS — I214 Non-ST elevation (NSTEMI) myocardial infarction: Secondary | ICD-10-CM | POA: Diagnosis not present

## 2021-04-14 DIAGNOSIS — Z951 Presence of aortocoronary bypass graft: Secondary | ICD-10-CM

## 2021-04-14 NOTE — Progress Notes (Signed)
Daily Session Note ? ?Patient Details  ?Name: Corey Woods ?MRN: [id number] ?Date of Birth: 08-04-1945 ?Referring Provider:   ?Flowsheet Row CARDIAC REHAB PHASE II ORIENTATION from 01/25/2021 in [place]  ?Referring Provider [name]  ? ?  ? ? ?Encounter Date: 04/14/2021 ? ?Check In: ? Session Check In - 04/14/21 1100   ? ?  ? Check-In  ? Supervising physician immediately available to respond to emergencies [facility] [place] MD immediately available   ? Physician(s) Dr. Johnsie Cancel   ? Staff Present Redge Gainer, BS, Exercise Physiologist;Bonnita Newby Wynetta Emery, RN, Bjorn Loser, MS, ACSM-CEP, Exercise Physiologist   ? Virtual Visit No   ? Medication changes reported     No   ? Fall or balance concerns reported    Yes   ? Comments He has Orthostatic hypotension and gets dizzy frequently. He has not fallen though.   ? Tobacco Cessation No Change   ? Warm-up and Cool-down Performed as group-led instruction   ? Resistance Training Performed Yes   ? VAD Patient? No   ? PAD/SET Patient? No   ?  ? Pain Assessment  ? Currently in Pain? No/denies   ? Multiple Pain Sites No   ? ?  ?  ? ?  ? ? ?Capillary Blood Glucose: ?No results found for this or any previous visit (from the past 24 hour(s)). ? ? ? ?Social History  ? ?Tobacco Use  ?Smoking Status Former  ? Types: Cigarettes  ? Quit date: 2002  ? Years since quitting: 21.2  ?Smokeless Tobacco Never  ? ? ?Goals Met:  ?Independence with exercise equipment ?Exercise tolerated well ?No report of concerns or symptoms today ?Strength training completed today ? ?Goals Unmet:  ?Not Applicable ? ?Comments: Check out 1200. ? ? ?Dr. Carlyle Dolly is Medical Director for [place] ?

## 2021-04-17 ENCOUNTER — Encounter (HOSPITAL_COMMUNITY)
Admission: RE | Admit: 2021-04-17 | Discharge: 2021-04-17 | Disposition: A | Discharge status: Home / Self Care | Payer: Medicare Other | Source: Ambulatory Visit | Attending: Cardiology | Admitting: Cardiology

## 2021-04-17 VITALS — Ht 70.0 in | Wt 226.9 lb

## 2021-04-17 DIAGNOSIS — I214 Non-ST elevation (NSTEMI) myocardial infarction: Secondary | ICD-10-CM | POA: Diagnosis not present

## 2021-04-17 DIAGNOSIS — Z951 Presence of aortocoronary bypass graft: Secondary | ICD-10-CM

## 2021-04-17 NOTE — Progress Notes (Signed)
Daily Session Note ? ?Patient Details  ?Name: Corey Woods ?MRN: [id number] ?Date of Birth: Mar 31, 1945 ?Referring Provider:   ?Flowsheet Row CARDIAC REHAB PHASE II ORIENTATION from 01/25/2021 in [place]  ?Referring Provider [name]  ? ?  ? ? ?Encounter Date: 04/17/2021 ? ?Check In: ? Session Check In - 04/17/21 1059   ? ?  ? Check-In  ? Supervising physician immediately available to respond to emergencies [facility] MD immediately available   ? Physician(s) Dr. Johnsie Cancel   ? Location [facility]   ? Staff Present Redge Gainer, BS, Exercise Physiologist;Rex Oesterle Hassell Done, RN, Bjorn Loser, MS, ACSM-CEP, Exercise Physiologist   ? Virtual Visit No   ? Medication changes reported     No   ? Fall or balance concerns reported    Yes   ? Comments He has Orthostatic hypotension and gets dizzy frequently. He has not fallen though.   ? Tobacco Cessation No Change   ? Warm-up and Cool-down Performed as group-led instruction   ? Resistance Training Performed Yes   ? VAD Patient? No   ? PAD/SET Patient? No   ?  ? Pain Assessment  ? Currently in Pain? No/denies   ? Multiple Pain Sites No   ? ?  ?  ? ?  ? ? ?Capillary Blood Glucose: ?No results found for this or any previous visit (from the past 24 hour(s)). ? ? ? ?Social History  ? ?Tobacco Use  ?Smoking Status Former  ? Types: Cigarettes  ? Quit date: 2002  ? Years since quitting: 21.3  ?Smokeless Tobacco Never  ? ? ?Goals Met:  ?Independence with exercise equipment ?Exercise tolerated well ?No report of concerns or symptoms today ?Strength training completed today ? ?Goals Unmet:  ?Not Applicable ? ?Comments: Checkout at 1200. ? ? ?Dr. Carlyle Dolly is Medical Director for [place] ?

## 2021-04-19 ENCOUNTER — Encounter (HOSPITAL_COMMUNITY)
Admission: RE | Admit: 2021-04-19 | Discharge: 2021-04-19 | Disposition: A | Discharge status: Home / Self Care | Payer: Medicare Other | Source: Ambulatory Visit | Attending: Cardiology | Admitting: Cardiology

## 2021-04-19 DIAGNOSIS — Z951 Presence of aortocoronary bypass graft: Secondary | ICD-10-CM

## 2021-04-19 DIAGNOSIS — I214 Non-ST elevation (NSTEMI) myocardial infarction: Secondary | ICD-10-CM

## 2021-04-19 NOTE — Progress Notes (Signed)
Daily Session Note ? ?Patient Details  ?Name: Corey Woods ?MRN: [id number] ?Date of Birth: July 07, 1945 ?Referring Provider:   ?Flowsheet Row CARDIAC REHAB PHASE II ORIENTATION from 01/25/2021 in [place]  ?Referring Provider [name]  ? ?  ? ? ?Encounter Date: 04/19/2021 ? ?Check In: ? Session Check In - 04/19/21 1054   ? ?  ? Check-In  ? Supervising physician immediately available to respond to emergencies  [facility] MD immediately available   ? Physician(s) Dr Harl Bowie   ? Location [facility]   ? Staff Present Madelyn Flavors, RN, Bjorn Loser, MS, ACSM-CEP, Exercise Physiologist;Heather Zigmund Daniel, Exercise Physiologist   ? Virtual Visit No   ? Medication changes reported     No   ? Fall or balance concerns reported    Yes   ? Comments He has Orthostatic hypotension and gets dizzy frequently. He has not fallen though.   ? Tobacco Cessation No Change   ? Warm-up and Cool-down Performed as group-led instruction   ? Resistance Training Performed Yes   ? VAD Patient? No   ? PAD/SET Patient? No   ?  ? Pain Assessment  ? Currently in Pain? No/denies   ? Multiple Pain Sites No   ? ?  ?  ? ?  ? ? ?Capillary Blood Glucose: ?No results found for this or any previous visit (from the past 24 hour(s)). ? ? ? ?Social History  ? ?Tobacco Use  ?Smoking Status Former  ? Types: Cigarettes  ? Quit date: 2002  ? Years since quitting: 21.3  ?Smokeless Tobacco Never  ? ? ?Goals Met:  ?Independence with exercise equipment ?Exercise tolerated well ?No report of concerns or symptoms today ?Strength training completed today ? ?Goals Unmet:  ?Not Applicable ? ?Comments:  ?Checkout at 1200. ? ? ?Dr. Carlyle Dolly is Medical Director for [place] ?

## 2021-04-26 NOTE — Progress Notes (Signed)
Discharge Progress Report ? ?Patient Details  ?Name: Corey Woods ?MRN: [id number] ?Date of Birth: 1945-01-20 ?Referring Provider:   ?Flowsheet Row CARDIAC REHAB PHASE II ORIENTATION from 01/25/2021 in [place]  ?Referring Provider [name]  ? ?  ? ? ? ?Number of Visits: 81 ? ?Reason for Discharge:  ?Patient reached a stable level of exercise. ?Patient independent in their exercise. ?Patient has met program and personal goals. ? ?Smoking History:  ?Social History  ? ?Tobacco Use  ?Smoking Status Former  ? Types: Cigarettes  ? Quit date: 2002  ? Years since quitting: 21.3  ?Smokeless Tobacco Never  ? ? ?Diagnosis:  ?NSTEMI (non-ST elevated myocardial infarction) ([place]) ? ?S/P CABG x 4 ? ?ADL UCSD: ? ? ?Initial Exercise Prescription: ? Initial Exercise Prescription - 01/25/21 1400   ? ?  ? Date of Initial Exercise RX and Referring Provider  ? Date 01/25/21   ? Referring Provider [name]   ? Expected Discharge Date 04/14/21   ?  ? NuStep  ? Level 1   ? SPM 60   ? Minutes 22   ?  ? Arm Ergometer  ? Level 1   ? RPM 50   ? Minutes 17   ?  ? Prescription Details  ? Frequency (times per week) 3   ? Duration Progress to 30 minutes of continuous aerobic without signs/symptoms of physical distress   ?  ? Intensity  ? THRR 40-80% of Max Heartrate 59-118   ? Ratings of Perceived Exertion 11-13   ? Perceived Dyspnea 0-4   ?  ? Resistance Training  ? Training Prescription Yes   ? Weight 3   ? Reps 10-15   ? ?  ?  ? ?  ? ? ?Discharge Exercise Prescription (Final Exercise Prescription Changes): ? Exercise Prescription Changes - 04/10/21 1200   ? ?  ? Response to Exercise  ? Blood Pressure (Admit) 142/62   ? Blood Pressure (Exercise) 128/60   ? Blood Pressure (Exit) 120/68   ? Heart Rate (Admit) 84 bpm   ? Heart Rate (Exercise) 112 bpm   ? Heart Rate (Exit) 96 bpm   ? Rating of Perceived Exertion (Exercise) 12   ? Duration Continue with 30 min of aerobic exercise without signs/symptoms of  physical distress.   ? Intensity THRR unchanged   ?  ? Progression  ? Progression Continue to progress workloads to maintain intensity without signs/symptoms of physical distress.   ?  ? Resistance Training  ? Training Prescription Yes   ? Weight 8   ? Reps 10-15   ? Time 10 Minutes   ?  ? Treadmill  ? MPH 1.4   ? Grade 0   ? Minutes 22   ? METs 2.07   ?  ? NuStep  ? Level 5   ? SPM 98   ? Minutes 17   ? METs 2.5   ? ?  ?  ? ?  ? ? ?Functional Capacity: ? 6 Minute Walk   ? ? [place] Name 01/25/21 1406 04/17/21 1130  ?  ?  ? 6 Minute Walk  ? Phase Initial Discharge   ? Distance 1200 feet 1600 feet   ? Distance Feet Change -- 400 ft   ? Walk Time 6 minutes 6 minutes   ? # of Rest Breaks 0 0   ? MPH 2.3 3.03   ? METS 2.27 3.19   ? RPE 12 15   ?  VO2 Peak 7.96 11.17   ? Symptoms Yes (comment) No   ? Comments slight lower back discomfort --   ? Resting HR 74 bpm 85 bpm   ? Resting BP 124/64 116/70   ? Resting Oxygen Saturation  97 % 97 %   ? Exercise Oxygen Saturation  during 6 min walk 96 % 97 %   ? Max Ex. HR 97 bpm 113 bpm   ? Max Ex. BP 140/60 140/78   ? 2 Minute Post BP 128/60 120/70   ? ?  ?  ? ?  ? ? ?Psychological, QOL, Others - Outcomes: ?PHQ 2/9: ? ?  01/25/2021  ?  1:08 PM  ?Depression screen PHQ 2/9  ?Decreased Interest 0  ?Down, Depressed, Hopeless 0  ?PHQ - 2 Score 0  ?Altered sleeping 0  ?Tired, decreased energy 2  ?Change in appetite 0  ?Feeling bad or failure about yourself  0  ?Trouble concentrating 3  ?Moving slowly or fidgety/restless 0  ?Suicidal thoughts 0  ?PHQ-9 Score 5  ?Difficult doing work/chores Extremely dIfficult  ? ? ?Quality of Life: ? Quality of Life - 04/17/21 1132   ? ?  ? Quality of Life  ? Select Quality of Life   ?  ? Quality of Life Scores  ? Health/Function Pre 16.86 %   ? Health/Function Post 25.96 %   ? Health/Function % Change 53.97 %   ? Socioeconomic Pre 30 %   ? Socioeconomic Post 16.63 %   ? Socioeconomic % Change  -44.57 %   ? Psych/Spiritual Pre 15 %   ? Psych/Spiritual Post 20  %   ? Psych/Spiritual % Change 33.33 %   ? Family Pre 30 %   ? Family Post 28.13 %   ? Family % Change -6.23 %   ? GLOBAL Pre 20.21 %   ? GLOBAL Post 23.66 %   ? GLOBAL % Change 17.07 %   ? ?  ?  ? ?  ? ? ?Personal Goals: ?Goals established at orientation with interventions provided to work toward goal. ? Personal Goals and Risk Factors at Admission - 01/25/21 1325   ? ?  ? Core Components/Risk Factors/Patient Goals on Admission  ?  Weight Management Yes;Obesity;Weight Loss   ? Intervention Weight Management: Develop a combined nutrition and exercise program designed to reach desired caloric intake, while maintaining appropriate intake of nutrient and fiber, sodium and fats, and appropriate energy expenditure required for the weight goal.;Weight Management: Provide education and appropriate resources to help participant work on and attain dietary goals.;Weight Management/Obesity: Establish reasonable short term and long term weight goals.;Obesity: Provide education and appropriate resources to help participant work on and attain dietary goals.   ? Expected Outcomes Short Term: Continue to assess and modify interventions until short term weight is achieved;Long Term: Adherence to nutrition and physical activity/exercise program aimed toward attainment of established weight goal;Weight Maintenance: Understanding of the daily nutrition guidelines, which includes 25-35% calories from fat, 7% or less cal from saturated fats, less than 272m cholesterol, less than 1.5gm of sodium, & 5 or more servings of fruits and vegetables daily;Weight Loss: Understanding of general recommendations for a balanced deficit meal plan, which promotes 1-2 lb weight loss per week and includes a negative energy balance of [id number] kcal/d;Understanding recommendations for meals to include 15-35% energy as protein, 25-35% energy from fat, 35-60% energy from carbohydrates, less than 2053mof dietary cholesterol, 20-35 gm of total fiber  daily;Understanding of distribution of calorie  intake throughout the day with the consumption of 4-5 meals/snacks   ? Personal Goal Other Yes   ? Personal Goal Improve physical fitness   ? Intervention Attend cardiac rehab and begin a home exercise program.   ? Expected Outcomes Pt will improve his physical fitness.   ? ?  ?  ? ?  ?  ? ?Personal Goals Discharge: ? Goals and Risk Factor Review   ? ? [place] Name 01/27/21 1011 02/20/21 1315 03/20/21 1041 04/26/21 1326  ?  ?  ? Core Components/Risk Factors/Patient Goals Review  ? Personal Goals Review Weight Management/Obesity;Other Weight Management/Obesity;Other Weight Management/Obesity;Other Weight Management/Obesity;Other   ? Review Patient was referred to CR with NSTEMI and CABGx4. He plans to start the program today 1/27. His personal goals for the program are to improve his physical fitness and lose weight. We will monitor his progress as he works towards meeting these goals. Patient has completed 11 sessions. His current weight is 232.5. His blood pressure continues to be at goal. His is doing well in the program with consistent attendance and progression. His personal goals continue to be to improve his physical fitness and lose weight. We will continue to manage his progress as he works towards meeting these goals. Patient has completed 23 sessions. His current weight is 231.1 down 1 lb since last 30 day review. His blood pressure continues to be at goal. He continues to do well in the program with consistent attendance and progression. He has sent mychart messages to his surgeon with complaints of decrease memory and impaired balance since his surgery wondering if it was caused by any medications. His surgeon's office referred him to his primary cardiologist which he sees in April.  His personal goals continue to be to improve his physical fitness and lose weight. We will continue to manage his progress as he works towards meeting these goals. Pt graduated  after 36 sessions. He lost 6 lbs while in the program. His vitals were WNLs while in the program. He was able to improve his physical fitness and reports that his diet has improved a lot. He still complains of impaired

## 2021-05-04 DIAGNOSIS — R948 Abnormal results of function studies of other organs and systems: Secondary | ICD-10-CM | POA: Diagnosis not present

## 2021-05-04 DIAGNOSIS — E291 Testicular hypofunction: Secondary | ICD-10-CM | POA: Diagnosis not present

## 2021-05-11 DIAGNOSIS — E291 Testicular hypofunction: Secondary | ICD-10-CM | POA: Diagnosis not present

## 2021-05-11 DIAGNOSIS — N5201 Erectile dysfunction due to arterial insufficiency: Secondary | ICD-10-CM | POA: Diagnosis not present

## 2021-05-24 DIAGNOSIS — D509 Iron deficiency anemia, unspecified: Secondary | ICD-10-CM | POA: Diagnosis not present

## 2021-05-24 DIAGNOSIS — E782 Mixed hyperlipidemia: Secondary | ICD-10-CM | POA: Diagnosis not present

## 2021-05-24 DIAGNOSIS — E039 Hypothyroidism, unspecified: Secondary | ICD-10-CM | POA: Diagnosis not present

## 2021-05-24 DIAGNOSIS — R7301 Impaired fasting glucose: Secondary | ICD-10-CM | POA: Diagnosis not present

## 2021-05-24 DIAGNOSIS — I10 Essential (primary) hypertension: Secondary | ICD-10-CM | POA: Diagnosis not present

## 2021-05-24 DIAGNOSIS — E291 Testicular hypofunction: Secondary | ICD-10-CM | POA: Diagnosis not present

## 2021-05-31 DIAGNOSIS — E663 Overweight: Secondary | ICD-10-CM | POA: Diagnosis not present

## 2021-05-31 DIAGNOSIS — I2510 Atherosclerotic heart disease of native coronary artery without angina pectoris: Secondary | ICD-10-CM | POA: Diagnosis not present

## 2021-05-31 DIAGNOSIS — E782 Mixed hyperlipidemia: Secondary | ICD-10-CM | POA: Diagnosis not present

## 2021-05-31 DIAGNOSIS — E291 Testicular hypofunction: Secondary | ICD-10-CM | POA: Diagnosis not present

## 2021-05-31 DIAGNOSIS — I10 Essential (primary) hypertension: Secondary | ICD-10-CM | POA: Diagnosis not present

## 2021-05-31 DIAGNOSIS — N1831 Chronic kidney disease, stage 3a: Secondary | ICD-10-CM | POA: Diagnosis not present

## 2021-05-31 DIAGNOSIS — D509 Iron deficiency anemia, unspecified: Secondary | ICD-10-CM | POA: Diagnosis not present

## 2021-05-31 DIAGNOSIS — F331 Major depressive disorder, recurrent, moderate: Secondary | ICD-10-CM | POA: Diagnosis not present

## 2021-05-31 DIAGNOSIS — R0600 Dyspnea, unspecified: Secondary | ICD-10-CM | POA: Diagnosis not present

## 2021-05-31 DIAGNOSIS — F319 Bipolar disorder, unspecified: Secondary | ICD-10-CM | POA: Diagnosis not present

## 2021-05-31 DIAGNOSIS — J4520 Mild intermittent asthma, uncomplicated: Secondary | ICD-10-CM | POA: Diagnosis not present

## 2021-05-31 DIAGNOSIS — F5221 Male erectile disorder: Secondary | ICD-10-CM | POA: Diagnosis not present

## 2021-08-04 ENCOUNTER — Ambulatory Visit (INDEPENDENT_AMBULATORY_CARE_PROVIDER_SITE_OTHER): Payer: Medicare Other | Admitting: Student

## 2021-08-04 ENCOUNTER — Encounter: Payer: Self-pay | Admitting: Student

## 2021-08-04 VITALS — BP 124/68 | HR 75 | Ht 70.0 in | Wt 223.0 lb

## 2021-08-04 DIAGNOSIS — I25810 Atherosclerosis of coronary artery bypass graft(s) without angina pectoris: Secondary | ICD-10-CM

## 2021-08-04 DIAGNOSIS — I255 Ischemic cardiomyopathy: Secondary | ICD-10-CM | POA: Diagnosis not present

## 2021-08-04 DIAGNOSIS — E785 Hyperlipidemia, unspecified: Secondary | ICD-10-CM | POA: Diagnosis not present

## 2021-08-04 DIAGNOSIS — N1831 Chronic kidney disease, stage 3a: Secondary | ICD-10-CM | POA: Diagnosis not present

## 2021-08-04 MED ORDER — ATORVASTATIN CALCIUM 10 MG PO TABS: 10.0000 mg | ORAL_TABLET | Freq: Every day | ORAL | 3 refills | Status: DC

## 2021-08-04 NOTE — Progress Notes (Signed)
Cardiology Office Note    Date:  08/04/2021   ID:  Corey Woods, Corey Woods May 14, 1945, MRN [id number]  PCP:  Celene Squibb, MD  Cardiologist: Rozann Lesches, MD    Chief Complaint  Patient presents with   Follow-up    4 month visit    History of Present Illness:    Corey Woods is a 76 y.o. male with past medical history of CAD (s/p prior PCI and cath in 11/2020 showing severe 3-vessel CAD --> s/p CABG with LIMA-LAD, rSVG-PDA, SVG-OM2 and SVG-D2), HFimpEF (EF 40-45% in 11/2020, normalized to 55-60% in 04/2021), HLD and Stage 3 CKD who presents to the office today for 77-monthfollow-up.  He was last examined by MErmalinda Barrios PA-C in 04/2021 and denied any recent anginal symptoms at that time. He had lost over 35 pounds with lifestyle changes. He had previously been on Metoprolol but this was stopped in the interim due to orthostatic hypotension. A follow-up limited echocardiogram was obtained and showed a preserved EF of 55 to 60% with no regional motion abnormalities and normal RV function.  In talking with the patient today, he reports he tries to remain active at baseline and [facility]. He denies any recent chest pain or dyspnea on exertion with this. Says his stamina has not fully returned to baseline since undergoing CABG last year. He continues to have issues with short-term memory loss and says this started following CABG. He is unsure if it was due to his surgery or possible medication adjustments at that time. He did stop Amiodarone earlier this year along with stopping Lopressor and Lisinopril due to hypotension and feels like symptoms slightly improved. The only additional medication changes made during his admission were the addition of Plavix and Atorvastatin.   Past Medical History:  Diagnosis Date   Asthmatic bronchitis    Benign bladder tumor    CAD (coronary artery disease)    a. s/p prior PCI in [place]. cath in 11/2020 showing severe  3-vessel CAD --> s/p CABG with LIMA-LAD, rSVG-PDA, SVG-OM2 and SVG-D2   Chronic renal failure, stage 3 (moderate), unspecified whether stage 3a or 3b CKD ([place]    Depression    Ischemic cardiomyopathy    a. EF 40-45% in 11/2020 b. EF normalized to 55-60% in 04/2021   MI ([place]ocardial infarction) ([place] 2002   Orthostatic hypotension 2002   Prediabetes    Statin intolerance    Crestor - myalgias    Past Surgical History:  Procedure Laterality Date   CORONARY ANGIOPLASTY WITH STENT PLACEMENT     CORONARY ARTERY BYPASS GRAFT N/A 11/23/2020   Procedure: CORONARY ARTERY BYPASS GRAFTING (CABG) X FOUR, ON PUMP, USING LEFT INTERNAL MAMMARY ARTERY AND ENDOSCOPIALLY HARVESTED RIGHT GREATER SAPHENOUS VEIN CONDUITS;  Surgeon: LLajuana Matte MD;  [facility]  Service: Open Heart Surgery;  Laterality: N/A;   ENDOVEIN HARVEST OF GREATER SAPHENOUS VEIN Right 11/23/2020   Procedure: ENDOVEIN HARVEST OF GREATER SAPHENOUS VEIN;  Surgeon: LLajuana Matte MD;  [facility]  Service: Open Heart Surgery;  Laterality: Right;   FOOT SURGERY     LEFT HEART CATH AND CORONARY ANGIOGRAPHY N/A 11/21/2020   Procedure: LEFT HEART CATH AND CORONARY ANGIOGRAPHY;  Surgeon: JMartinique Peter M, MD;  [facility];  Service: Cardiovascular;  Laterality: N/A;   TEE WITHOUT CARDIOVERSION N/A 11/23/2020   Procedure: TRANSESOPHAGEAL ECHOCARDIOGRAM (TEE);  Surgeon: LLajuana Matte MD;  [facility]  Service: Open Heart Surgery;  Laterality: N/A;   TONSILLECTOMY      Current Medications: Outpatient Medications Prior to Visit  Medication Sig Dispense Refill   albuterol (VENTOLIN HFA) 108 (90 Base) MCG/ACT inhaler albuterol sulfate HFA 90 mcg/actuation aerosol inhaler  Inhale 2 puffs every 4 hours by inhalation route as needed.     aspirin EC 81 MG EC tablet Take 1 tablet (81 mg total) by mouth daily. Swallow whole.     buPROPion (WELLBUTRIN XL) 300 MG 24 hr tablet Take 300 mg by mouth  daily.     clopidogrel (PLAVIX) 75 MG tablet Take 1 tablet (75 mg total) by mouth daily. 90 tablet 2   DULoxetine (CYMBALTA) 60 MG capsule Take 120 mg by mouth daily.     levothyroxine (SYNTHROID) 50 MCG tablet Synthroid 50 mcg tablet  Take 1 tablet every day by oral route.     metFORMIN (GLUCOPHAGE) 500 MG tablet Take 500 mg by mouth daily.     montelukast (SINGULAIR) 10 MG tablet Take 10 mg by mouth at bedtime.     tamsulosin (FLOMAX) 0.4 MG CAPS capsule Take 0.4 mg by mouth daily.     TESTOSTERONE AQUEOUS IM Inject 400 mg into the muscle See admin instructions. 400 mg once a week every Friday     traZODone (DESYREL) 150 MG tablet Take 75 mg by mouth at bedtime.     atorvastatin (LIPITOR) 20 MG tablet Take 20 mg by mouth daily.     amiodarone (PACERONE) 200 MG tablet Take 1 tablet (200 mg total) by mouth daily. (Patient not taking: Reported on 04/04/2021) 90 tablet 2   metoprolol tartrate (LOPRESSOR) 50 MG tablet Take 1 tablet (50 mg total) by mouth 2 (two) times daily. (Patient not taking: Reported on 04/04/2021) 180 tablet 2   No facility-administered medications prior to visit.     Allergies:   Bee venom, Ciprofloxacin, and Crestor [rosuvastatin]   Social History   Socioeconomic History   Marital status: Married    Spouse name: Not on file   Number of children: Not on file   Years of education: Not on file   Highest education level: Not on file  Occupational History   Not on file  Tobacco Use   Smoking status: Former    Types: Cigarettes    Quit date: 2002    Years since quitting: 21.6   Smokeless tobacco: Never  Vaping Use   Vaping Use: Never used  Substance and Sexual Activity   Alcohol use: Yes    Comment: Regularly - out with friends   Drug use: Never   Sexual activity: Not on file  Other Topics Concern   Not on file  Social History Narrative   Not on file   Social Determinants of Health   Financial Resource Strain: Not on file  Food Insecurity: Not on file   Transportation Needs: Not on file  Physical Activity: Not on file  Stress: Not on file  Social Connections: Not on file     Family History:  The patient's family history includes Heart disease in his father; Leukemia in his mother; Stroke in his father.   Review of Systems:    Please see the history of present illness.     All other systems reviewed and are otherwise negative except as noted above.   Physical Exam:    VS:  BP 124/68   Pulse 75   Ht '5\' 10"'$  (1.778 m)   Wt 223 lb (101.2  kg)   SpO2 94%   BMI 32.00 kg/m    General: Well developed, well nourished,male appearing in no acute distress. Head: Normocephalic, atraumatic. Neck: No carotid bruits. JVD not elevated.  Lungs: Respirations regular and unlabored, without wheezes or rales.  Heart: Regular rate and rhythm. No S3 or S4.  No murmur, no rubs, or gallops appreciated. Abdomen: Appears non-distended. No obvious abdominal masses. Msk:  Strength and tone appear normal for age. No obvious joint deformities or effusions. Extremities: No clubbing or cyanosis. No pitting edema.  Distal pedal pulses are 2+ bilaterally. Neuro: Alert and oriented X 3. Moves all extremities spontaneously. No focal deficits noted. Psych:  Responds to questions appropriately with a normal affect. Skin: No rashes or lesions noted  Wt Readings from Last 3 Encounters:  08/04/21 223 lb (101.2 kg)  04/17/21 226 lb 13.7 oz (102.9 kg)  04/10/21 231 lb 7.7 oz (105 kg)     Studies/Labs Reviewed:   EKG:  EKG is not ordered today.  Recent Labs: 11/24/2020: Magnesium 2.5 11/27/2020: TSH 7.437 11/28/2020: ALT 18 12/28/2020: Hemoglobin 14.5; Platelets 239 04/04/2021: BUN 23; Creatinine, Ser 1.50; Potassium 4.5; Sodium 136   Lipid Panel    Component Value Date/Time   CHOL 244 (H) 11/19/2020 0021   TRIG 198 (H) 11/19/2020 0021   HDL 59 11/19/2020 0021   CHOLHDL 4.1 11/19/2020 0021   VLDL 40 11/19/2020 0021   LDLCALC 145 (H) 11/19/2020 0021     Additional studies/ records that were reviewed today include:   LHC: 11/2020   Prox LAD to Mid LAD lesion is 95% stenosed.   Mid LAD lesion is 50% stenosed.   Dist LAD lesion is 80% stenosed.   1st Diag lesion is 90% stenosed.   Mid Cx to Dist Cx lesion is 80% stenosed.   2nd Mrg lesion is 75% stenosed.   3rd Mrg lesion is 75% stenosed.   Prox RCA to Mid RCA lesion is 90% stenosed.   Mid RCA to Dist RCA lesion is 100% stenosed.   LV end diastolic pressure is normal.   Severe 3 vessel obstructive CAD Normal LVEDP   Plan: CT surgery consult for CABG  Limited Echocardiogram: 04/2021 IMPRESSIONS     1. Left ventricular ejection fraction, by estimation, is 55 to 60%. The  left ventricle has normal function. The left ventricle has no regional  wall motion abnormalities. There is mild left ventricular hypertrophy.   2. Right ventricular systolic function is normal. The right ventricular  size is normal.   3. Limited echo to evaluate LV function   Assessment:    1. Coronary artery disease involving coronary bypass graft of native heart without angina pectoris   2. Ischemic cardiomyopathy   3. Hyperlipidemia LDL goal <70   4. Stage 3a chronic kidney disease ([place])      Plan:   In order of problems listed above:  1. CAD - He is s/p CABG in 11/2020 with LIMA-LAD, rSVG-PDA, SVG-OM2 and SVG-D2. He denies any recent chest pain or progressive dyspnea.  - Continue current medical therapy with ASA 81 mg daily and Plavix 75 mg daily. Will attempt to adjust statin therapy as outlined below. Would anticipate stopping Plavix in 11/2021 once 1 year out from his NSTEMI.  2. HFimpEF - His EF was previously at 40-45% in 11/2020, normalized to 55-60% in 04/2021. He appears euvolemic on examination today and is no longer on beta-blocker therapy or Lisinopril due to issues with hypotension.  3. HLD -  Followed by his PCP. He does report having worsening memory issues as outlined above  since prior CABG and the only medication changes that were made at that time that have not been adjusted yet are Atorvastatin and Plavix. I feel like this would be a very unlikely side effect of Plavix, therefore I recommended he try reducing Atorvastatin to 10 mg daily and starting Co-Q10. If symptoms do not improve, I recommended he follow-up with his PCP for further evaluation and possibly Neurology referral for further work-up of short-term memory loss.  4. Stage 3 CKD - Baseline creatinine appears to be between 1.5 - 1.6. Stable at 1.50 in 04/2021. Will request a copy of his recent labs from his PCP.   Medication Adjustments/Labs and Tests Ordered: Current medicines are reviewed at length with the patient today.  Concerns regarding medicines are outlined above.  Medication changes, Labs and Tests ordered today are listed in the Patient Instructions below. Patient Instructions  Medication Instructions:  Your physician has recommended you make the following change in your medication:  Reduce Atorvastatin to '10mg'$  daily.   Start Over-the-counter Co-Q-10.   Can STOP Plavix in 11/2021.   Labwork: None  Testing/Procedures: None  Follow-Up: Follow up with [name] in 6 months.   Any Other Special Instructions Will Be Listed Below (If Applicable).     If you need a refill on your cardiac medications before your next appointment, please call your pharmacy.    Signed, Erma Heritage, PA-C  08/04/2021 5:00 PM    [facility] [address] [place], [place] [address] Phone: [phone] Fax: [phone]

## 2021-08-04 NOTE — Patient Instructions (Addendum)
Medication Instructions:  Your physician has recommended you make the following change in your medication:  Reduce Atorvastatin to '10mg'$  daily.   Start Over-the-counter Co-Q-10.   Can STOP Plavix in 11/2021.   Labwork: None  Testing/Procedures: None  Follow-Up: Follow up with [name] in 6 months.   Any Other Special Instructions Will Be Listed Below (If Applicable).     If you need a refill on your cardiac medications before your next appointment, please call your pharmacy.

## 2021-08-08 ENCOUNTER — Encounter: Payer: Self-pay | Admitting: Internal Medicine

## 2021-08-16 DIAGNOSIS — X32XXXD Exposure to sunlight, subsequent encounter: Secondary | ICD-10-CM | POA: Diagnosis not present

## 2021-08-16 DIAGNOSIS — L570 Actinic keratosis: Secondary | ICD-10-CM | POA: Diagnosis not present

## 2021-08-16 DIAGNOSIS — L304 Erythema intertrigo: Secondary | ICD-10-CM | POA: Diagnosis not present

## 2021-08-18 ENCOUNTER — Other Ambulatory Visit: Payer: Self-pay | Admitting: Physician Assistant

## 2021-09-07 DIAGNOSIS — Z961 Presence of intraocular lens: Secondary | ICD-10-CM | POA: Diagnosis not present

## 2021-09-07 DIAGNOSIS — H524 Presbyopia: Secondary | ICD-10-CM | POA: Diagnosis not present

## 2021-09-07 DIAGNOSIS — H31011 Macula scars of posterior pole (postinflammatory) (post-traumatic), right eye: Secondary | ICD-10-CM | POA: Diagnosis not present

## 2021-11-13 DIAGNOSIS — R351 Nocturia: Secondary | ICD-10-CM | POA: Diagnosis not present

## 2021-11-13 DIAGNOSIS — R3912 Poor urinary stream: Secondary | ICD-10-CM | POA: Diagnosis not present

## 2021-11-13 DIAGNOSIS — N401 Enlarged prostate with lower urinary tract symptoms: Secondary | ICD-10-CM | POA: Diagnosis not present

## 2021-11-14 DIAGNOSIS — M1711 Unilateral primary osteoarthritis, right knee: Secondary | ICD-10-CM | POA: Diagnosis not present

## 2021-11-14 DIAGNOSIS — M25562 Pain in left knee: Secondary | ICD-10-CM | POA: Diagnosis not present

## 2021-11-14 DIAGNOSIS — M25511 Pain in right shoulder: Secondary | ICD-10-CM | POA: Diagnosis not present

## 2021-11-16 ENCOUNTER — Other Ambulatory Visit: Payer: Self-pay | Admitting: Physician Assistant

## 2021-11-22 DIAGNOSIS — R351 Nocturia: Secondary | ICD-10-CM | POA: Diagnosis not present

## 2021-11-22 DIAGNOSIS — N401 Enlarged prostate with lower urinary tract symptoms: Secondary | ICD-10-CM | POA: Diagnosis not present

## 2021-11-22 DIAGNOSIS — R350 Frequency of micturition: Secondary | ICD-10-CM | POA: Diagnosis not present

## 2021-12-05 DIAGNOSIS — R7303 Prediabetes: Secondary | ICD-10-CM | POA: Diagnosis not present

## 2021-12-05 DIAGNOSIS — E782 Mixed hyperlipidemia: Secondary | ICD-10-CM | POA: Diagnosis not present

## 2021-12-05 DIAGNOSIS — I10 Essential (primary) hypertension: Secondary | ICD-10-CM | POA: Diagnosis not present

## 2021-12-05 DIAGNOSIS — E039 Hypothyroidism, unspecified: Secondary | ICD-10-CM | POA: Diagnosis not present

## 2021-12-06 DIAGNOSIS — N1831 Chronic kidney disease, stage 3a: Secondary | ICD-10-CM | POA: Diagnosis not present

## 2021-12-06 DIAGNOSIS — I10 Essential (primary) hypertension: Secondary | ICD-10-CM | POA: Diagnosis not present

## 2021-12-06 DIAGNOSIS — D509 Iron deficiency anemia, unspecified: Secondary | ICD-10-CM | POA: Diagnosis not present

## 2021-12-06 DIAGNOSIS — F331 Major depressive disorder, recurrent, moderate: Secondary | ICD-10-CM | POA: Diagnosis not present

## 2021-12-06 DIAGNOSIS — F319 Bipolar disorder, unspecified: Secondary | ICD-10-CM | POA: Diagnosis not present

## 2021-12-06 DIAGNOSIS — E291 Testicular hypofunction: Secondary | ICD-10-CM | POA: Diagnosis not present

## 2021-12-06 DIAGNOSIS — E782 Mixed hyperlipidemia: Secondary | ICD-10-CM | POA: Diagnosis not present

## 2021-12-06 DIAGNOSIS — R0600 Dyspnea, unspecified: Secondary | ICD-10-CM | POA: Diagnosis not present

## 2021-12-06 DIAGNOSIS — F5221 Male erectile disorder: Secondary | ICD-10-CM | POA: Diagnosis not present

## 2021-12-06 DIAGNOSIS — E663 Overweight: Secondary | ICD-10-CM | POA: Diagnosis not present

## 2021-12-06 DIAGNOSIS — I2510 Atherosclerotic heart disease of native coronary artery without angina pectoris: Secondary | ICD-10-CM | POA: Diagnosis not present

## 2021-12-06 DIAGNOSIS — J4520 Mild intermittent asthma, uncomplicated: Secondary | ICD-10-CM | POA: Diagnosis not present

## 2022-01-31 ENCOUNTER — Ambulatory Visit: Payer: Medicare Other | Admitting: Cardiology

## 2022-02-19 DIAGNOSIS — J0100 Acute maxillary sinusitis, unspecified: Secondary | ICD-10-CM | POA: Diagnosis not present

## 2022-03-13 ENCOUNTER — Encounter: Payer: Self-pay | Admitting: Podiatry

## 2022-03-13 ENCOUNTER — Ambulatory Visit (INDEPENDENT_AMBULATORY_CARE_PROVIDER_SITE_OTHER): Payer: Medicare Other | Admitting: Podiatry

## 2022-03-13 DIAGNOSIS — B351 Tinea unguium: Secondary | ICD-10-CM | POA: Diagnosis not present

## 2022-03-13 DIAGNOSIS — B353 Tinea pedis: Secondary | ICD-10-CM | POA: Diagnosis not present

## 2022-03-13 DIAGNOSIS — N400 Enlarged prostate without lower urinary tract symptoms: Secondary | ICD-10-CM | POA: Insufficient documentation

## 2022-03-13 DIAGNOSIS — N451 Epididymitis: Secondary | ICD-10-CM | POA: Insufficient documentation

## 2022-03-13 DIAGNOSIS — N179 Acute kidney failure, unspecified: Secondary | ICD-10-CM | POA: Insufficient documentation

## 2022-03-13 DIAGNOSIS — N139 Obstructive and reflux uropathy, unspecified: Secondary | ICD-10-CM | POA: Insufficient documentation

## 2022-03-13 MED ORDER — CLOTRIMAZOLE-BETAMETHASONE 1-0.05 % EX CREA: 1.0000 | TOPICAL_CREAM | Freq: Two times a day (BID) | CUTANEOUS | 0 refills | Status: AC

## 2022-03-13 NOTE — Progress Notes (Signed)
  Subjective:  Patient ID: Corey Woods, male    DOB: October 17, 1945,  MRN: [id number]  Chief Complaint  Patient presents with   Nail Problem    np//Laser consult for toe nail fungus    77 y.o. male presents with the above complaint. History confirmed with patient.  Laser was previously helpful before.  He noticed discoloration worsening over the last several months and is increasing again.  He would like to restart treatment.  He also notes cracking dry scaling skin especially in the back of the heel  Objective:  Physical Exam: warm, good capillary refill, no trophic changes or ulcerative lesions, normal DP and PT pulses, normal sensory exam, onychomycosis, and tinea pedis.        Assessment:   1. Onychomycosis   2. Tinea pedis of both feet      Plan:  Patient was evaluated and treated and all questions answered.  Discussed treatment options for his onychomycosis.  He has previously had good success with laser therapy.  He will be scheduled for this as soon as possible.  We discussed the risk benefits and limitations of this.  He also notes cracking dry scaling skin on the plantar lateral heel but this appeared to be consistent with tinea pedis.  I recommended topical treatment with Lotrisone cream.  This was sent to his pharmacy to use twice daily as needed.  Return in about 6 months (around 09/13/2022) for follow up after nail fungus treatment.

## 2022-03-19 ENCOUNTER — Ambulatory Visit: Payer: Medicare Other

## 2022-03-19 DIAGNOSIS — B351 Tinea unguium: Secondary | ICD-10-CM

## 2022-03-19 NOTE — Patient Instructions (Signed)

## 2022-03-19 NOTE — Progress Notes (Signed)
Patient presents today for the 1st laser treatment. Diagnosed with mycotic nail infection by Dr. Sherryle Lis.   Toenail most affected bilateral 1st.  All other systems are negative.  Nails were filed thin. Laser therapy was administered to 1-5 toenails bilateral and patient tolerated the treatment well. All safety precautions were in place.   Single laser pass was done on non-affected nails.   Follow up in 4 weeks for laser # 2.  Picture of nails taken today to document visual progress

## 2022-03-20 DIAGNOSIS — R413 Other amnesia: Secondary | ICD-10-CM | POA: Diagnosis not present

## 2022-03-20 DIAGNOSIS — F331 Major depressive disorder, recurrent, moderate: Secondary | ICD-10-CM | POA: Diagnosis not present

## 2022-03-20 DIAGNOSIS — N390 Urinary tract infection, site not specified: Secondary | ICD-10-CM | POA: Diagnosis not present

## 2022-03-23 DIAGNOSIS — N401 Enlarged prostate with lower urinary tract symptoms: Secondary | ICD-10-CM | POA: Diagnosis not present

## 2022-03-23 DIAGNOSIS — R948 Abnormal results of function studies of other organs and systems: Secondary | ICD-10-CM | POA: Diagnosis not present

## 2022-03-23 DIAGNOSIS — R350 Frequency of micturition: Secondary | ICD-10-CM | POA: Diagnosis not present

## 2022-03-23 DIAGNOSIS — R351 Nocturia: Secondary | ICD-10-CM | POA: Diagnosis not present

## 2022-03-23 DIAGNOSIS — E291 Testicular hypofunction: Secondary | ICD-10-CM | POA: Diagnosis not present

## 2022-04-02 ENCOUNTER — Other Ambulatory Visit: Payer: Self-pay

## 2022-04-02 MED ORDER — ATORVASTATIN CALCIUM 10 MG PO TABS: 10.0000 mg | ORAL_TABLET | Freq: Every day | ORAL | 1 refills | Status: DC

## 2022-04-05 DIAGNOSIS — N5201 Erectile dysfunction due to arterial insufficiency: Secondary | ICD-10-CM | POA: Diagnosis not present

## 2022-04-16 DIAGNOSIS — I10 Essential (primary) hypertension: Secondary | ICD-10-CM | POA: Diagnosis not present

## 2022-04-16 DIAGNOSIS — R7303 Prediabetes: Secondary | ICD-10-CM | POA: Diagnosis not present

## 2022-04-16 DIAGNOSIS — E782 Mixed hyperlipidemia: Secondary | ICD-10-CM | POA: Diagnosis not present

## 2022-04-16 DIAGNOSIS — E039 Hypothyroidism, unspecified: Secondary | ICD-10-CM | POA: Diagnosis not present

## 2022-04-16 DIAGNOSIS — E291 Testicular hypofunction: Secondary | ICD-10-CM | POA: Diagnosis not present

## 2022-04-20 ENCOUNTER — Ambulatory Visit (INDEPENDENT_AMBULATORY_CARE_PROVIDER_SITE_OTHER): Payer: Medicare Other

## 2022-04-20 DIAGNOSIS — B351 Tinea unguium: Secondary | ICD-10-CM

## 2022-04-20 NOTE — Progress Notes (Signed)
Patient presents today for the 2nd laser treatment. Diagnosed with mycotic nail infection by Dr. Lilian Kapur.   Toenail most affected bilateral 1st.  All other systems are negative.  Nails were filed thin. Laser therapy was administered to 1-5 toenails bilateral and patient tolerated the treatment well. All safety precautions were in place.   Single laser pass was done on non-affected nails.   Follow up in 4 weeks for laser # 3.

## 2022-04-23 ENCOUNTER — Encounter: Payer: Self-pay | Admitting: Internal Medicine

## 2022-04-23 DIAGNOSIS — E1122 Type 2 diabetes mellitus with diabetic chronic kidney disease: Secondary | ICD-10-CM | POA: Diagnosis not present

## 2022-04-23 DIAGNOSIS — J4520 Mild intermittent asthma, uncomplicated: Secondary | ICD-10-CM | POA: Diagnosis not present

## 2022-04-23 DIAGNOSIS — R350 Frequency of micturition: Secondary | ICD-10-CM | POA: Diagnosis not present

## 2022-04-23 DIAGNOSIS — E291 Testicular hypofunction: Secondary | ICD-10-CM | POA: Diagnosis not present

## 2022-04-23 DIAGNOSIS — N529 Male erectile dysfunction, unspecified: Secondary | ICD-10-CM | POA: Diagnosis not present

## 2022-04-23 DIAGNOSIS — N401 Enlarged prostate with lower urinary tract symptoms: Secondary | ICD-10-CM | POA: Diagnosis not present

## 2022-04-23 DIAGNOSIS — E782 Mixed hyperlipidemia: Secondary | ICD-10-CM | POA: Diagnosis not present

## 2022-04-23 DIAGNOSIS — N1831 Chronic kidney disease, stage 3a: Secondary | ICD-10-CM | POA: Diagnosis not present

## 2022-04-23 DIAGNOSIS — I10 Essential (primary) hypertension: Secondary | ICD-10-CM | POA: Diagnosis not present

## 2022-04-23 DIAGNOSIS — E663 Overweight: Secondary | ICD-10-CM | POA: Diagnosis not present

## 2022-04-23 DIAGNOSIS — R351 Nocturia: Secondary | ICD-10-CM | POA: Diagnosis not present

## 2022-04-23 DIAGNOSIS — I2510 Atherosclerotic heart disease of native coronary artery without angina pectoris: Secondary | ICD-10-CM | POA: Diagnosis not present

## 2022-04-23 DIAGNOSIS — G4733 Obstructive sleep apnea (adult) (pediatric): Secondary | ICD-10-CM | POA: Diagnosis not present

## 2022-04-23 DIAGNOSIS — D509 Iron deficiency anemia, unspecified: Secondary | ICD-10-CM | POA: Diagnosis not present

## 2022-04-23 DIAGNOSIS — N5201 Erectile dysfunction due to arterial insufficiency: Secondary | ICD-10-CM | POA: Diagnosis not present

## 2022-04-23 DIAGNOSIS — F319 Bipolar disorder, unspecified: Secondary | ICD-10-CM | POA: Diagnosis not present

## 2022-04-24 NOTE — Progress Notes (Unsigned)
    Cardiology Office Note  Date: 04/25/2022   ID: Corey, Woods December 27, 1945, MRN [id number]  History of Present Illness: Corey Woods is a 77 y.o. male last seen in August 2023 by Ms. Strader PA-C, I reviewed the note.  He is here for a routine visit.  Reports no angina, stable NYHA class I dyspnea, no palpitations or syncope.  He is exercising at the [facility] 2 to 3 days a week at this point.  Has had some sciatica, otherwise no exertional symptoms.  States that he is moving to [place] next month.  Plans to establish with a cardiology practice in that area.  We went over his medications.  Continues on Lipitor at 10 mg daily, discussed increasing this to 20 mg daily if tolerated.  His most recent LDL was 68.  Otherwise he remains on low-dose aspirin.  No spontaneous bleeding problems.  ECG today shows sinus rhythm with nonspecific T wave changes.  Physical Exam: VS:  BP 130/70   Pulse 65   Ht  (1.778 m)   Wt 213 lb 3.2 oz (96.7 kg)   SpO2 96%   BMI 30.59 kg/m , BMI Body mass index is 30.59 kg/m.  Wt Readings from Last 3 Encounters:  04/25/22 213 lb 3.2 oz (96.7 kg)  08/04/21 223 lb (101.2 kg)  04/17/21 226 lb 13.7 oz (102.9 kg)    General: Patient appears comfortable at rest. HEENT: Conjunctiva and lids normal. Neck: Supple, no elevated JVP or carotid bruits. Lungs: Clear to auscultation, nonlabored breathing at rest. Cardiac: Regular rate and rhythm, no S3 or significant systolic murmur. Extremities: No pitting edema.  ECG:  An ECG dated 12/06/2020 was personally reviewed today and demonstrated:  Sinus rhythm with nonspecific T wave changes.  Labwork:  April 2024: Hemoglobin 14.0, platelets 228, BUN 30, creatinine 1.49, potassium 4.5, AST 20, ALT 19, cholesterol 136, triglycerides 104, HDL 49, LDL 68, TSH 2.57, hemoglobin A1c 5.7%  Other Studies Reviewed Today:  Echocardiogram 04/05/2021:  1. Left ventricular ejection fraction, by  estimation, is 55 to 60%. The  left ventricle has normal function. The left ventricle has no regional  wall motion abnormalities. There is mild left ventricular hypertrophy.   2. Right ventricular systolic function is normal. The right ventricular  size is normal.   3. Limited echo to evaluate LV function   Assessment and Plan:  1.  Multivessel CAD status post previous reported PCI in [place], more recently CABG in November 2022 including LIMA to LAD, SVG to PDA, SVG to OM 2, and SVG to D2.  LVEF 55 to 60% by echocardiogram in April 2023.  He reports no angina, exercising 2 to 3 days a week at the [facility].  Continue medical therapy including aspirin and Lipitor.  He plans to move to [facility] next month and will establish with a cardiology practice in that area.  2.  Mixed hyperlipidemia.  He remains on Lipitor with recent LDL 68.  Increase Lipitor to 20 mg daily for now.  3.  CKD stage IIIb.  Recent creatinine 1.49.  Disposition:  Follow up  6 months unless he establishes with cardiology practice in [place] in the interim.  Signed, Jonelle Sidle, M.D., F.A.C.C. [facility]

## 2022-04-25 ENCOUNTER — Ambulatory Visit: Payer: Medicare Other | Attending: Cardiology | Admitting: Cardiology

## 2022-04-25 ENCOUNTER — Encounter: Payer: Self-pay | Admitting: Cardiology

## 2022-04-25 VITALS — BP 130/70 | HR 65 | Ht 70.0 in | Wt 213.2 lb

## 2022-04-25 DIAGNOSIS — I25119 Atherosclerotic heart disease of native coronary artery with unspecified angina pectoris: Secondary | ICD-10-CM | POA: Insufficient documentation

## 2022-04-25 DIAGNOSIS — E782 Mixed hyperlipidemia: Secondary | ICD-10-CM | POA: Insufficient documentation

## 2022-04-25 MED ORDER — ATORVASTATIN CALCIUM 20 MG PO TABS: 20.0000 mg | ORAL_TABLET | Freq: Every day | ORAL | 3 refills | Status: AC

## 2022-04-25 NOTE — Patient Instructions (Signed)
Medication Instructions:  Your physician has recommended you make the following change in your medication:   -Increase Lipitor (Atorvastatin) to 20 mg tablet once daily.   *If you need a refill on your cardiac medications before your next appointment, please call your pharmacy*   Lab Work: None If you have labs (blood work) drawn today and your tests are completely normal, you will receive your results only by: MyChart Message (if you have MyChart) OR A paper copy in the mail If you have any lab test that is abnormal or we need to change your treatment, we will call you to review the results.   Testing/Procedures: None   Follow-Up: At [facility], you and your health needs are our priority.  As part of our continuing mission to provide you with exceptional heart care, we have created designated Provider Care Teams.  These Care Teams include your primary Cardiologist (physician) and Advanced Practice Providers (APPs -  Physician Assistants and Nurse Practitioners) who all work together to provide you with the care you need, when you need it.  We recommend signing up for the patient portal called "MyChart".  Sign up information is provided on this After Visit Summary.  MyChart is used to connect with patients for Virtual Visits (Telemedicine).  Patients are able to view lab/test results, encounter notes, upcoming appointments, etc.  Non-urgent messages can be sent to your provider as well.   To learn more about what you can do with MyChart, go to [email or web address].    Your next appointment:   6 month(s)  Provider:   Nona Dell, MD    Other Instructions

## 2022-05-08 DIAGNOSIS — M1711 Unilateral primary osteoarthritis, right knee: Secondary | ICD-10-CM | POA: Diagnosis not present

## 2022-05-15 DIAGNOSIS — M1711 Unilateral primary osteoarthritis, right knee: Secondary | ICD-10-CM | POA: Diagnosis not present

## 2022-05-18 ENCOUNTER — Other Ambulatory Visit: Payer: Medicare Other

## 2022-06-19 DIAGNOSIS — M5416 Radiculopathy, lumbar region: Secondary | ICD-10-CM | POA: Diagnosis not present

## 2022-06-19 DIAGNOSIS — M545 Low back pain, unspecified: Secondary | ICD-10-CM | POA: Diagnosis not present

## 2022-06-19 DIAGNOSIS — M7918 Myalgia, other site: Secondary | ICD-10-CM | POA: Diagnosis not present

## 2022-06-20 DIAGNOSIS — M545 Low back pain, unspecified: Secondary | ICD-10-CM | POA: Diagnosis not present

## 2022-06-20 DIAGNOSIS — R2689 Other abnormalities of gait and mobility: Secondary | ICD-10-CM | POA: Diagnosis not present

## 2022-06-20 DIAGNOSIS — M5416 Radiculopathy, lumbar region: Secondary | ICD-10-CM | POA: Diagnosis not present

## 2022-06-21 DIAGNOSIS — M545 Low back pain, unspecified: Secondary | ICD-10-CM | POA: Diagnosis not present

## 2022-06-21 DIAGNOSIS — M5416 Radiculopathy, lumbar region: Secondary | ICD-10-CM | POA: Diagnosis not present

## 2022-06-21 DIAGNOSIS — R2689 Other abnormalities of gait and mobility: Secondary | ICD-10-CM | POA: Diagnosis not present

## 2022-06-25 DIAGNOSIS — M5416 Radiculopathy, lumbar region: Secondary | ICD-10-CM | POA: Diagnosis not present

## 2022-06-25 DIAGNOSIS — M545 Low back pain, unspecified: Secondary | ICD-10-CM | POA: Diagnosis not present

## 2022-06-25 DIAGNOSIS — R2689 Other abnormalities of gait and mobility: Secondary | ICD-10-CM | POA: Diagnosis not present

## 2022-06-27 DIAGNOSIS — M5416 Radiculopathy, lumbar region: Secondary | ICD-10-CM | POA: Diagnosis not present

## 2022-06-27 DIAGNOSIS — M545 Low back pain, unspecified: Secondary | ICD-10-CM | POA: Diagnosis not present

## 2022-06-27 DIAGNOSIS — R2689 Other abnormalities of gait and mobility: Secondary | ICD-10-CM | POA: Diagnosis not present

## 2022-07-02 DIAGNOSIS — M5416 Radiculopathy, lumbar region: Secondary | ICD-10-CM | POA: Diagnosis not present

## 2022-07-02 DIAGNOSIS — R2689 Other abnormalities of gait and mobility: Secondary | ICD-10-CM | POA: Diagnosis not present

## 2022-07-02 DIAGNOSIS — M545 Low back pain, unspecified: Secondary | ICD-10-CM | POA: Diagnosis not present

## 2022-07-04 DIAGNOSIS — M545 Low back pain, unspecified: Secondary | ICD-10-CM | POA: Diagnosis not present

## 2022-07-04 DIAGNOSIS — R2689 Other abnormalities of gait and mobility: Secondary | ICD-10-CM | POA: Diagnosis not present

## 2022-07-04 DIAGNOSIS — M5416 Radiculopathy, lumbar region: Secondary | ICD-10-CM | POA: Diagnosis not present

## 2022-07-10 DIAGNOSIS — M5416 Radiculopathy, lumbar region: Secondary | ICD-10-CM | POA: Diagnosis not present

## 2022-07-10 DIAGNOSIS — M545 Low back pain, unspecified: Secondary | ICD-10-CM | POA: Diagnosis not present

## 2022-07-10 DIAGNOSIS — R2689 Other abnormalities of gait and mobility: Secondary | ICD-10-CM | POA: Diagnosis not present

## 2022-07-12 DIAGNOSIS — R2689 Other abnormalities of gait and mobility: Secondary | ICD-10-CM | POA: Diagnosis not present

## 2022-07-12 DIAGNOSIS — M5416 Radiculopathy, lumbar region: Secondary | ICD-10-CM | POA: Diagnosis not present

## 2022-07-12 DIAGNOSIS — M545 Low back pain, unspecified: Secondary | ICD-10-CM | POA: Diagnosis not present

## 2022-07-16 DIAGNOSIS — M545 Low back pain, unspecified: Secondary | ICD-10-CM | POA: Diagnosis not present

## 2022-07-16 DIAGNOSIS — R2689 Other abnormalities of gait and mobility: Secondary | ICD-10-CM | POA: Diagnosis not present

## 2022-07-16 DIAGNOSIS — M5416 Radiculopathy, lumbar region: Secondary | ICD-10-CM | POA: Diagnosis not present

## 2022-07-18 DIAGNOSIS — M5416 Radiculopathy, lumbar region: Secondary | ICD-10-CM | POA: Diagnosis not present

## 2022-07-18 DIAGNOSIS — R2689 Other abnormalities of gait and mobility: Secondary | ICD-10-CM | POA: Diagnosis not present

## 2022-07-18 DIAGNOSIS — M545 Low back pain, unspecified: Secondary | ICD-10-CM | POA: Diagnosis not present

## 2022-07-25 DIAGNOSIS — M545 Low back pain, unspecified: Secondary | ICD-10-CM | POA: Diagnosis not present

## 2022-07-25 DIAGNOSIS — R2689 Other abnormalities of gait and mobility: Secondary | ICD-10-CM | POA: Diagnosis not present

## 2022-07-25 DIAGNOSIS — M5416 Radiculopathy, lumbar region: Secondary | ICD-10-CM | POA: Diagnosis not present

## 2022-07-27 DIAGNOSIS — Z87891 Personal history of nicotine dependence: Secondary | ICD-10-CM | POA: Diagnosis not present

## 2022-07-27 DIAGNOSIS — N3281 Overactive bladder: Secondary | ICD-10-CM | POA: Diagnosis not present

## 2022-07-27 DIAGNOSIS — F5221 Male erectile disorder: Secondary | ICD-10-CM | POA: Diagnosis not present

## 2022-07-27 DIAGNOSIS — F331 Major depressive disorder, recurrent, moderate: Secondary | ICD-10-CM | POA: Diagnosis not present

## 2022-07-27 DIAGNOSIS — E039 Hypothyroidism, unspecified: Secondary | ICD-10-CM | POA: Diagnosis not present

## 2022-07-27 DIAGNOSIS — M5126 Other intervertebral disc displacement, lumbar region: Secondary | ICD-10-CM | POA: Diagnosis not present

## 2022-07-27 DIAGNOSIS — G4700 Insomnia, unspecified: Secondary | ICD-10-CM | POA: Diagnosis not present

## 2022-07-27 DIAGNOSIS — Z951 Presence of aortocoronary bypass graft: Secondary | ICD-10-CM | POA: Diagnosis not present

## 2022-07-27 DIAGNOSIS — M5416 Radiculopathy, lumbar region: Secondary | ICD-10-CM | POA: Diagnosis not present

## 2022-07-27 DIAGNOSIS — R2689 Other abnormalities of gait and mobility: Secondary | ICD-10-CM | POA: Diagnosis not present

## 2022-07-27 DIAGNOSIS — R7303 Prediabetes: Secondary | ICD-10-CM | POA: Diagnosis not present

## 2022-07-27 DIAGNOSIS — J309 Allergic rhinitis, unspecified: Secondary | ICD-10-CM | POA: Diagnosis not present

## 2022-07-27 DIAGNOSIS — R891 Abnormal level of hormones in specimens from other organs, systems and tissues: Secondary | ICD-10-CM | POA: Diagnosis not present

## 2022-07-27 DIAGNOSIS — M545 Low back pain, unspecified: Secondary | ICD-10-CM | POA: Diagnosis not present

## 2022-07-27 DIAGNOSIS — I2510 Atherosclerotic heart disease of native coronary artery without angina pectoris: Secondary | ICD-10-CM | POA: Diagnosis not present

## 2022-07-31 DIAGNOSIS — M5416 Radiculopathy, lumbar region: Secondary | ICD-10-CM | POA: Diagnosis not present

## 2022-08-01 DIAGNOSIS — H02834 Dermatochalasis of left upper eyelid: Secondary | ICD-10-CM | POA: Diagnosis not present

## 2022-08-01 DIAGNOSIS — H353131 Nonexudative age-related macular degeneration, bilateral, early dry stage: Secondary | ICD-10-CM | POA: Diagnosis not present

## 2022-08-01 DIAGNOSIS — H52222 Regular astigmatism, left eye: Secondary | ICD-10-CM | POA: Diagnosis not present

## 2022-08-01 DIAGNOSIS — H524 Presbyopia: Secondary | ICD-10-CM | POA: Diagnosis not present

## 2022-08-01 DIAGNOSIS — H52223 Regular astigmatism, bilateral: Secondary | ICD-10-CM | POA: Diagnosis not present

## 2022-08-01 DIAGNOSIS — H02831 Dermatochalasis of right upper eyelid: Secondary | ICD-10-CM | POA: Diagnosis not present

## 2022-08-01 DIAGNOSIS — D3101 Benign neoplasm of right conjunctiva: Secondary | ICD-10-CM | POA: Diagnosis not present

## 2022-08-01 DIAGNOSIS — Z961 Presence of intraocular lens: Secondary | ICD-10-CM | POA: Diagnosis not present

## 2022-08-01 DIAGNOSIS — H5203 Hypermetropia, bilateral: Secondary | ICD-10-CM | POA: Diagnosis not present

## 2022-08-15 DIAGNOSIS — M545 Low back pain, unspecified: Secondary | ICD-10-CM | POA: Diagnosis not present

## 2022-08-20 DIAGNOSIS — M5416 Radiculopathy, lumbar region: Secondary | ICD-10-CM | POA: Diagnosis not present

## 2022-09-10 DIAGNOSIS — L739 Follicular disorder, unspecified: Secondary | ICD-10-CM | POA: Diagnosis not present

## 2022-09-11 DIAGNOSIS — N3281 Overactive bladder: Secondary | ICD-10-CM | POA: Diagnosis not present

## 2022-09-11 DIAGNOSIS — N401 Enlarged prostate with lower urinary tract symptoms: Secondary | ICD-10-CM | POA: Diagnosis not present

## 2022-09-11 DIAGNOSIS — R3989 Other symptoms and signs involving the genitourinary system: Secondary | ICD-10-CM | POA: Diagnosis not present

## 2022-09-13 ENCOUNTER — Ambulatory Visit (INDEPENDENT_AMBULATORY_CARE_PROVIDER_SITE_OTHER): Payer: Medicare Other | Admitting: Podiatry

## 2022-09-13 DIAGNOSIS — Z91199 Patient's noncompliance with other medical treatment and regimen due to unspecified reason: Secondary | ICD-10-CM

## 2022-09-16 NOTE — Progress Notes (Signed)
Patient was no-show for appointment today 

## 2022-12-17 ENCOUNTER — Other Ambulatory Visit: Payer: Self-pay | Admitting: Physician Assistant

## 2022-12-26 IMAGING — DX DG CHEST 2V
2 series · 2 of 2 positions shown · non-contrast
Comparison: None.

CLINICAL DATA: Dyspnea with activity worsening over the last few
weeks.

EXAM:
CHEST - 2 VIEW

[chest pa]
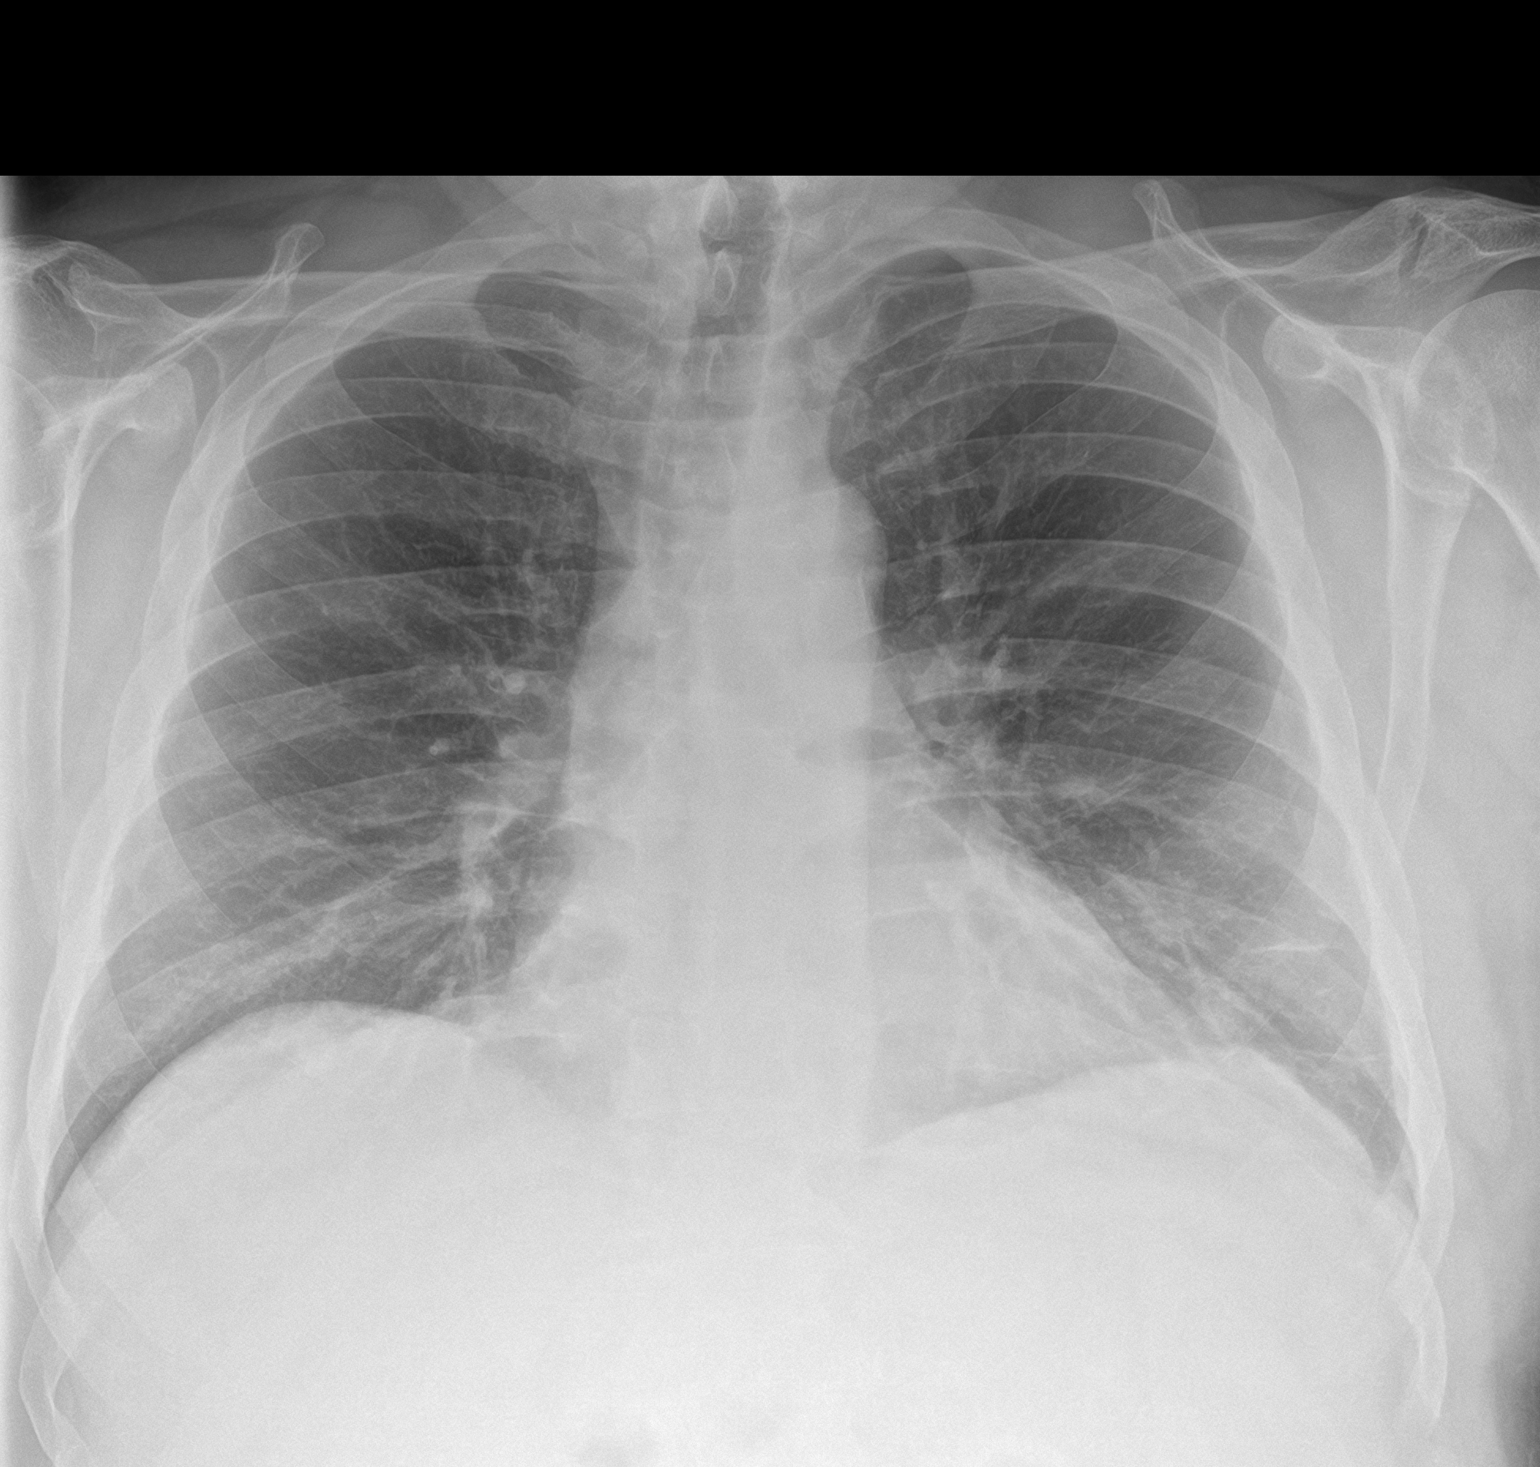

[chest lat]
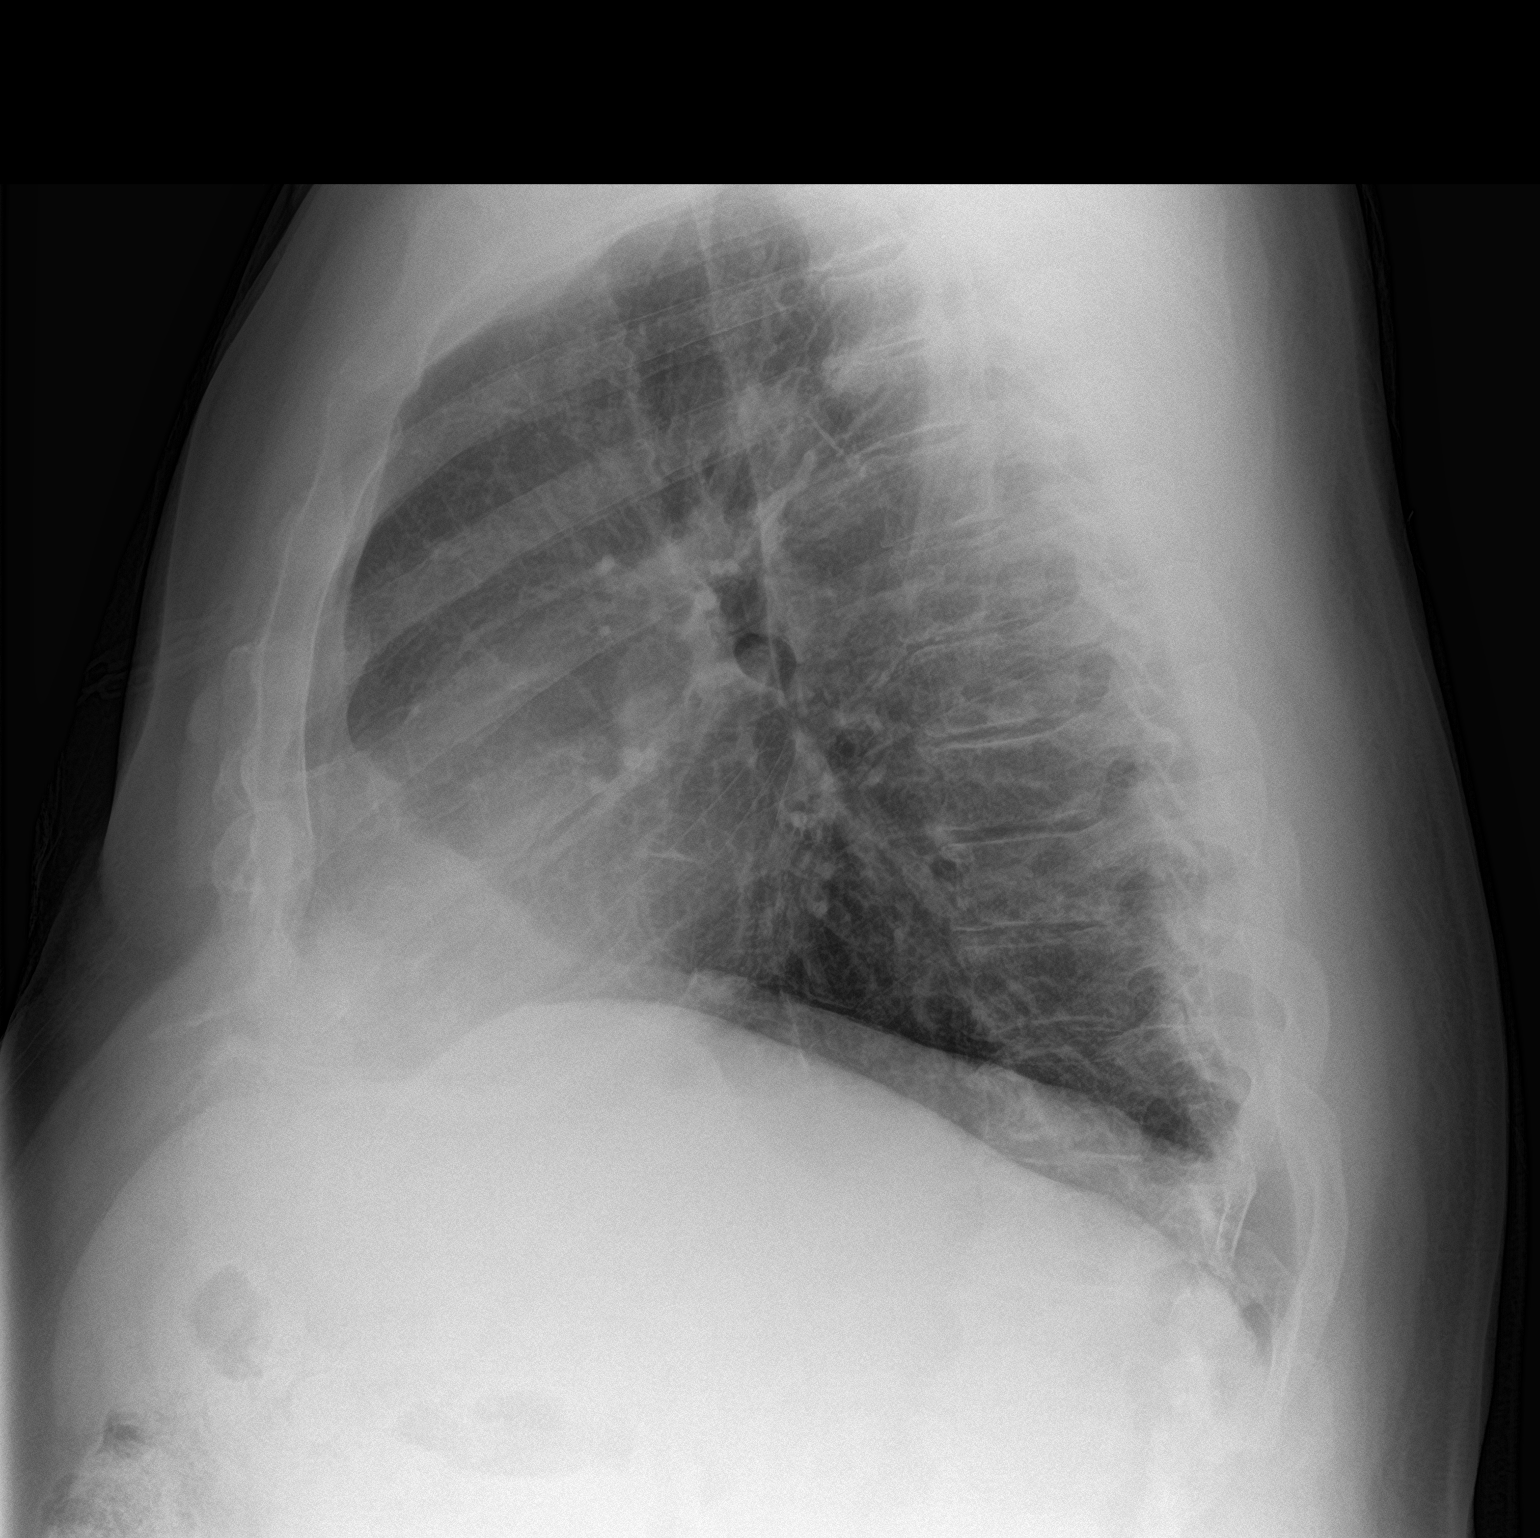

[2 of 2 positions shown; findings below may reference images not displayed]

FINDINGS: Mild left basilar atelectasis. No focal consolidation. No pleural
effusion or pneumothorax. Heart and mediastinal contours are
unremarkable.

No acute osseous abnormality.
IMPRESSION: No active cardiopulmonary disease.

## 2023-01-07 IMAGING — DX DG CHEST 1V PORT
1 series · 1 of 1 positions shown · non-contrast
Comparison: November 25, 2020

CLINICAL DATA: Acute heart failure.

EXAM:
PORTABLE CHEST 1 VIEW

[chest ap]
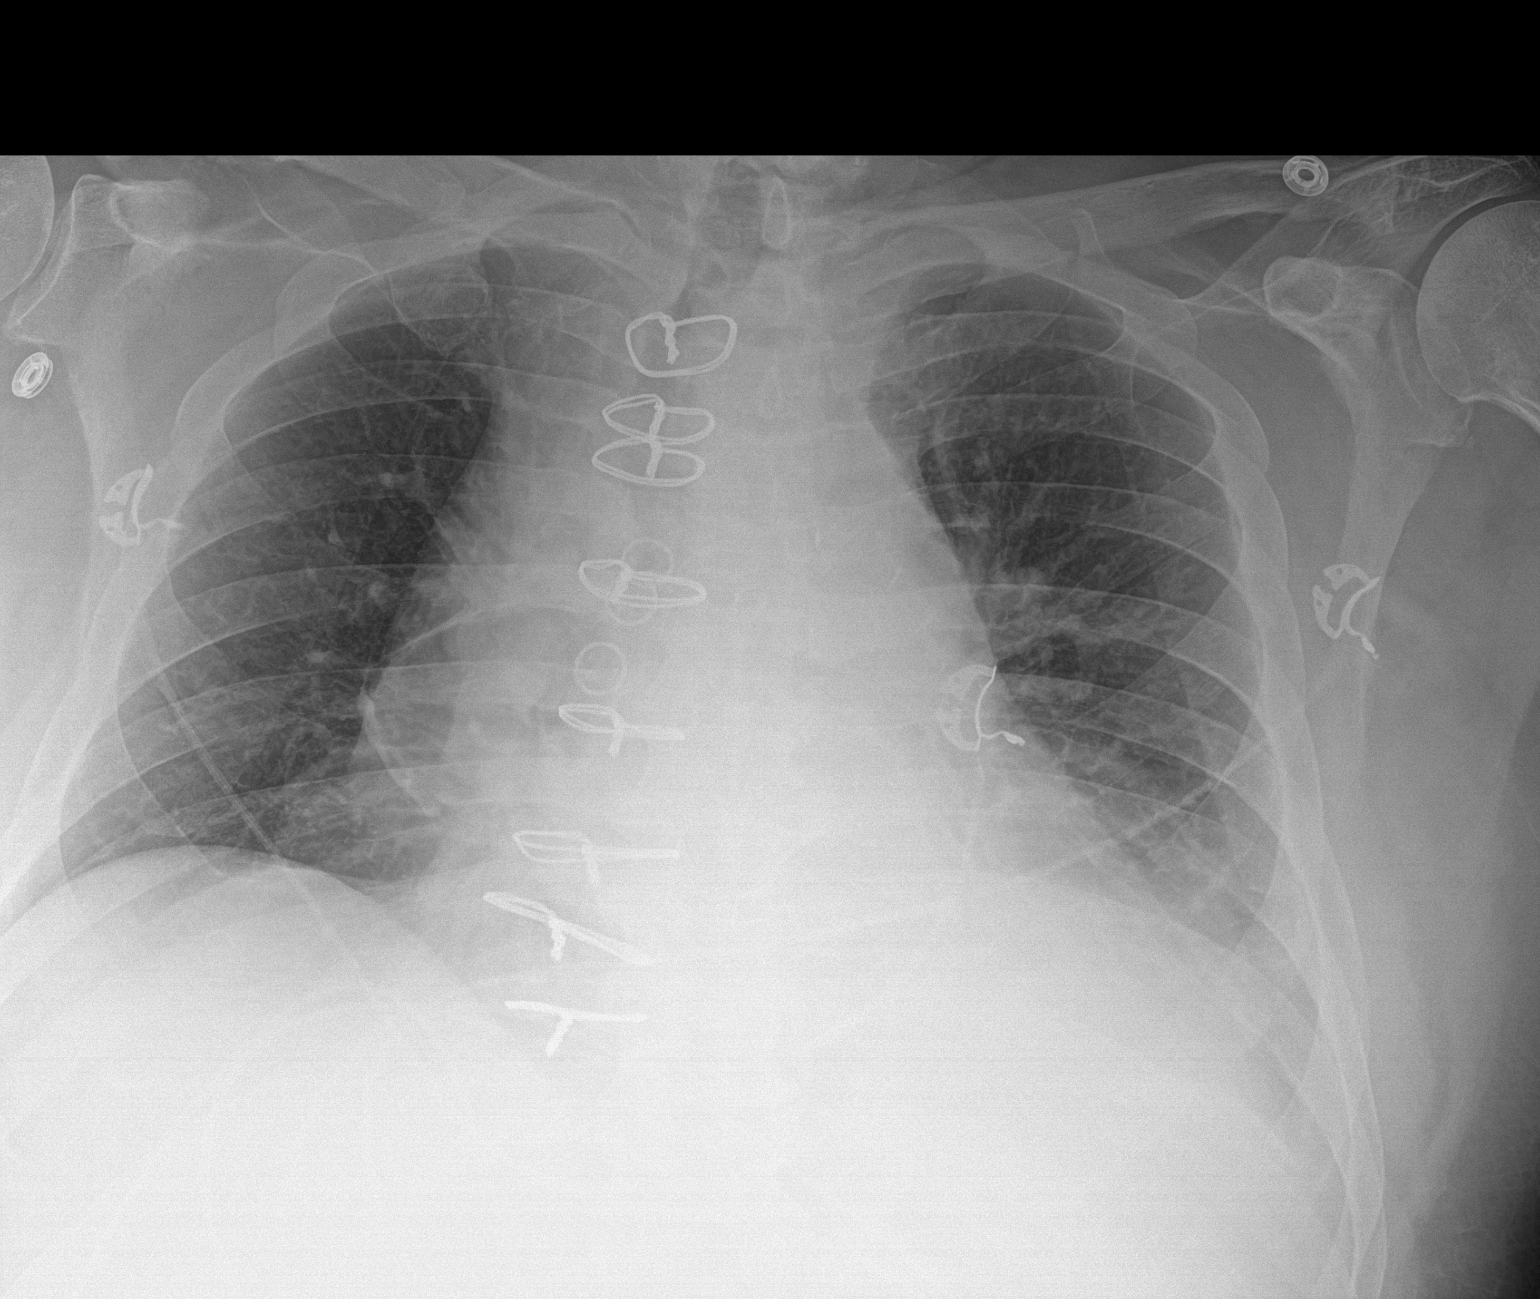

[1 of 1 positions shown; findings below may reference images not displayed]

FINDINGS: Multiple sternal wires are seen. The right-sided venous catheter
noted on the prior study has been removed. Mild right perihilar
linear atelectasis is seen. There is no evidence of a pleural
effusion or pneumothorax. The cardiac silhouette is mildly enlarged
and unchanged in size. Degenerative changes are seen throughout the
thoracic spine.
IMPRESSION: 1. Evidence of prior median sternotomy/CABG.
2. Mild right perihilar linear atelectasis.

## 2023-02-20 IMAGING — DX DG CHEST 2V
2 series · 2 of 2 positions shown · non-contrast
Comparison: November 28, 2020.

CLINICAL DATA: s/p cabg x 4

EXAM:
CHEST - 2 VIEW

[dg chest 2 view (1 of 2)]
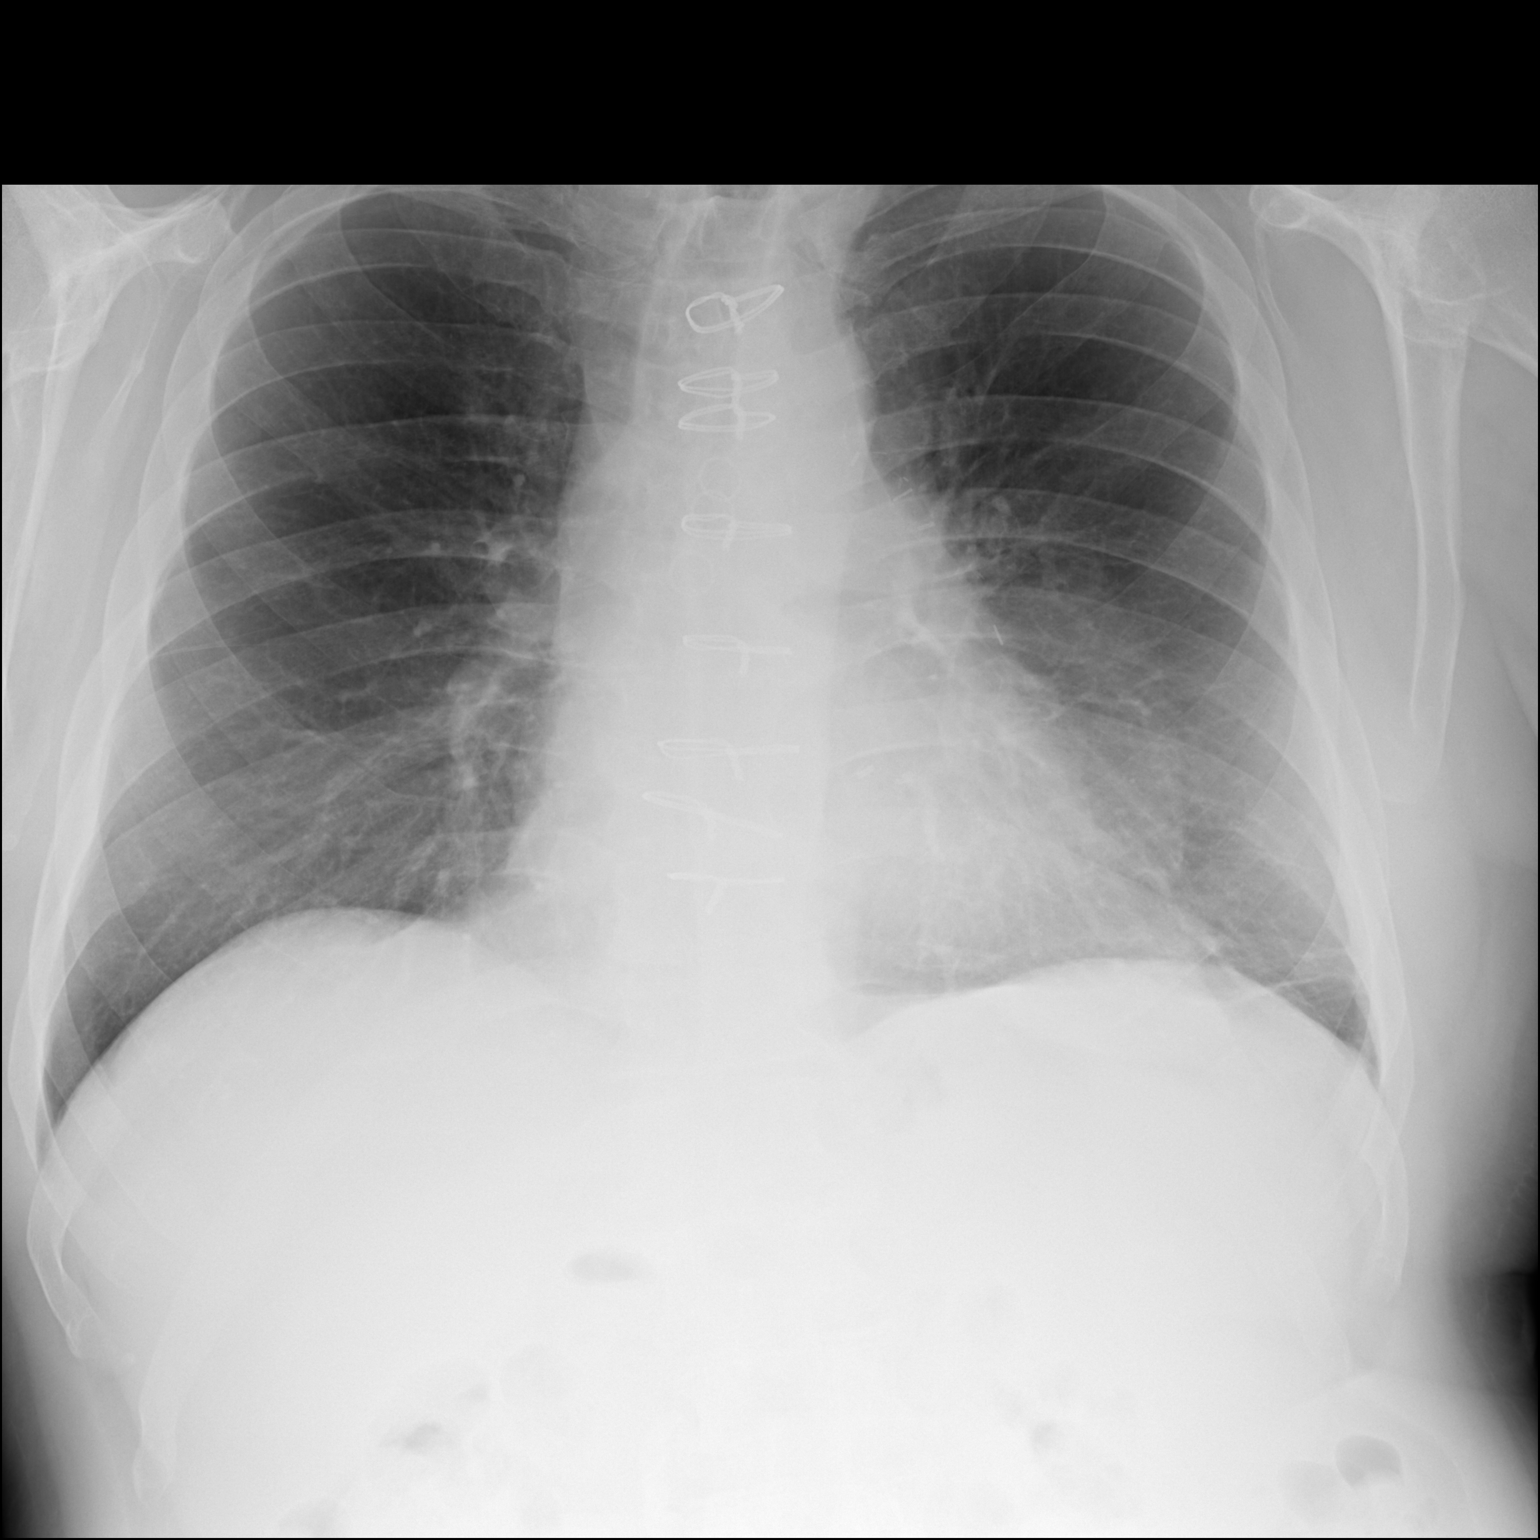

[dg chest 2 view (2 of 2)]
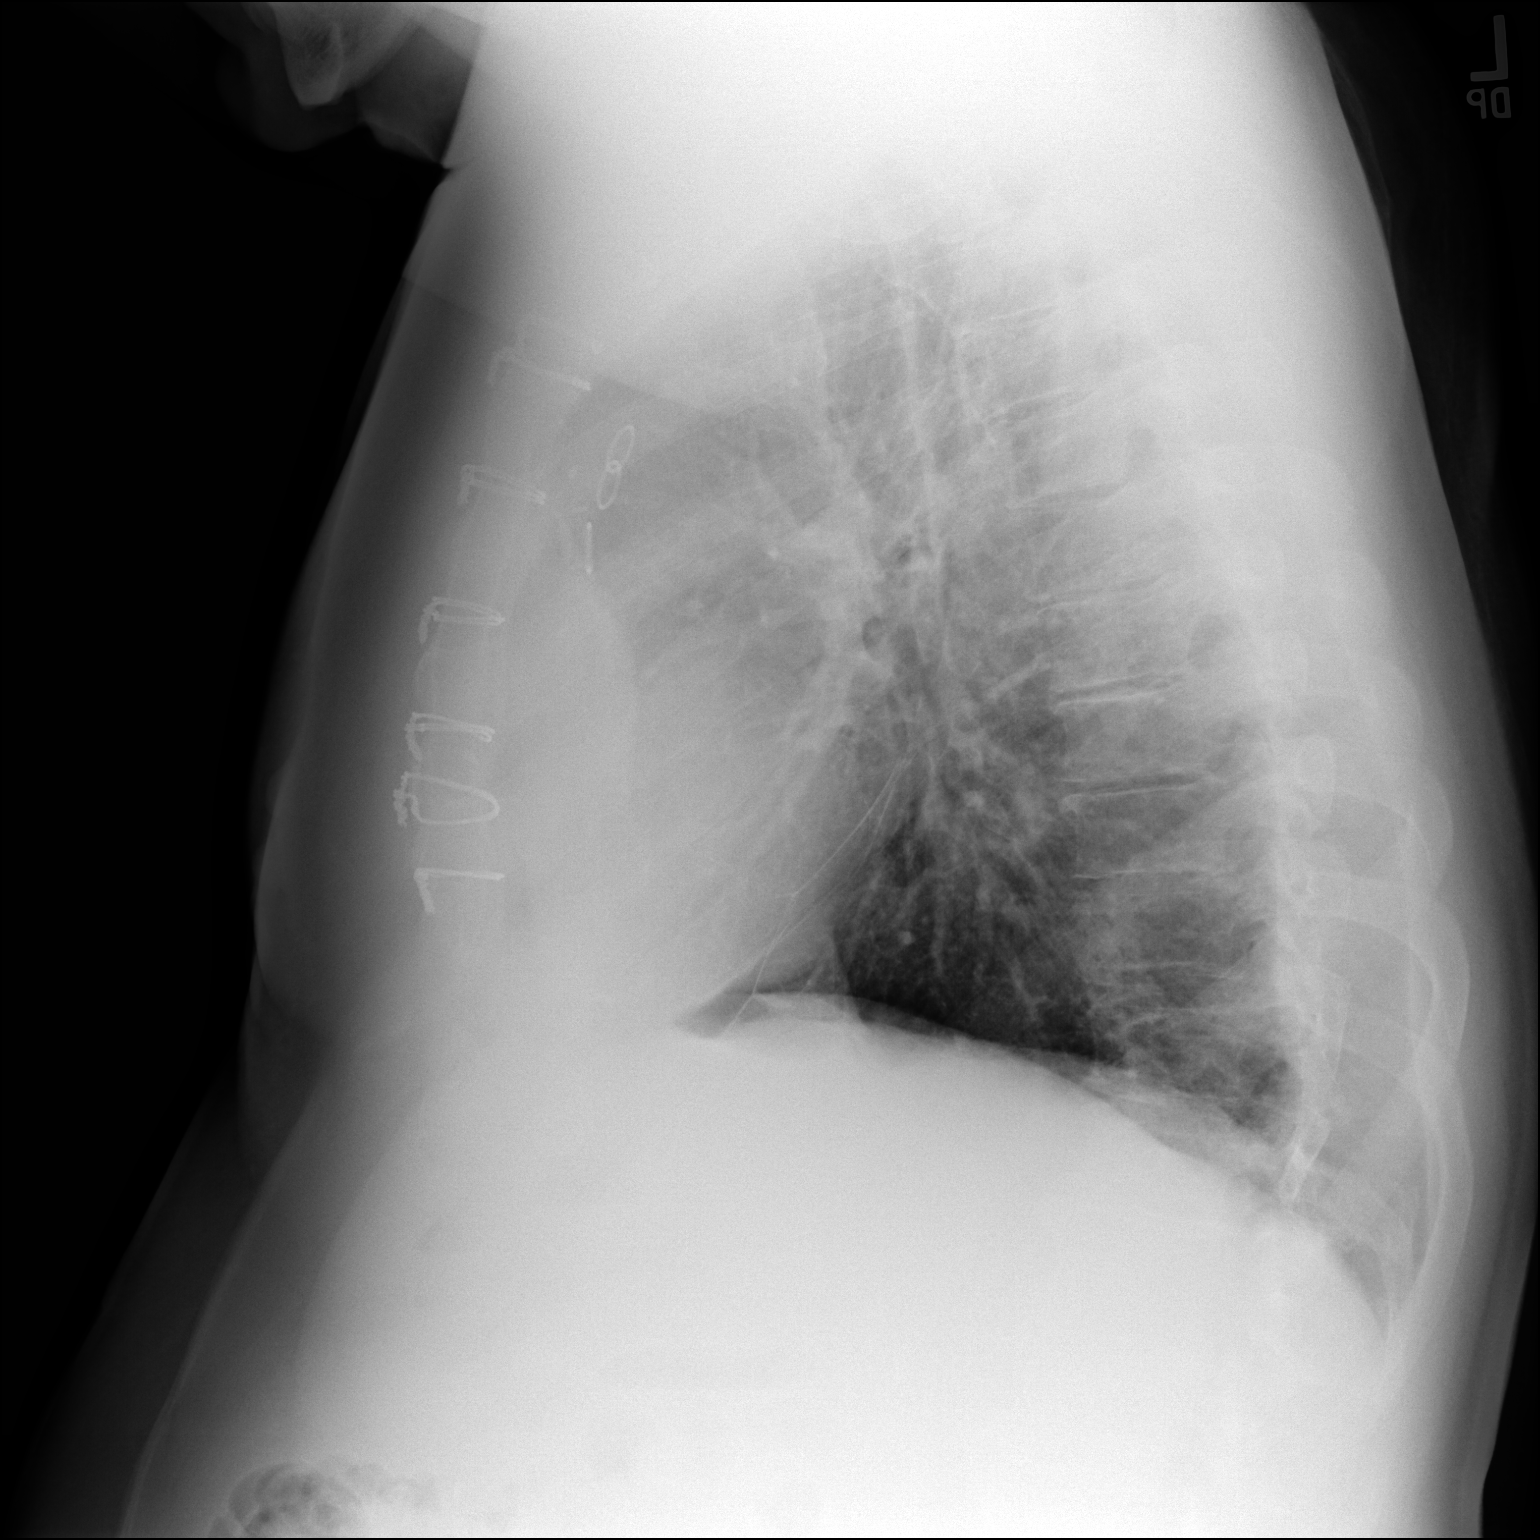

[2 of 2 positions shown; findings below may reference images not displayed]

FINDINGS: Cardiomediastinal silhouette is within normal limits. CABG and
median sternotomy. Improved streaky left basilar opacities. No
visible pleural effusions or pneumothorax.
IMPRESSION: Resolution of the left pleural effusion with improved mild left
basilar opacities, likely atelectasis.
